# Patient Record
Sex: Male | Born: 1945 | Race: White | Hispanic: No | Marital: Married | Smoking: Former smoker
Health system: Southern US, Community
[De-identification: ages and names within clinical notes are randomized; demographics above are authoritative.]

## PROBLEM LIST (undated history)

## (undated) DIAGNOSIS — E119 Type 2 diabetes mellitus without complications: Secondary | ICD-10-CM

## (undated) DIAGNOSIS — Z862 Personal history of diseases of the blood and blood-forming organs and certain disorders involving the immune mechanism: Secondary | ICD-10-CM

## (undated) DIAGNOSIS — Z859 Personal history of malignant neoplasm, unspecified: Secondary | ICD-10-CM

## (undated) DIAGNOSIS — C189 Malignant neoplasm of colon, unspecified: Secondary | ICD-10-CM

## (undated) DIAGNOSIS — Z1509 Genetic susceptibility to other malignant neoplasm: Secondary | ICD-10-CM

## (undated) DIAGNOSIS — Z923 Personal history of irradiation: Secondary | ICD-10-CM

## (undated) DIAGNOSIS — K567 Ileus, unspecified: Secondary | ICD-10-CM

## (undated) DIAGNOSIS — I10 Essential (primary) hypertension: Secondary | ICD-10-CM

## (undated) DIAGNOSIS — D649 Anemia, unspecified: Secondary | ICD-10-CM

## (undated) DIAGNOSIS — Z85828 Personal history of other malignant neoplasm of skin: Secondary | ICD-10-CM

## (undated) DIAGNOSIS — Z85038 Personal history of other malignant neoplasm of large intestine: Secondary | ICD-10-CM

## (undated) DIAGNOSIS — C499 Malignant neoplasm of connective and soft tissue, unspecified: Secondary | ICD-10-CM

## (undated) DIAGNOSIS — L309 Dermatitis, unspecified: Secondary | ICD-10-CM

## (undated) DIAGNOSIS — M1711 Unilateral primary osteoarthritis, right knee: Secondary | ICD-10-CM

## (undated) DIAGNOSIS — C61 Malignant neoplasm of prostate: Secondary | ICD-10-CM

## (undated) DIAGNOSIS — Z9889 Other specified postprocedural states: Secondary | ICD-10-CM

## (undated) DIAGNOSIS — Z973 Presence of spectacles and contact lenses: Secondary | ICD-10-CM

## (undated) DIAGNOSIS — M1990 Unspecified osteoarthritis, unspecified site: Secondary | ICD-10-CM

## (undated) DIAGNOSIS — Z9289 Personal history of other medical treatment: Secondary | ICD-10-CM

## (undated) DIAGNOSIS — E039 Hypothyroidism, unspecified: Secondary | ICD-10-CM

## (undated) DIAGNOSIS — N529 Male erectile dysfunction, unspecified: Secondary | ICD-10-CM

## (undated) HISTORY — DX: Genetic susceptibility to other malignant neoplasm: Z15.09

## (undated) HISTORY — PX: TONSILLECTOMY: SUR1361

## (undated) HISTORY — PX: APPENDECTOMY: SHX54

## (undated) HISTORY — PX: PROSTATE SURGERY: SHX751

## (undated) HISTORY — PX: KNEE ARTHROSCOPY: SUR90

## (undated) HISTORY — DX: Malignant neoplasm of colon, unspecified: C18.9

---

## 1995-09-07 HISTORY — PX: RIGHT COLECTOMY: SHX853

## 1995-11-13 HISTORY — PX: SIGMOIDECTOMY: SHX176

## 1997-12-13 ENCOUNTER — Other Ambulatory Visit: Admission: RE | Admit: 1997-12-13 | Discharge: 1997-12-13 | Payer: Self-pay | Admitting: Gastroenterology

## 1997-12-22 ENCOUNTER — Other Ambulatory Visit: Admission: RE | Admit: 1997-12-22 | Discharge: 1997-12-22 | Payer: Self-pay | Admitting: General Surgery

## 1997-12-28 ENCOUNTER — Ambulatory Visit (HOSPITAL_COMMUNITY): Admission: RE | Admit: 1997-12-28 | Discharge: 1997-12-28 | Payer: Self-pay | Admitting: Gastroenterology

## 1997-12-29 HISTORY — PX: TOTAL COLECTOMY: SHX852

## 1998-01-11 ENCOUNTER — Ambulatory Visit (HOSPITAL_COMMUNITY): Admission: RE | Admit: 1998-01-11 | Discharge: 1998-01-11 | Payer: Self-pay | Admitting: General Surgery

## 1998-03-06 ENCOUNTER — Ambulatory Visit (HOSPITAL_BASED_OUTPATIENT_CLINIC_OR_DEPARTMENT_OTHER): Admission: RE | Admit: 1998-03-06 | Discharge: 1998-03-06 | Payer: Self-pay | Admitting: Plastic Surgery

## 1998-03-08 ENCOUNTER — Ambulatory Visit (HOSPITAL_BASED_OUTPATIENT_CLINIC_OR_DEPARTMENT_OTHER): Admission: RE | Admit: 1998-03-08 | Discharge: 1998-03-08 | Payer: Self-pay | Admitting: Plastic Surgery

## 1998-03-22 ENCOUNTER — Ambulatory Visit (HOSPITAL_BASED_OUTPATIENT_CLINIC_OR_DEPARTMENT_OTHER): Admission: RE | Admit: 1998-03-22 | Discharge: 1998-03-22 | Payer: Self-pay | Admitting: Plastic Surgery

## 1999-12-24 ENCOUNTER — Ambulatory Visit (HOSPITAL_BASED_OUTPATIENT_CLINIC_OR_DEPARTMENT_OTHER): Admission: RE | Admit: 1999-12-24 | Discharge: 1999-12-24 | Payer: Self-pay | Admitting: Orthopedic Surgery

## 2000-12-22 ENCOUNTER — Other Ambulatory Visit: Admission: RE | Admit: 2000-12-22 | Discharge: 2000-12-22 | Payer: Self-pay | Admitting: Gastroenterology

## 2006-05-06 ENCOUNTER — Ambulatory Visit: Admission: RE | Admit: 2006-05-06 | Discharge: 2006-10-23 | Payer: Self-pay | Admitting: Radiation Oncology

## 2006-05-26 HISTORY — PX: OTHER SURGICAL HISTORY: SHX169

## 2006-06-20 ENCOUNTER — Encounter: Admission: RE | Admit: 2006-06-20 | Discharge: 2006-06-20 | Payer: Self-pay | Admitting: Urology

## 2006-06-23 ENCOUNTER — Ambulatory Visit (HOSPITAL_BASED_OUTPATIENT_CLINIC_OR_DEPARTMENT_OTHER): Admission: RE | Admit: 2006-06-23 | Discharge: 2006-06-23 | Payer: Self-pay | Admitting: Urology

## 2013-07-05 ENCOUNTER — Encounter (HOSPITAL_COMMUNITY)
Admission: RE | Disposition: A | Discharge status: Home / Self Care | Payer: Self-pay | Source: Ambulatory Visit | Attending: Gastroenterology

## 2013-07-05 ENCOUNTER — Ambulatory Visit (HOSPITAL_COMMUNITY)
Admission: RE | Admit: 2013-07-05 | Discharge: 2013-07-05 | Disposition: A | Discharge status: Home / Self Care | Payer: Medicare Other | Source: Ambulatory Visit | Attending: Gastroenterology | Admitting: Gastroenterology

## 2013-07-05 DIAGNOSIS — Z9049 Acquired absence of other specified parts of digestive tract: Secondary | ICD-10-CM | POA: Insufficient documentation

## 2013-07-05 DIAGNOSIS — D509 Iron deficiency anemia, unspecified: Secondary | ICD-10-CM | POA: Insufficient documentation

## 2013-07-05 DIAGNOSIS — Z85038 Personal history of other malignant neoplasm of large intestine: Secondary | ICD-10-CM | POA: Insufficient documentation

## 2013-07-05 HISTORY — PX: GIVENS CAPSULE STUDY: SHX5432

## 2013-07-05 SURGERY — IMAGING PROCEDURE, GI TRACT, INTRALUMINAL, VIA CAPSULE
Anesthesia: LOCAL

## 2013-07-05 SURGICAL SUPPLY — 1 items: TOWEL COTTON PACK 4EA (MISCELLANEOUS) ×4 IMPLANT

## 2013-07-06 ENCOUNTER — Encounter (HOSPITAL_COMMUNITY): Payer: Self-pay | Admitting: Gastroenterology

## 2013-07-12 NOTE — H&P (Signed)
  Problem: Unexplained iron deficiency anemia.  History: The patient is a 67 year old male born [birth year] with unexplained iron deficiency anemia. He has undergone a subtotal colectomy for colon cancer. Surveillance flexible proctosigmoidoscopy, esophagogastroduodenoscopy, and small bowel biopsies were normal.  The patient is scheduled to undergo a diagnostic capsule endoscopy.  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 Type 2 diabetes mellitus. Primary hypothyroidism. Hypertension. Vertigo. Osteoarthritis. Eczema. Hypercholesterolemia. Benign prostatic hypertrophy. Prostate cancer. Carpal tunnel syndrome. Obstructive sleep apnea syndrome. Squamous cell skin cancer. Tonsillectomy. Appendectomy. Subtotal colectomy. Arthroscopic knee surgery.  Medication allergies: Lipitor and Zetia  Plan: Proceed with diagnostic capsule endoscopy to evaluate unexplained iron deficiency anemia.

## 2013-07-30 ENCOUNTER — Other Ambulatory Visit: Payer: Self-pay | Admitting: Urology

## 2013-08-03 ENCOUNTER — Other Ambulatory Visit: Payer: Self-pay | Admitting: Gastroenterology

## 2013-08-03 ENCOUNTER — Ambulatory Visit
Admission: RE | Admit: 2013-08-03 | Discharge: 2013-08-03 | Disposition: A | Discharge status: Home / Self Care | Payer: Medicare Other | Source: Ambulatory Visit | Attending: Gastroenterology | Admitting: Gastroenterology

## 2013-08-03 DIAGNOSIS — D509 Iron deficiency anemia, unspecified: Secondary | ICD-10-CM

## 2013-08-23 ENCOUNTER — Inpatient Hospital Stay (HOSPITAL_COMMUNITY): Admission: RE | Admit: 2013-08-23 | Payer: Medicare Other | Source: Ambulatory Visit

## 2013-09-23 ENCOUNTER — Encounter (HOSPITAL_COMMUNITY): Payer: Self-pay | Admitting: *Deleted

## 2013-09-24 ENCOUNTER — Encounter (HOSPITAL_BASED_OUTPATIENT_CLINIC_OR_DEPARTMENT_OTHER): Payer: Self-pay | Admitting: *Deleted

## 2013-09-24 NOTE — Progress Notes (Signed)
NPO AFTER MN. ARRIVE AT 0715. NEEDS ISTAT AND EKG. WILL TAKE LOTREL AND SYNTHROID AM DOS W/ SIPS OF WATER.

## 2013-09-26 DIAGNOSIS — Z9289 Personal history of other medical treatment: Secondary | ICD-10-CM

## 2013-09-26 HISTORY — DX: Personal history of other medical treatment: Z92.89

## 2013-10-01 ENCOUNTER — Encounter (HOSPITAL_BASED_OUTPATIENT_CLINIC_OR_DEPARTMENT_OTHER): Payer: Medicare Other | Admitting: Anesthesiology

## 2013-10-01 ENCOUNTER — Encounter (HOSPITAL_BASED_OUTPATIENT_CLINIC_OR_DEPARTMENT_OTHER): Payer: Self-pay | Admitting: *Deleted

## 2013-10-01 ENCOUNTER — Encounter (HOSPITAL_BASED_OUTPATIENT_CLINIC_OR_DEPARTMENT_OTHER)
Admission: RE | Disposition: A | Discharge status: Home / Self Care | Payer: Self-pay | Source: Ambulatory Visit | Attending: Urology

## 2013-10-01 ENCOUNTER — Ambulatory Visit (HOSPITAL_BASED_OUTPATIENT_CLINIC_OR_DEPARTMENT_OTHER)
Admission: RE | Admit: 2013-10-01 | Discharge: 2013-10-01 | Disposition: A | Discharge status: Home / Self Care | Payer: Medicare Other | Source: Ambulatory Visit | Attending: Urology | Admitting: Urology

## 2013-10-01 ENCOUNTER — Ambulatory Visit (HOSPITAL_BASED_OUTPATIENT_CLINIC_OR_DEPARTMENT_OTHER): Payer: Medicare Other | Admitting: Anesthesiology

## 2013-10-01 ENCOUNTER — Other Ambulatory Visit: Payer: Self-pay

## 2013-10-01 DIAGNOSIS — Z87891 Personal history of nicotine dependence: Secondary | ICD-10-CM | POA: Insufficient documentation

## 2013-10-01 DIAGNOSIS — E119 Type 2 diabetes mellitus without complications: Secondary | ICD-10-CM | POA: Insufficient documentation

## 2013-10-01 DIAGNOSIS — Z79899 Other long term (current) drug therapy: Secondary | ICD-10-CM | POA: Insufficient documentation

## 2013-10-01 DIAGNOSIS — I10 Essential (primary) hypertension: Secondary | ICD-10-CM | POA: Insufficient documentation

## 2013-10-01 DIAGNOSIS — N529 Male erectile dysfunction, unspecified: Secondary | ICD-10-CM | POA: Insufficient documentation

## 2013-10-01 DIAGNOSIS — C61 Malignant neoplasm of prostate: Secondary | ICD-10-CM | POA: Insufficient documentation

## 2013-10-01 DIAGNOSIS — E7800 Pure hypercholesterolemia, unspecified: Secondary | ICD-10-CM | POA: Insufficient documentation

## 2013-10-01 DIAGNOSIS — Z85038 Personal history of other malignant neoplasm of large intestine: Secondary | ICD-10-CM | POA: Insufficient documentation

## 2013-10-01 DIAGNOSIS — N361 Urethral diverticulum: Secondary | ICD-10-CM | POA: Insufficient documentation

## 2013-10-01 DIAGNOSIS — Z7982 Long term (current) use of aspirin: Secondary | ICD-10-CM | POA: Insufficient documentation

## 2013-10-01 HISTORY — DX: Other specified postprocedural states: Z98.890

## 2013-10-01 HISTORY — DX: Presence of spectacles and contact lenses: Z97.3

## 2013-10-01 HISTORY — DX: Personal history of malignant neoplasm, unspecified: Z85.9

## 2013-10-01 HISTORY — DX: Unspecified osteoarthritis, unspecified site: M19.90

## 2013-10-01 HISTORY — PX: CRYOABLATION: SHX1415

## 2013-10-01 HISTORY — DX: Personal history of diseases of the blood and blood-forming organs and certain disorders involving the immune mechanism: Z86.2

## 2013-10-01 LAB — GLUCOSE, CAPILLARY: Glucose-Capillary: 160 mg/dL — ABNORMAL HIGH (ref 70–99)

## 2013-10-01 SURGERY — CRYOABLATION, PROSTATE
Anesthesia: General | Site: Prostate

## 2013-10-01 MED ORDER — LIDOCAINE HCL (CARDIAC) 20 MG/ML IV SOLN
INTRAVENOUS | Status: DC | PRN
  Administered 2013-10-01: 80 mg via INTRAVENOUS

## 2013-10-01 MED ORDER — LACTATED RINGERS IV SOLN
INTRAVENOUS | Status: DC
  Administered 2013-10-01 (×2): via INTRAVENOUS
  Filled 2013-10-01: qty 1000

## 2013-10-01 MED ORDER — STERILE WATER FOR IRRIGATION IR SOLN
Status: DC | PRN
  Administered 2013-10-01: 3000 mL

## 2013-10-01 MED ORDER — BELLADONNA ALKALOIDS-OPIUM 16.2-60 MG RE SUPP
RECTAL | Status: DC | PRN
  Administered 2013-10-01: 1 via RECTAL

## 2013-10-01 MED ORDER — MIDAZOLAM HCL 2 MG/2ML IJ SOLN
INTRAMUSCULAR | Status: AC
  Filled 2013-10-01: qty 2

## 2013-10-01 MED ORDER — FENTANYL CITRATE 0.05 MG/ML IJ SOLN
INTRAMUSCULAR | Status: DC | PRN
  Administered 2013-10-01: 25 ug via INTRAVENOUS
  Administered 2013-10-01 (×3): 50 ug via INTRAVENOUS
  Administered 2013-10-01: 25 ug via INTRAVENOUS
  Administered 2013-10-01: 50 ug via INTRAVENOUS
  Administered 2013-10-01 (×2): 25 ug via INTRAVENOUS

## 2013-10-01 MED ORDER — KETOROLAC TROMETHAMINE 30 MG/ML IJ SOLN
INTRAMUSCULAR | Status: DC | PRN
  Administered 2013-10-01: 30 mg via INTRAVENOUS

## 2013-10-01 MED ORDER — BELLADONNA ALKALOIDS-OPIUM 16.2-60 MG RE SUPP
RECTAL | Status: AC
  Filled 2013-10-01: qty 1

## 2013-10-01 MED ORDER — ONDANSETRON HCL 4 MG/2ML IJ SOLN
INTRAMUSCULAR | Status: DC | PRN
  Administered 2013-10-01: 4 mg via INTRAVENOUS

## 2013-10-01 MED ORDER — EPHEDRINE SULFATE 50 MG/ML IJ SOLN
INTRAMUSCULAR | Status: DC | PRN
  Administered 2013-10-01 (×2): 10 mg via INTRAVENOUS

## 2013-10-01 MED ORDER — DEXAMETHASONE SODIUM PHOSPHATE 4 MG/ML IJ SOLN
INTRAMUSCULAR | Status: DC | PRN
  Administered 2013-10-01: 10 mg via INTRAVENOUS

## 2013-10-01 MED ORDER — TAMSULOSIN HCL 0.4 MG PO CAPS: 0.4000 mg | ORAL_CAPSULE | Freq: Every day | ORAL | Status: DC

## 2013-10-01 MED ORDER — FENTANYL CITRATE 0.05 MG/ML IJ SOLN
INTRAMUSCULAR | Status: AC
  Filled 2013-10-01: qty 6

## 2013-10-01 MED ORDER — PROMETHAZINE HCL 25 MG/ML IJ SOLN
6.2500 mg | INTRAMUSCULAR | Status: DC | PRN
  Filled 2013-10-01: qty 1

## 2013-10-01 MED ORDER — PROPOFOL 10 MG/ML IV BOLUS
INTRAVENOUS | Status: DC | PRN
  Administered 2013-10-01: 200 mg via INTRAVENOUS

## 2013-10-01 MED ORDER — CEFAZOLIN SODIUM-DEXTROSE 2-3 GM-% IV SOLR
2.0000 g | INTRAVENOUS | Status: AC
  Administered 2013-10-01: 2 g via INTRAVENOUS
  Filled 2013-10-01: qty 50

## 2013-10-01 MED ORDER — ACETAMINOPHEN 10 MG/ML IV SOLN
INTRAVENOUS | Status: DC | PRN
  Administered 2013-10-01: 1000 mg via INTRAVENOUS

## 2013-10-01 MED ORDER — HYDROMORPHONE HCL PF 1 MG/ML IJ SOLN
0.2500 mg | INTRAMUSCULAR | Status: DC | PRN
  Filled 2013-10-01: qty 1

## 2013-10-01 MED ORDER — OXYCODONE-ACETAMINOPHEN 5-325 MG PO TABS: 1.0000 | ORAL_TABLET | ORAL | Status: DC | PRN

## 2013-10-01 MED ORDER — KETOROLAC TROMETHAMINE 30 MG/ML IJ SOLN
15.0000 mg | Freq: Once | INTRAMUSCULAR | Status: DC | PRN
  Filled 2013-10-01: qty 1

## 2013-10-01 MED ORDER — OXYBUTYNIN CHLORIDE 5 MG PO TABS: ORAL_TABLET | ORAL | Status: DC

## 2013-10-01 MED ORDER — MIDAZOLAM HCL 5 MG/5ML IJ SOLN
INTRAMUSCULAR | Status: DC | PRN
  Administered 2013-10-01: 2 mg via INTRAVENOUS

## 2013-10-01 MED ORDER — TRIMETHOPRIM 100 MG PO TABS: 100.0000 mg | ORAL_TABLET | ORAL | Status: DC

## 2013-10-01 SURGICAL SUPPLY — 44 items
BAG URINE DRAINAGE (UROLOGICAL SUPPLIES) IMPLANT
BAG URINE LEG 19OZ MD ST LTX (BAG) IMPLANT
BLADE SURG ROTATE 9660 (MISCELLANEOUS) ×3 IMPLANT
BNDG CONFORM 3 STRL LF (GAUZE/BANDAGES/DRESSINGS) IMPLANT
BOOTIES KNEE HIGH SLOAN (MISCELLANEOUS) ×3 IMPLANT
CANISTER SUCTION 2500CC (MISCELLANEOUS) ×3 IMPLANT
CATH FOLEY 2WAY SLVR  5CC 18FR (CATHETERS) ×2
CATH FOLEY 2WAY SLVR 5CC 18FR (CATHETERS) ×1 IMPLANT
CHARGE TECH PROCEDURE ONCURA (LABOR (TRAVEL & OVERTIME)) ×3 IMPLANT
CLOTH BEACON ORANGE TIMEOUT ST (SAFETY) ×3 IMPLANT
COVER MAYO STAND STRL (DRAPES) ×3 IMPLANT
COVER TABLE BACK 60X90 (DRAPES) ×3 IMPLANT
DRAPE CAMERA CLOSED 9X96 (DRAPES) ×3 IMPLANT
DRAPE INCISE IOBAN 66X45 STRL (DRAPES) ×3 IMPLANT
DRAPE UNDERBUTTOCKS STRL (DRAPE) ×3 IMPLANT
DRSG TEGADERM 4X4.75 (GAUZE/BANDAGES/DRESSINGS) ×3 IMPLANT
DRSG TEGADERM 8X12 (GAUZE/BANDAGES/DRESSINGS) ×3 IMPLANT
GAS ARGON HIGH PRESSURE (MEDICAL GASES) ×3 IMPLANT
GAS HELIUM HIGH PRESSURE (MEDICAL GASES) ×3 IMPLANT
GLOVE BIO SURGEON STRL SZ7.5 (GLOVE) ×6 IMPLANT
GLOVE BIOGEL M 6.5 STRL (GLOVE) ×3 IMPLANT
GLOVE BIOGEL PI IND STRL 6.5 (GLOVE) ×1 IMPLANT
GLOVE BIOGEL PI IND STRL 7.5 (GLOVE) ×1 IMPLANT
GLOVE BIOGEL PI INDICATOR 6.5 (GLOVE) ×2
GLOVE BIOGEL PI INDICATOR 7.5 (GLOVE) ×2
GOWN STRL REUS W/ TWL LRG LVL3 (GOWN DISPOSABLE) ×1 IMPLANT
GOWN STRL REUS W/TWL LRG LVL3 (GOWN DISPOSABLE) ×2
GOWN STRL REUS W/TWL XL LVL3 (GOWN DISPOSABLE) ×3 IMPLANT
GUIDEWIRE SUPER STIFF (WIRE) ×3 IMPLANT
HOLDER FOLEY CATH W/STRAP (MISCELLANEOUS) ×3 IMPLANT
IV NS IRRIG 3000ML ARTHROMATIC (IV SOLUTION) ×3 IMPLANT
KIT PROSTATE PRESICE I (KITS) ×3 IMPLANT
NEEDLE SPNL 18GX3.5 QUINCKE PK (NEEDLE) IMPLANT
PACK BASIN DAY SURGERY FS (CUSTOM PROCEDURE TRAY) ×3 IMPLANT
PLUG CATH AND CAP STER (CATHETERS) ×3 IMPLANT
SET IRRIG Y TYPE TUR BLADDER L (SET/KITS/TRAYS/PACK) ×3 IMPLANT
SHEET LAVH (DRAPES) ×3 IMPLANT
SPONGE GAUZE 4X4 12PLY (GAUZE/BANDAGES/DRESSINGS) IMPLANT
SYRINGE 10CC LL (SYRINGE) ×3 IMPLANT
SYRINGE IRR TOOMEY STRL 70CC (SYRINGE) IMPLANT
TRAY DSU PREP LF (CUSTOM PROCEDURE TRAY) ×3 IMPLANT
UNDERPAD 30X30 INCONTINENT (UNDERPADS AND DIAPERS) ×3 IMPLANT
WATER STERILE IRR 3000ML UROMA (IV SOLUTION) ×3 IMPLANT
WATER STERILE IRR 500ML POUR (IV SOLUTION) ×3 IMPLANT

## 2013-10-01 NOTE — H&P (Signed)
Dean Owens is a 68 yo male patient of Dr. Cy Blamer, referred to discuss cryotherapy for hx of prostate cancer. He was originally diagnosed with an intermediate risk prostate cancer in the summer of 2007. The patient wound up undergoing seed implantation in October 2007. His Dosimetry looked extremely encouraging. Clinically he did well with some voiding symptoms that subsequently resolved. The patient's PSA nadired at a very acceptable 0.22 in November of 2011.     Since that time there has been a slow but steady increase in his PSA. Approximately a year ago, his PSA had crept up to 0.4. We reassessed things 6 months later and his PSA was up to 0.76. PSA checked in October 2014 was up to 1.26. Digital rectal exam continues to show a small prostate without worrisome induration or nodularity.    The patient's recent ultrasound confirmed a small prostate of 15 grams. Seeds were noted but there were no other obvious worrisome features. Seminal vesicles appeared fairly symmetric and there was no gross evidence of extension outside the prostate. The patient had biopsies of his right and left lobe of his prostate as well as the base of the seminal vesicles. All biopsies were negative except for 1 core on the left side, which revealed a Gleason 4+4=8 cancer.      He is currently is without any significant voiding complaints. The patient did have erectile dysfunction prior to seed implantation, which subsequently progressed. He has used some Cialis in the past with some improvement.   Past Medical History Problems  1. History of Colon Cancer (V10.05) 2. History of diabetes mellitus (V12.29) 3. History of hypercholesterolemia (V12.29) 4. History of hypertension (V12.59)  Surgical History Problems  1. History of Colon Surgery  Current Meds 1. Amlodipine Besy-Benazepril HCl - 10-20 MG Oral Capsule;  Therapy: 22Oct2012 to Recorded 2. Aspirin 81 MG Oral Tablet;  Therapy: (Recorded:08Jul2014) to  Recorded 3. Cialis 20 MG Oral Tablet; TAKE 1 TABLET As Directed;  Therapy: [id number] to (Last Rx:09Nov2010) Ordered 4. GlipiZIDE XL 5 MG Oral Tablet Extended Release 24 Hour;  Therapy: 22Nov2010 to Recorded 5. Levothyroxine Sodium 100 MCG Oral Tablet;  Therapy: [id number] to Recorded 6. Lomotil TABS;  Therapy: (Recorded:09Nov2010) to Recorded  Allergies Medication  1. No Known Drug Allergies  Family History Problems  1. Family history of Colon Cancer (V16.0) : Brother 2. Family history of Family Health Status Number Of Children : Brother  Social History Problems    Family history of Death In The Family Father   Family history of Death In The Family Mother   Former smoker (V15.82)   Marital History - Currently Married   Occupation:   Tobacco Use (V15.82)  Review of Systems Genitourinary, constitutional, skin, eye, otolaryngeal, hematologic/lymphatic, cardiovascular, pulmonary, endocrine, musculoskeletal, gastrointestinal, neurological and psychiatric system(s) were reviewed and pertinent findings if present are noted.  Genitourinary: erectile dysfunction, but no dysuria.  Musculoskeletal: joint pain and joint swelling.    Vitals Vital Signs [Data Includes: Last 1 Day]  Recorded: 02Dec2014 04:34PM  Blood Pressure: 125 / 72 Temperature: 98.8 F Heart Rate: 97  Physical Exam Constitutional: Well nourished and well developed . No acute distress.  ENT:. The ears and nose are normal in appearance.  Neck: The appearance of the neck is normal and no neck mass is present.  Pulmonary: No respiratory distress.  Cardiovascular:. No peripheral edema.  Abdomen: The abdomen is soft and nontender. No masses are palpated. No CVA tenderness. No hernias are palpable. No hepatosplenomegaly noted.  Rectal: Rectal exam demonstrates normal sphincter tone and no residual hemorrhoidal skin tags seen. Estimated prostate size is 1+. Normal rectal tone, no rectal masses, prostate is smooth,  symmetric and non-tender. The left seminal vesicle is nonpalpable. The right seminal vesicle is nonpalpable. The perineum is normal on inspection.  Genitourinary: Examination of the penis demonstrates no discharge, no masses, no lesions and a normal meatus. The penis is circumcised. The scrotum is without lesions. The right epididymis is palpably normal and non-tender. The left epididymis is palpably normal and non-tender. The right testis is non-tender and without masses. The left testis is non-tender and without masses.  Lymphatics: The femoral and inguinal nodes are not enlarged or tender.  Skin: Normal skin turgor, no visible rash and no visible skin lesions.  Neuro/Psych:. Mood and affect are appropriate.    Results/Data Selected Results  PSA 07Oct2014 02:12PM Rana Snare  SPECIMEN TYPE: BLOOD   Test Name Result Flag Reference  PSA 1.26 ng/mL  <=4.00  TEST METHODOLOGY: ECLIA PSA (ELECTROCHEMILUMINESCENCE IMMUNOASSAY)   PSA [id number] 11:46AM Rana Snare  SPECIMEN TYPE: BLOOD   Test Name Result Flag Reference  PSA 0.76 ng/mL  <=4.00  TEST METHODOLOGY: ECLIA PSA (ELECTROCHEMILUMINESCENCE IMMUNOASSAY)   PSA 20Dec2013 10:10AM Rana Snare  SPECIMEN TYPE: BLOOD   Test Name Result Flag Reference  PSA 0.45 ng/mL  <=4.00  Test Methodology: ECLIA PSA (Electrochemiluminescence Immunoassay)   Assessment 68 yo male with psa recurrent caP. 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 WE have reviewed his ultrasound together, and the physiology of the operation. In addition, we have discussed complications of ED, and incontinence, frequency and urgency. It will take 6-12 week for him to recover from his cryotherapy, because he will have sealed lymphatics, and must develop new ones to drain his prostate.    Current IPSS=8. We have discussed recto-urethral fistula, and complication of urinary retention. He will have catheter in place for approximately 2 days. he will need pain med, and possibly antispasmodic  medicine.   He will have knee surgery in January, and we will proceed with cryotherapy after he has recovered from that.   Plan Schedule cryotherapy for Febuary.   Discussion/Summary cc: Dr. Rana Snare   Signatures Electronically signed by : Carolan Clines, M.D.; Jul 27 2013  4:57PM EST

## 2013-10-01 NOTE — Interval H&P Note (Signed)
History and Physical Interval Note:  10/01/2013 8:25 AM  Dean Owens  has presented today for surgery, with the diagnosis of RECURRENT PROSTATE CA  The various methods of treatment have been discussed with the patient and family. After consideration of risks, benefits and other options for treatment, the patient has consented to  Procedure(s): CRYO ABLATION PROSTATE (N/A) as a surgical intervention .  The patient's history has been reviewed, patient examined, no change in status, stable for surgery.  I have reviewed the patient's chart and labs.  Questions were answered to the patient's satisfaction.     Carolan Clines I

## 2013-10-01 NOTE — Op Note (Signed)
Pre-operative diagnosis :   Recurrent adenocarcinoma prostate, Gleason 4+4, post iodine seed implantation 2007.  Postoperative diagnosis:  Same  Operation:  Cryotherapy prostate  Surgeon:  S. Gaynelle Arabian, MD  First assistant:  None  Anesthesia:  General LMA  Preparation:  After appropriate preanesthesia, the patient was brought to the operative room, placed on the operating table in the dorsal supine position where general LMA anesthesia was introduced. Dean Owens was then replaced in the dorsal lithotomy position with pubis was prepped with Betadine solution and draped in usual fashion. Armband was double checked. History was double checked.  Review history:  Dean Owens is a 68 yo diabetic male with a hx of colon cancer, and recurrent Ca prostate, presenting  to discuss cryotherapy for G 4+4 recurrent prostate cancer.  Dean Owens was originally diagnosed with an intermediate risk prostate cancer in the summer of 2007. The patient wound up undergoing seed implantation in October 2007. His Dosimetry looked extremely encouraging. Clinically Dean Owens did well with some voiding symptoms that subsequently resolved. The patient's PSA nadired at a very acceptable 0.22 in November of 2011.  Since that time there has been a slow but steady increase in his PSA. Approximately a year ago, his PSA had crept up to 0.4. We reassessed things 6 months later and his PSA was up to 0.76. PSA checked in October 2014 was up to 1.26. Digital rectal exam continues to show a small prostate without worrisome induration or nodularity.  The patient's recent ultrasound confirmed a small prostate of 15 grams. Seeds were noted but there were no other obvious worrisome features. Seminal vesicles appeared fairly symmetric and there was no gross evidence of extension outside the prostate. The patient had biopsies of his right and left lobe of his prostate as well as the base of the seminal vesicles. All biopsies were negative except for 1 core on the  left side, which revealed a Gleason 4+4=8 cancer.  Dean Owens is currently is without any significant voiding complaints. The patient did have erectile dysfunction prior to seed implantation, which subsequently progressed. Dean Owens has used some Cialis in the past with some improvement.      Statement of  Likelihood of Success: Excellent. TIME-OUT observed.:  Procedure:  The patient underwent prostate ultrasound for prostate size, and was eventually 20.9 cc gland. His prostate was halted the short, but wide, and was a regional case 4 ice rods placement. Foley catheter was placed without difficulty, and 10 cc of water were placed in the balloon. Under ultrasound control, the patient underwent placement of ice rods, and 5 separate rows. Separate temperature devices were placed in the fascia around the rectum, and the prostate. The Foley catheter was then removed, and flexible cystoscopy was accomplished, showing no evidence of rods in the urethra. There was no evidence of any blood or rods within the bladder neck or bladder. There was no evidence of any bladder stone tumor or bladder diverticulum. Within the urethra, at the level of the Veru, on the right side, there was a 3 mm blind-ending urethral diverticulum noted. This was not probed. There was no bleeding. This appeared to be an insignificant variation of the patient's urethral mucosa.   With the flexible cystoscope within the bladder, a floppy-tipped wire was placed through the scope, and coiled in the bladder. The scope was removed, and the urethral warming device was placed, and secured in place with a towel and clamp. The warming device was left in place and activated, until 20 minutes after the  procedure was finished.   The patient then underwent 2 cycles of freezing and thawing, to negative 40C, with active thaw process during the first cycle using helium, and both active and passive thaw process during the second cycle.  After the rods were thawed, they  were removed. Sterile dressing was applied. After 20 minutes, the warming device was removed, and Foley catheter was placed to straight drainage.  The patient received IV antibiotic, IV Toradol, IV Tylenol. Dean Owens was awakened, and taken to recovery room in good condition.  The patient then underwent 2 separate freeze- thaw cycles of the prostate. The first freeze-thaw cycle was accomplished using argon, and helium. Freezing was accomplished 2-40, with care taken to avoid injury to the rectum or the urethra.

## 2013-10-01 NOTE — Anesthesia Procedure Notes (Signed)
Procedure Name: LMA Insertion Date/Time: 10/01/2013 8:48 AM Performed by: Denna Haggard D Pre-anesthesia Checklist: Patient identified, Emergency Drugs available, Suction available and Patient being monitored Patient Re-evaluated:Patient Re-evaluated prior to inductionOxygen Delivery Method: Circle System Utilized Preoxygenation: Pre-oxygenation with 100% oxygen Intubation Type: IV induction Ventilation: Mask ventilation without difficulty LMA: LMA inserted LMA Size: 4.0 Number of attempts: 1 Airway Equipment and Method: bite block Placement Confirmation: positive ETCO2 Tube secured with: Tape Dental Injury: Teeth and Oropharynx as per pre-operative assessment

## 2013-10-01 NOTE — Discharge Instructions (Addendum)
INSTRUCTIONS AFTER CRYOABLATION OF THE PROSTATE  Normal Findings After Cryoablation:   You may experience a number of symptoms after the cryoablation which occur in  some patients.  There is no cause for alarm should this happen.  These    symptoms include:  -Blood in the urine.  When you are discharged after your surgery, your urine will have some blood in it and may appear red.  This occurs to some degree in all  patients after cryoablation and is not cause for alarm.  Your urine should clear approximately 24 hours after the procedure, but may persist for quite a while longer.  -Scrotal and penile swelling and bruising.  This occurs about 2-3 days after the cryoablation and is caused by tissue swelling which temporarily blocks the drainage of lymph.  This is painless and resolves in less than one week.  Ice packs and lying down for short periods of time during the day will improve the swelling.  -Small amounts of bloody discharge from the end of your penis.  This can   occur for up to 6 weeks after the procedure and is not cause for alarm.  It is due   to some discharge from the urethra in the area of the prostate.  -Some numbness in the head of the penis.  Occasionally, when a large   amount of freezing has been performed during the procedure, the nerve which   supplies sensation to one or both sides of the head of the penis may be affected.   The sensation returns after a number of months.  Call your doctor at [phone] if any of the following occur:   -If you have any pain, fever, or chills.  -If your foley catheter or suprapubic tube is not draining urine.  -If there is decreasing urinary stream.  This may indicate sloughing of some   dead tissue in the area of the prostate near the opening to the bladder.  It may   clear on its own but,  if the problem becomes severe, it may require removal of   the dead tissue through a cystoscope.  -If you have diarrhea after urination or foul-smelling  urine.  These    symptoms may indicate an urethrorectal fistula, which is a hole between the   bladder channel and the rectum.  This should be investigated by your doctor.  -If you have any questions or problems.   Diet:  Resume your normal diet.   If you become constipated, you may try    over-the-counter remedies such as Milk of Magnesia.  If you are nauseated,   vomiting or feel bloated, notify your doctor's office.  DO NOT GIVE YOURSELF  ANY ENEMAS OR RECTAL MEDICATIONS.  THE RECTAL WALL IS THIN  AFTER CRYOABLATION.  Activity:   -You may have swelling and bruising of the penis and scrotum.  Apply ice packs   to the area behind the scrotum intermittently for 24-48 hours after your surgery to   keep the swelling down.  Wearing an athletic supporter (jock strap) might also   help.  -Lying flat on your back will also help decrease the swelling.  You may find   when you are sitting up or walking around that the swelling increases.   -You will have  puncture wounds behind your scrotum making it painful to sit   down.  Use a hemorrhoid donut to sit on if needed.  -You may shower on the second day after surgery.  -  There are no lifting or driving restrictions.  Use caution while the suprapubic tube   is in place.   Medications:  -You may resume your preoperative medications, except aspirin and other blood   thinning agents.  Unless otherwise instructed, resume your aspirin after your   suprapubic tube is removed.  If you are taking Coumadin or other blood thinners,   please discuss when to restart these medications with your surgeon.  -Unless otherwise ordered, you will be given prescriptions for the following   medications which you will need to take after your procedure:   *Antibiotics -  to prevent infection while your suprapubic tube is in place.   *Anti-inflammatory - to prevent pain and to decrease the inflammation in    the area of the prostate.  You may take ibuprofen (Motrin, Advil),          acetaminophen (Tylenol) or naproxen (Aleve) as needed for discomfort.   Call your doctor's office at [phone] for an appointment  For foley catheter removal. .  Patient Signature:  ________________________________________________________  Nurse's Signature:  ________________________________________________________    Post Anesthesia Home Care Instructions  Activity: Get plenty of rest for the remainder of the day. A responsible adult should stay with you for 24 hours following the procedure.  For the next 24 hours, DO NOT: -Drive a car -Paediatric nurse -Drink alcoholic beverages -Take any medication unless instructed by your physician -Make any legal decisions or sign important papers.  Meals: Start with liquid foods such as gelatin or soup. Progress to regular foods as tolerated. Avoid greasy, spicy, heavy foods. If nausea and/or vomiting occur, drink only clear liquids until the nausea and/or vomiting subsides. Call your physician if vomiting continues.  Special Instructions/Symptoms: Your throat may feel dry or sore from the anesthesia or the breathing tube placed in your throat during surgery. If this causes discomfort, gargle with warm salt water. The discomfort should disappear within 24 hours.

## 2013-10-01 NOTE — Anesthesia Preprocedure Evaluation (Addendum)
Anesthesia Evaluation  Patient identified by MRN, date of birth, ID band Patient awake    Reviewed: Allergy & Precautions, H&P , NPO status , Patient's Chart, lab work & pertinent test results  Airway Mallampati: II TM Distance: >3 FB Neck ROM: Full    Dental no notable dental hx.    Pulmonary former smoker,  breath sounds clear to auscultation  Pulmonary exam normal       Cardiovascular hypertension, Pt. on medications Rhythm:Regular Rate:Normal     Neuro/Psych negative neurological ROS  negative psych ROS   GI/Hepatic negative GI ROS, Neg liver ROS,   Endo/Other  diabetes, Oral Hypoglycemic AgentsHypothyroidism   Renal/GU negative Renal ROS  negative genitourinary   Musculoskeletal negative musculoskeletal ROS (+)   Abdominal   Peds negative pediatric ROS (+)  Hematology negative hematology ROS (+)   Anesthesia Other Findings   Reproductive/Obstetrics negative OB ROS                         Anesthesia Physical Anesthesia Plan  ASA: II  Anesthesia Plan: General   Post-op Pain Management:    Induction: Intravenous  Airway Management Planned: LMA  Additional Equipment:   Intra-op Plan:   Post-operative Plan:   Informed Consent: I have reviewed the patients History and Physical, chart, labs and discussed the procedure including the risks, benefits and alternatives for the proposed anesthesia with the patient or authorized representative who has indicated his/her understanding and acceptance.   Dental advisory given  Plan Discussed with: CRNA and Surgeon  Anesthesia Plan Comments:         Anesthesia Quick Evaluation

## 2013-10-01 NOTE — Anesthesia Postprocedure Evaluation (Signed)
  Anesthesia Post-op Note  Patient: Dean Owens  Procedure(s) Performed: Procedure(s) (LRB): CRYO ABLATION PROSTATE (N/A)  Patient Location: PACU  Anesthesia Type: General  Level of Consciousness: awake and alert   Airway and Oxygen Therapy: Patient Spontanous Breathing  Post-op Pain: mild  Post-op Assessment: Post-op Vital signs reviewed, Patient's Cardiovascular Status Stable, Respiratory Function Stable, Patent Airway and No signs of Nausea or vomiting  Last Vitals:  Filed Vitals:   10/01/13 1013  BP: 126/62  Pulse: 111  Temp: 36.3 C  Resp: 26    Post-op Vital Signs: stable   Complications: No apparent anesthesia complications

## 2013-10-01 NOTE — Transfer of Care (Signed)
Immediate Anesthesia Transfer of Care Note  Patient: Dean Owens  Procedure(s) Performed: Procedure(s) (LRB): CRYO ABLATION PROSTATE (N/A)  Patient Location: PACU  Anesthesia Type: General  Level of Consciousness: awake, oriented, sedated and patient cooperative  Airway & Oxygen Therapy: Patient Spontanous Breathing and Patient connected to face mask oxygen  Post-op Assessment: Report given to PACU RN and Post -op Vital signs reviewed and stable  Post vital signs: Reviewed and stable  Complications: No apparent anesthesia complications

## 2013-10-04 ENCOUNTER — Encounter (HOSPITAL_BASED_OUTPATIENT_CLINIC_OR_DEPARTMENT_OTHER): Payer: Self-pay | Admitting: Urology

## 2013-10-04 LAB — POCT I-STAT 4, (NA,K, GLUC, HGB,HCT)
GLUCOSE: 134 mg/dL — AB (ref 70–99)
HCT: 30 % — ABNORMAL LOW (ref 39.0–52.0)
HEMOGLOBIN: 10.2 g/dL — AB (ref 13.0–17.0)
Potassium: 4.1 mEq/L (ref 3.7–5.3)
Sodium: 134 mEq/L — ABNORMAL LOW (ref 137–147)

## 2013-10-11 ENCOUNTER — Other Ambulatory Visit: Payer: Self-pay | Admitting: Gastroenterology

## 2013-10-11 ENCOUNTER — Ambulatory Visit
Admission: RE | Admit: 2013-10-11 | Discharge: 2013-10-11 | Disposition: A | Discharge status: Home / Self Care | Payer: Medicare Other | Source: Ambulatory Visit | Attending: Gastroenterology | Admitting: Gastroenterology

## 2013-10-11 DIAGNOSIS — D128 Benign neoplasm of rectum: Secondary | ICD-10-CM

## 2013-10-12 ENCOUNTER — Encounter (HOSPITAL_COMMUNITY): Payer: Self-pay | Admitting: Emergency Medicine

## 2013-10-12 ENCOUNTER — Emergency Department (HOSPITAL_COMMUNITY): Payer: Medicare Other

## 2013-10-12 ENCOUNTER — Inpatient Hospital Stay (HOSPITAL_COMMUNITY)
Admission: EM | Admit: 2013-10-12 | Discharge: 2013-10-22 | DRG: 329 | Disposition: A | Discharge status: Home Health | Payer: Medicare Other | Attending: General Surgery | Admitting: General Surgery

## 2013-10-12 DIAGNOSIS — K565 Intestinal adhesions [bands], unspecified as to partial versus complete obstruction: Secondary | ICD-10-CM | POA: Diagnosis present

## 2013-10-12 DIAGNOSIS — Z79899 Other long term (current) drug therapy: Secondary | ICD-10-CM

## 2013-10-12 DIAGNOSIS — Y836 Removal of other organ (partial) (total) as the cause of abnormal reaction of the patient, or of later complication, without mention of misadventure at the time of the procedure: Secondary | ICD-10-CM | POA: Diagnosis not present

## 2013-10-12 DIAGNOSIS — C189 Malignant neoplasm of colon, unspecified: Secondary | ICD-10-CM

## 2013-10-12 DIAGNOSIS — C772 Secondary and unspecified malignant neoplasm of intra-abdominal lymph nodes: Secondary | ICD-10-CM | POA: Diagnosis present

## 2013-10-12 DIAGNOSIS — C61 Malignant neoplasm of prostate: Secondary | ICD-10-CM | POA: Diagnosis present

## 2013-10-12 DIAGNOSIS — K560 Paralytic ileus: Secondary | ICD-10-CM | POA: Diagnosis not present

## 2013-10-12 DIAGNOSIS — C171 Malignant neoplasm of jejunum: Principal | ICD-10-CM | POA: Diagnosis present

## 2013-10-12 DIAGNOSIS — R1900 Intra-abdominal and pelvic swelling, mass and lump, unspecified site: Secondary | ICD-10-CM

## 2013-10-12 DIAGNOSIS — E860 Dehydration: Secondary | ICD-10-CM | POA: Diagnosis present

## 2013-10-12 DIAGNOSIS — E43 Unspecified severe protein-calorie malnutrition: Secondary | ICD-10-CM | POA: Diagnosis present

## 2013-10-12 DIAGNOSIS — D500 Iron deficiency anemia secondary to blood loss (chronic): Secondary | ICD-10-CM | POA: Diagnosis present

## 2013-10-12 DIAGNOSIS — K9189 Other postprocedural complications and disorders of digestive system: Secondary | ICD-10-CM

## 2013-10-12 DIAGNOSIS — M1710 Unilateral primary osteoarthritis, unspecified knee: Secondary | ICD-10-CM | POA: Diagnosis present

## 2013-10-12 DIAGNOSIS — K56609 Unspecified intestinal obstruction, unspecified as to partial versus complete obstruction: Secondary | ICD-10-CM

## 2013-10-12 DIAGNOSIS — Z9221 Personal history of antineoplastic chemotherapy: Secondary | ICD-10-CM

## 2013-10-12 DIAGNOSIS — D490 Neoplasm of unspecified behavior of digestive system: Secondary | ICD-10-CM | POA: Diagnosis present

## 2013-10-12 DIAGNOSIS — Z800 Family history of malignant neoplasm of digestive organs: Secondary | ICD-10-CM

## 2013-10-12 DIAGNOSIS — Z87891 Personal history of nicotine dependence: Secondary | ICD-10-CM

## 2013-10-12 DIAGNOSIS — Z1889 Other specified retained foreign body fragments: Secondary | ICD-10-CM

## 2013-10-12 DIAGNOSIS — R1909 Other intra-abdominal and pelvic swelling, mass and lump: Secondary | ICD-10-CM | POA: Diagnosis present

## 2013-10-12 DIAGNOSIS — Z801 Family history of malignant neoplasm of trachea, bronchus and lung: Secondary | ICD-10-CM

## 2013-10-12 DIAGNOSIS — Z9089 Acquired absence of other organs: Secondary | ICD-10-CM

## 2013-10-12 DIAGNOSIS — Y9210 Unspecified residential institution as the place of occurrence of the external cause: Secondary | ICD-10-CM | POA: Diagnosis not present

## 2013-10-12 DIAGNOSIS — K929 Disease of digestive system, unspecified: Secondary | ICD-10-CM | POA: Diagnosis not present

## 2013-10-12 DIAGNOSIS — I10 Essential (primary) hypertension: Secondary | ICD-10-CM | POA: Diagnosis present

## 2013-10-12 DIAGNOSIS — E785 Hyperlipidemia, unspecified: Secondary | ICD-10-CM | POA: Diagnosis present

## 2013-10-12 DIAGNOSIS — N179 Acute kidney failure, unspecified: Secondary | ICD-10-CM | POA: Diagnosis not present

## 2013-10-12 DIAGNOSIS — Z9049 Acquired absence of other specified parts of digestive tract: Secondary | ICD-10-CM

## 2013-10-12 DIAGNOSIS — D62 Acute posthemorrhagic anemia: Secondary | ICD-10-CM | POA: Diagnosis present

## 2013-10-12 DIAGNOSIS — E119 Type 2 diabetes mellitus without complications: Secondary | ICD-10-CM | POA: Diagnosis present

## 2013-10-12 DIAGNOSIS — N289 Disorder of kidney and ureter, unspecified: Secondary | ICD-10-CM | POA: Diagnosis present

## 2013-10-12 DIAGNOSIS — L259 Unspecified contact dermatitis, unspecified cause: Secondary | ICD-10-CM | POA: Diagnosis present

## 2013-10-12 DIAGNOSIS — E039 Hypothyroidism, unspecified: Secondary | ICD-10-CM | POA: Diagnosis present

## 2013-10-12 DIAGNOSIS — K567 Ileus, unspecified: Secondary | ICD-10-CM | POA: Diagnosis not present

## 2013-10-12 DIAGNOSIS — Z85038 Personal history of other malignant neoplasm of large intestine: Secondary | ICD-10-CM

## 2013-10-12 DIAGNOSIS — N529 Male erectile dysfunction, unspecified: Secondary | ICD-10-CM | POA: Diagnosis present

## 2013-10-12 DIAGNOSIS — R112 Nausea with vomiting, unspecified: Secondary | ICD-10-CM

## 2013-10-12 HISTORY — DX: Type 2 diabetes mellitus without complications: E11.9

## 2013-10-12 HISTORY — DX: Hypothyroidism, unspecified: E03.9

## 2013-10-12 HISTORY — DX: Essential (primary) hypertension: I10

## 2013-10-12 LAB — COMPREHENSIVE METABOLIC PANEL
ALBUMIN: 3.5 g/dL (ref 3.5–5.2)
ALT: 18 U/L (ref 0–53)
AST: 28 U/L (ref 0–37)
Alkaline Phosphatase: 128 U/L — ABNORMAL HIGH (ref 39–117)
BUN: 35 mg/dL — AB (ref 6–23)
CO2: 26 mEq/L (ref 19–32)
CREATININE: 2.8 mg/dL — AB (ref 0.50–1.35)
Calcium: 9.8 mg/dL (ref 8.4–10.5)
Chloride: 88 mEq/L — ABNORMAL LOW (ref 96–112)
GFR calc Af Amer: 25 mL/min — ABNORMAL LOW (ref >90)
GFR calc non Af Amer: 22 mL/min — ABNORMAL LOW (ref >90)
Glucose, Bld: 198 mg/dL — ABNORMAL HIGH (ref 70–99)
Potassium: 4.7 mEq/L (ref 3.7–5.3)
Sodium: 133 mEq/L — ABNORMAL LOW (ref 137–147)
Total Bilirubin: 0.3 mg/dL (ref 0.3–1.2)
Total Protein: 7.6 g/dL (ref 6.0–8.3)

## 2013-10-12 LAB — URINE MICROSCOPIC-ADD ON

## 2013-10-12 LAB — URINALYSIS, ROUTINE W REFLEX MICROSCOPIC
Bilirubin Urine: NEGATIVE
Glucose, UA: NEGATIVE mg/dL
Hgb urine dipstick: NEGATIVE
Ketones, ur: NEGATIVE mg/dL
LEUKOCYTES UA: NEGATIVE
NITRITE: NEGATIVE
PH: 7 (ref 5.0–8.0)
Protein, ur: 30 mg/dL — AB
SPECIFIC GRAVITY, URINE: 1.018 (ref 1.005–1.030)
Urobilinogen, UA: 0.2 mg/dL (ref 0.0–1.0)

## 2013-10-12 LAB — CBC WITH DIFFERENTIAL/PLATELET
BASOS PCT: 0 % (ref 0–1)
Basophils Absolute: 0 10*3/uL (ref 0.0–0.1)
EOS PCT: 1 % (ref 0–5)
Eosinophils Absolute: 0.2 10*3/uL (ref 0.0–0.7)
HCT: 29.2 % — ABNORMAL LOW (ref 39.0–52.0)
HEMOGLOBIN: 9 g/dL — AB (ref 13.0–17.0)
LYMPHS PCT: 10 % — AB (ref 12–46)
Lymphs Abs: 1.6 10*3/uL (ref 0.7–4.0)
MCH: 21.4 pg — ABNORMAL LOW (ref 26.0–34.0)
MCHC: 30.8 g/dL (ref 30.0–36.0)
MCV: 69.4 fL — ABNORMAL LOW (ref 78.0–100.0)
MONOS PCT: 9 % (ref 3–12)
Monocytes Absolute: 1.5 10*3/uL — ABNORMAL HIGH (ref 0.1–1.0)
Neutro Abs: 13.1 10*3/uL — ABNORMAL HIGH (ref 1.7–7.7)
Neutrophils Relative %: 80 % — ABNORMAL HIGH (ref 43–77)
Platelets: 535 10*3/uL — ABNORMAL HIGH (ref 150–400)
RBC: 4.21 MIL/uL — AB (ref 4.22–5.81)
RDW: 17.9 % — ABNORMAL HIGH (ref 11.5–15.5)
WBC: 16.4 10*3/uL — ABNORMAL HIGH (ref 4.0–10.5)

## 2013-10-12 LAB — PROTIME-INR
INR: 1.14 (ref 0.00–1.49)
PROTHROMBIN TIME: 14.4 s (ref 11.6–15.2)

## 2013-10-12 LAB — LIPASE, BLOOD: LIPASE: 42 U/L (ref 11–59)

## 2013-10-12 LAB — GLUCOSE, CAPILLARY: Glucose-Capillary: 107 mg/dL — ABNORMAL HIGH (ref 70–99)

## 2013-10-12 MED ORDER — INSULIN ASPART 100 UNIT/ML ~~LOC~~ SOLN
0.0000 [IU] | SUBCUTANEOUS | Status: DC
  Administered 2013-10-13: 1 [IU] via SUBCUTANEOUS
  Administered 2013-10-13 (×2): 2 [IU] via SUBCUTANEOUS
  Administered 2013-10-13: 1 [IU] via SUBCUTANEOUS
  Administered 2013-10-13: 2 [IU] via SUBCUTANEOUS
  Administered 2013-10-13 (×2): 1 [IU] via SUBCUTANEOUS
  Administered 2013-10-14: 3 [IU] via SUBCUTANEOUS
  Administered 2013-10-14 (×2): 2 [IU] via SUBCUTANEOUS
  Administered 2013-10-14: 5 [IU] via SUBCUTANEOUS
  Administered 2013-10-15: 2 [IU] via SUBCUTANEOUS
  Administered 2013-10-15: 3 [IU] via SUBCUTANEOUS
  Administered 2013-10-15: 1 [IU] via SUBCUTANEOUS
  Administered 2013-10-15: 2 [IU] via SUBCUTANEOUS
  Administered 2013-10-15: 3 [IU] via SUBCUTANEOUS
  Administered 2013-10-15: 2 [IU] via SUBCUTANEOUS
  Administered 2013-10-16 (×3): 3 [IU] via SUBCUTANEOUS
  Administered 2013-10-16 (×2): 2 [IU] via SUBCUTANEOUS
  Administered 2013-10-16: 3 [IU] via SUBCUTANEOUS
  Administered 2013-10-17 (×6): 2 [IU] via SUBCUTANEOUS
  Administered 2013-10-18: 3 [IU] via SUBCUTANEOUS
  Administered 2013-10-18 (×2): 2 [IU] via SUBCUTANEOUS

## 2013-10-12 MED ORDER — ONDANSETRON HCL 4 MG/2ML IJ SOLN: 4.0000 mg | Freq: Four times a day (QID) | INTRAMUSCULAR | Status: DC | PRN

## 2013-10-12 MED ORDER — FENTANYL CITRATE 0.05 MG/ML IJ SOLN
100.0000 ug | Freq: Once | INTRAMUSCULAR | Status: AC
  Administered 2013-10-12: 100 ug via INTRAVENOUS
  Filled 2013-10-12: qty 2

## 2013-10-12 MED ORDER — PANTOPRAZOLE SODIUM 40 MG IV SOLR
40.0000 mg | Freq: Every day | INTRAVENOUS | Status: DC
  Administered 2013-10-12 – 2013-10-19 (×8): 40 mg via INTRAVENOUS
  Filled 2013-10-12 (×10): qty 40

## 2013-10-12 MED ORDER — CHLORHEXIDINE GLUCONATE 0.12 % MT SOLN
15.0000 mL | Freq: Two times a day (BID) | OROMUCOSAL | Status: DC
  Administered 2013-10-13 – 2013-10-21 (×15): 15 mL via OROMUCOSAL
  Filled 2013-10-12 (×13): qty 15

## 2013-10-12 MED ORDER — BIOTENE DRY MOUTH MT LIQD
15.0000 mL | Freq: Two times a day (BID) | OROMUCOSAL | Status: DC
  Administered 2013-10-13 – 2013-10-21 (×12): 15 mL via OROMUCOSAL

## 2013-10-12 MED ORDER — ONDANSETRON HCL 4 MG/2ML IJ SOLN
4.0000 mg | Freq: Once | INTRAMUSCULAR | Status: AC
  Administered 2013-10-12: 4 mg via INTRAVENOUS
  Filled 2013-10-12 (×2): qty 2

## 2013-10-12 MED ORDER — DEXTROSE-NACL 5-0.45 % IV SOLN
INTRAVENOUS | Status: AC
  Administered 2013-10-12 – 2013-10-14 (×4): via INTRAVENOUS

## 2013-10-12 MED ORDER — IOHEXOL 300 MG/ML  SOLN
25.0000 mL | INTRAMUSCULAR | Status: AC
  Administered 2013-10-12 (×2): 25 mL via ORAL

## 2013-10-12 MED ORDER — LEVOTHYROXINE SODIUM 100 MCG IV SOLR
25.0000 ug | Freq: Every day | INTRAVENOUS | Status: DC
  Administered 2013-10-13 – 2013-10-19 (×7): 25 ug via INTRAVENOUS
  Filled 2013-10-12 (×8): qty 5

## 2013-10-12 MED ORDER — MORPHINE SULFATE 2 MG/ML IJ SOLN
2.0000 mg | INTRAMUSCULAR | Status: DC | PRN
  Administered 2013-10-13: 2 mg via INTRAVENOUS
  Administered 2013-10-13 (×2): 4 mg via INTRAVENOUS
  Administered 2013-10-14: 2 mg via INTRAVENOUS
  Administered 2013-10-14: 4 mg via INTRAVENOUS
  Administered 2013-10-19: 2 mg via INTRAVENOUS
  Filled 2013-10-12: qty 2
  Filled 2013-10-12 (×2): qty 1
  Filled 2013-10-12 (×2): qty 2
  Filled 2013-10-12: qty 1

## 2013-10-12 MED ORDER — SODIUM CHLORIDE 0.9 % IV BOLUS (SEPSIS)
500.0000 mL | Freq: Once | INTRAVENOUS | Status: AC
  Administered 2013-10-12: 500 mL via INTRAVENOUS

## 2013-10-12 NOTE — ED Provider Notes (Signed)
CSN: [id number]     Arrival date & time 10/12/13  1348 History   First MD Initiated Contact with Patient 10/12/13 1357     Chief Complaint  Patient presents with  . Abdominal Pain     (Consider location/radiation/quality/duration/timing/severity/associated sxs/prior Treatment) Patient is a 68 y.o. male presenting with abdominal pain. The history is provided by the patient.  Abdominal Pain Associated symptoms: diarrhea, nausea and vomiting   Associated symptoms: no chest pain and no shortness of breath    patient has had nausea and vomiting with some diarrhea for the last few days. Also is abdominal pain with some swelling. States pain is worse on his left side. He's had 3 previous colectomies for carcinoma. Also had recent treatment for recurrent prostate cancer. Also had endoscopic pill that has not passed it. X-ray done yesterday showed a left lower quadrant. No fevers. He states he feels dehydrated. He has had some vomiting also.  Past Medical History  Diagnosis Date  . Hypertension   . Hyperlipidemia   . Type 2 diabetes mellitus   . Hypothyroidism   . History of colon cancer NO RECURRENCE    S/P COLECTOMY X3  LAST ONE 1998--  [place]  . Recurrent prostate carcinoma FOLLOWED BY DR Gaynelle Arabian    DX 2007  S/P RADIOACTIVE SEED IMPLANTS  . ED (erectile dysfunction) of organic origin   . Right knee DJD   . Eczema   . OA (osteoarthritis)     LEFT KNEE  . Wears glasses   . History of iron deficiency anemia   . History of squamous cell carcinoma excision    Past Surgical History  Procedure Laterality Date  . Givens capsule study N/A 07/05/2013    Procedure: GIVENS CAPSULE STUDY;  Surgeon: [name], MD;  Location: [place];  Service: Endoscopy;  Laterality: N/A;  . Radioactive prostate seed implants  OCT 2007  . Knee arthroscopy Bilateral 2006 &  2007  . Tonsillectomy  AS CHILD  . Appendectomy  AGE 25  . Colon surgery  X3   LAST ONE 1998 TOTAL COLECTOMY  .  Cryoablation N/A 10/01/2013    Procedure: CRYO ABLATION PROSTATE;  Surgeon: [name], MD;  Location: [facility];  Service: Urology;  Laterality: N/A;   History reviewed. No pertinent family history. History  Substance Use Topics  . Smoking status: Former Smoker -- 0.50 packs/day for 15 years    Types: Cigarettes    Quit date: 09/24/1998  . Smokeless tobacco: Never Used  . Alcohol Use: Yes     Comment: OCCASIONAL    Review of Systems  Constitutional: Negative for activity change and appetite change.  Eyes: Negative for pain.  Respiratory: Negative for chest tightness and shortness of breath.   Cardiovascular: Negative for chest pain and leg swelling.  Gastrointestinal: Positive for nausea, vomiting, abdominal pain and diarrhea.  Genitourinary: Negative for flank pain.  Musculoskeletal: Negative for back pain and neck stiffness.  Skin: Negative for rash.  Neurological: Negative for weakness, numbness and headaches.  Psychiatric/Behavioral: Negative for behavioral problems.      Allergies  Review of patient's allergies indicates no known allergies.  Home Medications   Current Outpatient Rx  Name  Route  Sig  Dispense  Refill  . amLODipine-benazepril (LOTREL) 5-10 MG per capsule   Oral   Take 1 capsule by mouth every morning.         [name] glipiZIDE (GLUCOTROL) 5 MG tablet   Oral  Take 5 mg by mouth daily before breakfast.         . levothyroxine (SYNTHROID, LEVOTHROID) 50 MCG tablet   Oral   Take 50 mcg by mouth daily before breakfast.         . trimethoprim (TRIMPEX) 100 MG tablet   Oral   Take 1 tablet (100 mg total) by mouth 1 day or 1 dose.   30 tablet   1    BP 143/63  Pulse 75  Temp(Src) 98 F (36.7 C)  Resp 20  Wt 210 lb (95.255 kg)  SpO2 100% Physical Exam  Nursing note and vitals reviewed. Constitutional: He is oriented to person, place, and time. He appears well-developed and well-nourished.  HENT:  Head:  Normocephalic and atraumatic.  Eyes: EOM are normal. Pupils are equal, round, and reactive to light.  Neck: Normal range of motion. Neck supple.  Cardiovascular: Normal rate, regular rhythm and normal heart sounds.   No murmur heard. Pulmonary/Chest: Effort normal and breath sounds normal.  Abdominal: Soft. Bowel sounds are normal. He exhibits no distension and no mass. There is tenderness. There is no rebound and no guarding.  Mild diffuse tenderness, however worse on left abdomen. Mild distention. No hernias palpated.  Musculoskeletal: Normal range of motion. He exhibits no edema.  Neurological: He is alert and oriented to person, place, and time. No cranial nerve deficit.  Skin: Skin is warm and dry.  Psychiatric: He has a normal mood and affect.    ED Course  Procedures (including critical care time) Labs Review Labs Reviewed  COMPREHENSIVE METABOLIC PANEL - Abnormal; Notable for the following:    Sodium 133 (*)    Chloride 88 (*)    Glucose, Bld 198 (*)    BUN 35 (*)    Creatinine, Ser 2.80 (*)    Alkaline Phosphatase 128 (*)    GFR calc non Af Amer 22 (*)    GFR calc Af Amer 25 (*)    All other components within normal limits  CBC WITH DIFFERENTIAL - Abnormal; Notable for the following:    WBC 16.4 (*)    RBC 4.21 (*)    Hemoglobin 9.0 (*)    HCT 29.2 (*)    MCV 69.4 (*)    MCH 21.4 (*)    RDW 17.9 (*)    Platelets 535 (*)    All other components within normal limits  LIPASE, BLOOD  CBC WITH DIFFERENTIAL  URINALYSIS, ROUTINE W REFLEX MICROSCOPIC   Imaging Review Dg Abd 2 Views  10/11/2013   CLINICAL DATA:  Capsule endoscopy  EXAM: ABDOMEN - 2 VIEW  COMPARISON:  08/03/2013  FINDINGS: Upright film shows no evidence for intraperitoneal free air.  Supine film shows mild gaseous distention of small bowel without evidence for overt obstruction. Suture material is seen in the abdomen bilaterally.  Capsule with camera is identified in the left abdomen, in a similar location  to the previous study.  IMPRESSION: Persistent retention of the capsule after capsule endoscopy. No overt bowel obstruction at this time and the capsule is in a similar location to the previous study, likely in the distal descending to proximal sigmoid colon.   Electronically Signed   By: Misty Stanley M.D.   On: 10/11/2013 10:50    EKG Interpretation   None       MDM   Final diagnoses:  None    Patient nausea vomiting diarrhea. Had had ulcers in the small bowel from NSAIDs. Had previous colectomy.  Patient with new renal failure. May be due to dehydration. CT scan done without contrast. Will likely require admission for dehydration and renal insufficiency, and depending on what CT shows.   Jasper Riling. Alvino Chapel, MD 10/12/13 [phone]

## 2013-10-12 NOTE — ED Notes (Addendum)
Pt in c/o abd pain over the last few days, states pain got worse last night, states he thinks he has an intestinal blockage, c/o vomiting that started last night, last BM this am that was mostly liquid, pt states pain is intermittent, denies pain at this time.

## 2013-10-12 NOTE — ED Notes (Signed)
Called to give report; nurse tied up

## 2013-10-12 NOTE — ED Provider Notes (Signed)
Signed out with plan to follow up CT and dispo. CT reviewed, multiple concerning findings for malignancy. Spoke with radiology and updated patient/ family.  Spoke with general surgery for admission. NG tube ordered.   Ct Abdomen Pelvis Wo Contrast  10/12/2013   CLINICAL DATA:  Abdominal pain. Patient has not passed camera capsule. [name]  EXAM: CT ABDOMEN AND PELVIS WITHOUT CONTRAST  TECHNIQUE: Multidetector CT imaging of the abdomen and pelvis was performed following the standard protocol without intravenous contrast.  COMPARISON:  DG ABD 2 VIEWS dated 10/11/2013  FINDINGS: Liver normal. Spleen normal. No focal pancreatic abnormality. Punctate calcifications no the pancreas no evidence of pancreatitis. No biliary distention. Gallbladder is nondistended. No pericholecystic fluid collections.  Adrenals normal. No focal renal lesions identified. No hydronephrosis. No obstructing ureteral stone. The bladder is nondistended. Prostate seeds are noted.  Aortic atherosclerotic vascular disease present.  No aneurysm.  Surgical clips are noted throughout the abdomen and pelvis. Appendix not visualized. Surgical sutures are in the rectosigmoid. Patient has had a prior colectomy. There is a 7.2 x 5.9 x 7.5 cm large soft tissue mass involving a small bowel loop in the lower left abdomen with resulting small bowel obstruction, best seen on image number 61/series 2. Differential diagnosis would include primary and metastatic small bowel neoplasm . Endoscopic camera is noted just above the a small bowel obstruction. Proximal small bowel obstruction is again present. The small bowel is distended up to 4.4 cm in diameter. No free air is identified.  Present in the right lower quadrant is a 10.3 x 7.5 x 15.1 cm mixed density mass with involvement of the right ileo psoas muscle. Adjacent fat planes stranding along the undersurface of the third portion of the duodenum is present. Differential diagnosis includes infection, old  hemorrhage, and tumor. Primary and metastatic malignancy could present in this fashion. Retroperitoneal sarcoma and lymphoma could present in this fashion. PET-CT can be obtained for further evaluation.  Heart size is normal. Coronary artery disease. Passive atelectasis lung bases. Degenerative changes lumbar spine. Tiny umbilical hernia and left inguinal hernia with herniation of fat only.  IMPRESSION: 1. 7.2 x 5.9 x 7.5 cm large soft tissue mass involving a small bowel loop in the left abdomen resulting in an apple core type obstruction of of the small bowel. Differential diagnosis would include primary and metastatic static small bowel malignancy. Endoscopic camera is noted just above the small bowel obstruction. Patient has had a prior colectomy.  2. 10.3 x 7.5 x 15.1 cm mixed density mass involving the right ileo psoas muscle. Differential diagnosis includes infection, old hemorrhage, and tumor. Primary and metastatic malignancy could present in this fashion. This would include retroperitoneal sarcoma and lymphoma. PET-CT would be useful for further evaluation of this patient.  3. Prostate seeds are noted. This is consistent with the patient's known history prostate cancer.  These results were called by telephone at the time of interpretation on 10/12/2013 at 5:18 PM to Dr. Reather Converse , who verbally acknowledged these results.   Electronically Signed   By: Marcello Moores  Register   On: 10/12/2013 17:17   Dg Abd 2 Views  10/11/2013   CLINICAL DATA:  Capsule endoscopy  EXAM: ABDOMEN - 2 VIEW  COMPARISON:  08/03/2013  FINDINGS: Upright film shows no evidence for intraperitoneal free air.  Supine film shows mild gaseous distention of small bowel without evidence for overt obstruction. Suture material is seen in the abdomen bilaterally.  Capsule with camera is identified in the left abdomen,  in a similar location to the previous study.  IMPRESSION: Persistent retention of the capsule after capsule endoscopy. No overt bowel  obstruction at this time and the capsule is in a similar location to the previous study, likely in the distal descending to proximal sigmoid colon.   Electronically Signed   By: Misty Stanley M.D.   On: 10/11/2013 10:50   Abdominal mass, Vomiting, SBO, Hx of colonscopy  Mariea Clonts, MD 10/12/13 [phone]

## 2013-10-12 NOTE — ED Notes (Addendum)
First dose of ondansetron was infiltrated in 1st IV line; wasted in computer and gave 2nd ondansetron at 1512; Lab called approximately 1450 [address]) advised that labs were clotted and needed to be redrawn; labs were redrawn when 2nd IV was placed and walked to lab attendant

## 2013-10-12 NOTE — H&P (Addendum)
Dean Owens is an 68 y.o. male.   Chief Complaint: Nausea and vomiting HPI: Patient is well known to our practice with a previous history of segmental colectomy followed later by a completion colectomy for separate colon cancers. Surgeries were done by Dr. Rise Patience.He underwent adjuvant chemotherapy. Additionally, he is undergoing treatment for prostate cancer. He underwent a procedure on February 6 with Dr. Gaynelle Arabian. He presented to the emergency department with three-day history of nausea and vomiting. Workup included CT scan demonstrating 2 separate intra-abdominal masses consistent with tumor. One involves the small bowel causing a small bowel obstruction. Of note he has retained capsule endoscopy which he swallowed last November during work-up for anemia by Dr. Howell Rucks. He is passing some gas and currently feels like he needs to have a bowel movement.  Past Medical History  Diagnosis Date  . Hypertension   . Hyperlipidemia   . Type 2 diabetes mellitus   . Hypothyroidism   . History of colon cancer NO RECURRENCE    S/P COLECTOMY X3  LAST ONE 1998--  [place]  . Recurrent prostate carcinoma FOLLOWED BY DR Gaynelle Arabian    DX 2007  S/P RADIOACTIVE SEED IMPLANTS  . ED (erectile dysfunction) of organic origin   . Right knee DJD   . Eczema   . OA (osteoarthritis)     LEFT KNEE  . Wears glasses   . History of iron deficiency anemia   . History of squamous cell carcinoma excision     Past Surgical History  Procedure Laterality Date  . Givens capsule study N/A 07/05/2013    Procedure: GIVENS CAPSULE STUDY;  Surgeon: [name], MD;  Location: [place];  Service: Endoscopy;  Laterality: N/A;  . Radioactive prostate seed implants  OCT 2007  . Knee arthroscopy Bilateral 2006 &  2007  . Tonsillectomy  AS CHILD  . Appendectomy  AGE 60  . Colon surgery  X3   LAST ONE 1998 TOTAL COLECTOMY  . Cryoablation N/A 10/01/2013    Procedure: CRYO ABLATION PROSTATE;   Surgeon: [name], MD;  Location: [facility];  Service: Urology;  Laterality: N/A;  . Prostate surgery      History reviewed. No pertinent family history. Social History:  reports that he quit smoking about 15 years ago. His smoking use included Cigarettes. He has a 7.5 pack-year smoking history. He has never used smokeless tobacco. He reports that he drinks alcohol. He reports that he does not use illicit drugs.  Allergies: No Known Allergies   (Not in a hospital admission)  Results for orders placed during the hospital encounter of 10/12/13 (from the past 48 hour(s))  COMPREHENSIVE METABOLIC PANEL     Status: Abnormal   Collection Time    10/12/13  2:25 PM      Result Value Ref Range   Sodium 133 (*) 137 - 147 mEq/L   Potassium 4.7  3.7 - 5.3 mEq/L   Chloride 88 (*) 96 - 112 mEq/L   CO2 26  19 - 32 mEq/L   Glucose, Bld 198 (*) 70 - 99 mg/dL   BUN 35 (*) 6 - 23 mg/dL   Creatinine, Ser 2.80 (*) 0.50 - 1.35 mg/dL   Calcium 9.8  8.4 - 10.5 mg/dL   Total Protein 7.6  6.0 - 8.3 g/dL   Albumin 3.5  3.5 - 5.2 g/dL   AST 28  0 - 37 U/L   ALT 18  0 - 53 U/L  Alkaline Phosphatase 128 (*) 39 - 117 U/L   Total Bilirubin 0.3  0.3 - 1.2 mg/dL   GFR calc non Af Amer 22 (*) >90 mL/min   GFR calc Af Amer 25 (*) >90 mL/min   Comment: (NOTE)     The eGFR has been calculated using the CKD EPI equation.     This calculation has not been validated in all clinical situations.     eGFR's persistently <90 mL/min signify possible Chronic Kidney     Disease.  LIPASE, BLOOD     Status: None   Collection Time    10/12/13  2:25 PM      Result Value Ref Range   Lipase 42  11 - 59 U/L  CBC WITH DIFFERENTIAL     Status: Abnormal   Collection Time    10/12/13  2:57 PM      Result Value Ref Range   WBC 16.4 (*) 4.0 - 10.5 K/uL   RBC 4.21 (*) 4.22 - 5.81 MIL/uL   Hemoglobin 9.0 (*) 13.0 - 17.0 g/dL   HCT 29.2 (*) 39.0 - 52.0 %   MCV 69.4 (*) 78.0 - 100.0 fL   MCH 21.4  (*) 26.0 - 34.0 pg   MCHC 30.8  30.0 - 36.0 g/dL   RDW 17.9 (*) 11.5 - 15.5 %   Platelets 535 (*) 150 - 400 K/uL   Neutrophils Relative % 80 (*) 43 - 77 %   Lymphocytes Relative 10 (*) 12 - 46 %   Monocytes Relative 9  3 - 12 %   Eosinophils Relative 1  0 - 5 %   Basophils Relative 0  0 - 1 %   Neutro Abs 13.1 (*) 1.7 - 7.7 K/uL   Lymphs Abs 1.6  0.7 - 4.0 K/uL   Monocytes Absolute 1.5 (*) 0.1 - 1.0 K/uL   Eosinophils Absolute 0.2  0.0 - 0.7 K/uL   Basophils Absolute 0.0  0.0 - 0.1 K/uL   RBC Morphology POLYCHROMASIA PRESENT     Comment: TARGET CELLS     SPHEROCYTES  PROTIME-INR     Status: None   Collection Time    10/12/13  2:57 PM      Result Value Ref Range   Prothrombin Time 14.4  11.6 - 15.2 seconds   INR 1.14  0.00 - 1.49  URINALYSIS, ROUTINE W REFLEX MICROSCOPIC     Status: Abnormal   Collection Time    10/12/13  5:29 PM      Result Value Ref Range   Color, Urine YELLOW  YELLOW   APPearance CLEAR  CLEAR   Specific Gravity, Urine 1.018  1.005 - 1.030   pH 7.0  5.0 - 8.0   Glucose, UA NEGATIVE  NEGATIVE mg/dL   Hgb urine dipstick NEGATIVE  NEGATIVE   Bilirubin Urine NEGATIVE  NEGATIVE   Ketones, ur NEGATIVE  NEGATIVE mg/dL   Protein, ur 30 (*) NEGATIVE mg/dL   Urobilinogen, UA 0.2  0.0 - 1.0 mg/dL   Nitrite NEGATIVE  NEGATIVE   Leukocytes, UA NEGATIVE  NEGATIVE  URINE MICROSCOPIC-ADD ON     Status: Abnormal   Collection Time    10/12/13  5:29 PM      Result Value Ref Range   Squamous Epithelial / LPF FEW (*) RARE   WBC, UA 0-2  <3 WBC/hpf   RBC / HPF 0-2  <3 RBC/hpf   Bacteria, UA RARE  RARE   Casts HYALINE CASTS (*) NEGATIVE  Ct Abdomen Pelvis Wo Contrast  10/12/2013   CLINICAL DATA:  Abdominal pain. Patient has not passed camera capsule. [name]  EXAM: CT ABDOMEN AND PELVIS WITHOUT CONTRAST  TECHNIQUE: Multidetector CT imaging of the abdomen and pelvis was performed following the standard protocol without intravenous contrast.  COMPARISON:  DG ABD 2 VIEWS dated  10/11/2013  FINDINGS: Liver normal. Spleen normal. No focal pancreatic abnormality. Punctate calcifications no the pancreas no evidence of pancreatitis. No biliary distention. Gallbladder is nondistended. No pericholecystic fluid collections.  Adrenals normal. No focal renal lesions identified. No hydronephrosis. No obstructing ureteral stone. The bladder is nondistended. Prostate seeds are noted.  Aortic atherosclerotic vascular disease present.  No aneurysm.  Surgical clips are noted throughout the abdomen and pelvis. Appendix not visualized. Surgical sutures are in the rectosigmoid. Patient has had a prior colectomy. There is a 7.2 x 5.9 x 7.5 cm large soft tissue mass involving a small bowel loop in the lower left abdomen with resulting small bowel obstruction, best seen on image number 61/series 2. Differential diagnosis would include primary and metastatic small bowel neoplasm . Endoscopic camera is noted just above the a small bowel obstruction. Proximal small bowel obstruction is again present. The small bowel is distended up to 4.4 cm in diameter. No free air is identified.  Present in the right lower quadrant is a 10.3 x 7.5 x 15.1 cm mixed density mass with involvement of the right ileo psoas muscle. Adjacent fat planes stranding along the undersurface of the third portion of the duodenum is present. Differential diagnosis includes infection, old hemorrhage, and tumor. Primary and metastatic malignancy could present in this fashion. Retroperitoneal sarcoma and lymphoma could present in this fashion. PET-CT can be obtained for further evaluation.  Heart size is normal. Coronary artery disease. Passive atelectasis lung bases. Degenerative changes lumbar spine. Tiny umbilical hernia and left inguinal hernia with herniation of fat only.  IMPRESSION: 1. 7.2 x 5.9 x 7.5 cm large soft tissue mass involving a small bowel loop in the left abdomen resulting in an apple core type obstruction of of the small bowel.  Differential diagnosis would include primary and metastatic static small bowel malignancy. Endoscopic camera is noted just above the small bowel obstruction. Patient has had a prior colectomy.  2. 10.3 x 7.5 x 15.1 cm mixed density mass involving the right ileo psoas muscle. Differential diagnosis includes infection, old hemorrhage, and tumor. Primary and metastatic malignancy could present in this fashion. This would include retroperitoneal sarcoma and lymphoma. PET-CT would be useful for further evaluation of this patient.  3. Prostate seeds are noted. This is consistent with the patient's known history prostate cancer.  These results were called by telephone at the time of interpretation on 10/12/2013 at 5:18 PM to Dr. Reather Converse , who verbally acknowledged these results.   Electronically Signed   By: Marcello Moores  Register   On: 10/12/2013 17:17   Dg Abd 2 Views  10/11/2013   CLINICAL DATA:  Capsule endoscopy  EXAM: ABDOMEN - 2 VIEW  COMPARISON:  08/03/2013  FINDINGS: Upright film shows no evidence for intraperitoneal free air.  Supine film shows mild gaseous distention of small bowel without evidence for overt obstruction. Suture material is seen in the abdomen bilaterally.  Capsule with camera is identified in the left abdomen, in a similar location to the previous study.  IMPRESSION: Persistent retention of the capsule after capsule endoscopy. No overt bowel obstruction at this time and the capsule is in a similar location to the  previous study, likely in the distal descending to proximal sigmoid colon.   Electronically Signed   By: Misty Stanley M.D.   On: 10/11/2013 10:50    Review of Systems  Constitutional: Negative for fever and chills.  HENT: Negative.   Eyes: Negative.   Respiratory: Negative.   Cardiovascular: Negative.   Gastrointestinal: Positive for nausea and vomiting. Negative for abdominal pain.       See history of present illness  Genitourinary:       See history of present illness   Musculoskeletal:       Scheduled for knee replacement surgery next month  Skin: Negative for rash.  Neurological: Negative.   Endo/Heme/Allergies: Negative.     Blood pressure 109/56, pulse 86, temperature 98 F (36.7 C), resp. rate 18, weight 210 lb (95.255 kg), SpO2 99.00%. Physical Exam  Constitutional: He is oriented to person, place, and time. He appears well-developed and well-nourished. No distress.  HENT:  Head: Normocephalic and atraumatic.  Mouth/Throat: Oropharynx is clear and moist. No oropharyngeal exudate.  Eyes: EOM are normal. Pupils are equal, round, and reactive to light. Right eye exhibits no discharge. Left eye exhibits no discharge. No scleral icterus.  Neck: Normal range of motion. Neck supple. No tracheal deviation present.  Cardiovascular: Normal rate, regular rhythm, normal heart sounds and intact distal pulses.   No murmur heard. Respiratory: Effort normal and breath sounds normal. No stridor. No respiratory distress. He has no wheezes. He has no rales.  No palpable cervical, supraclavicular, or bilateral axillary lymphadenopathy  GI: Soft. Bowel sounds are normal. He exhibits distension and mass. There is no tenderness. There is no rebound and no guarding.  vague palpable right lower quadrant and left lower quadrant abdominal mass, no significant tenderness, bowel sounds are present   Musculoskeletal: Normal range of motion. He exhibits no edema.  Lymphadenopathy:    He has no cervical adenopathy.  Neurological: He is alert and oriented to person, place, and time. He exhibits normal muscle tone.  Skin: Skin is warm and dry.  Psychiatric: He has a normal mood and affect.     Assessment/Plan 2 large intra-abdominal tumors with history of colon cancer prostate cancer. One of these tumors is causing small bowel obstruction. Will place nasogastric tube and admit to the surgery service. We will contact Dr. Wynetta Emery and let them know regarding the retained capsule  endoscopy. Plan will be to consider attempted resection of the small bowel mass with hopes to restore bowel continuity. I had a long talk with him and his wife. I discussed that this is likely stage IV cancer and we will do what we can to help him. I answered their questions.Elevated creatinine appears chronic. We will hydrate.   Sydnei Ohaver E 10/12/2013, 6:26 PM

## 2013-10-13 LAB — GLUCOSE, CAPILLARY
GLUCOSE-CAPILLARY: 145 mg/dL — AB (ref 70–99)
Glucose-Capillary: 137 mg/dL — ABNORMAL HIGH (ref 70–99)
Glucose-Capillary: 144 mg/dL — ABNORMAL HIGH (ref 70–99)
Glucose-Capillary: 145 mg/dL — ABNORMAL HIGH (ref 70–99)
Glucose-Capillary: 153 mg/dL — ABNORMAL HIGH (ref 70–99)
Glucose-Capillary: 177 mg/dL — ABNORMAL HIGH (ref 70–99)
Glucose-Capillary: 185 mg/dL — ABNORMAL HIGH (ref 70–99)

## 2013-10-13 LAB — CREATININE, SERUM
CREATININE: 2.14 mg/dL — AB (ref 0.50–1.35)
GFR calc Af Amer: 35 mL/min — ABNORMAL LOW (ref >90)
GFR, EST NON AFRICAN AMERICAN: 30 mL/min — AB (ref >90)

## 2013-10-13 LAB — BASIC METABOLIC PANEL
BUN: 31 mg/dL — AB (ref 6–23)
CALCIUM: 9.1 mg/dL (ref 8.4–10.5)
CO2: 29 meq/L (ref 19–32)
CREATININE: 2.49 mg/dL — AB (ref 0.50–1.35)
Chloride: 92 mEq/L — ABNORMAL LOW (ref 96–112)
GFR calc Af Amer: 29 mL/min — ABNORMAL LOW (ref >90)
GFR calc non Af Amer: 25 mL/min — ABNORMAL LOW (ref >90)
Glucose, Bld: 135 mg/dL — ABNORMAL HIGH (ref 70–99)
Potassium: 3.5 mEq/L — ABNORMAL LOW (ref 3.7–5.3)
Sodium: 137 mEq/L (ref 137–147)

## 2013-10-13 LAB — CBC
HCT: 27.7 % — ABNORMAL LOW (ref 39.0–52.0)
Hemoglobin: 8.6 g/dL — ABNORMAL LOW (ref 13.0–17.0)
MCH: 21.7 pg — AB (ref 26.0–34.0)
MCHC: 31 g/dL (ref 30.0–36.0)
MCV: 69.8 fL — AB (ref 78.0–100.0)
PLATELETS: 464 10*3/uL — AB (ref 150–400)
RBC: 3.97 MIL/uL — ABNORMAL LOW (ref 4.22–5.81)
RDW: 17.9 % — AB (ref 11.5–15.5)
WBC: 12.7 10*3/uL — AB (ref 4.0–10.5)

## 2013-10-13 LAB — ABO/RH: ABO/RH(D): O POS

## 2013-10-13 LAB — CEA: CEA: 5.8 ng/mL — ABNORMAL HIGH (ref 0.0–5.0)

## 2013-10-13 MED ORDER — SODIUM CHLORIDE 0.9 % IJ SOLN
10.0000 mL | INTRAMUSCULAR | Status: DC | PRN
  Administered 2013-10-16 – 2013-10-21 (×5): 10 mL

## 2013-10-13 MED ORDER — DEXTROSE 5 % IV SOLN
2.0000 g | INTRAVENOUS | Status: AC
  Administered 2013-10-14: 2 g via INTRAVENOUS
  Filled 2013-10-13 (×2): qty 2

## 2013-10-13 MED ORDER — FAT EMULSION 20 % IV EMUL
250.0000 mL | INTRAVENOUS | Status: AC
  Administered 2013-10-13: 250 mL via INTRAVENOUS
  Filled 2013-10-13: qty 250

## 2013-10-13 MED ORDER — TRACE MINERALS CR-CU-F-FE-I-MN-MO-SE-ZN IV SOLN
INTRAVENOUS | Status: AC
  Administered 2013-10-13: 18:00:00 via INTRAVENOUS
  Filled 2013-10-13: qty 1000

## 2013-10-13 NOTE — Progress Notes (Signed)
INITIAL NUTRITION ASSESSMENT  DOCUMENTATION CODES Per approved criteria  -Severe malnutrition in the context of chronic illness   INTERVENTION: TPN per Pharmacy RD to continue to monitor nutrition care plan Monitor magnesium, potassium, and phosphorus daily for at least 3 days, MD to replete as needed, as pt is at risk for refeeding syndrome given minimal to no PO intake for past 7 days.   NUTRITION DIAGNOSIS: Inadequate oral intake related to SBO as evidenced by 9% weight loss in one month per pt report.   Goal: Pt to meet >/= 90% of their estimated nutrition needs   Monitor:  TPN initiation, goals of care, weight trends, labs  Reason for Assessment: Consult for new TPN/TNA  68 y.o. male  Admitting Dx: <principal problem not specified>  ASSESSMENT: 69 year old male with previous history of segmental colectomy followed later by a completion colectomy for separate colon cancers. He underwent adjuvant chemotherapy. Additionally, he is undergoing treatment for prostate cancer. He underwent a procedure on February 6 with Dr. Gaynelle Arabian. He presented to the emergency department with three-day history of nausea and vomiting. Workup included CT scan demonstrating 2 separate intra-abdominal masses consistent with tumor. One involves the small bowel causing a small bowel obstruction.  He is passing some gas and currently feels like he needs to have a bowel movement.  Pt resting at time of visit; answered questions with brief responses and kept eyes closed during visit. Pt reports that he was at his usual body weight of 230 lbs about 4 weeks ago. He reports 20 lbs weight loss in the past 3-4 weeks due to SBO and inability to tolerate food. Per MD note, pt has had poor intake over the last several weeks and expected NPO status for at least a week. Will attempt nutrition-focused physical exam at later date.  Per MD note, continue NGT and NPO status. Will need operation this admission for  palliative cause to relieve obstruction. If these are metastatic lesions, our surgery is not curable.   Labs: low potassium, low GFR, elevated BUN and creatinine, low hemoglobin  Pt meets criteria for SEVERE MALNUTRITION in the context of Chronic Illness as evidenced by 9% weight loss in less than one month and estimated energy intake <75% of estimated energy needs for >/= one month.   Height: Ht Readings from Last 1 Encounters:  10/12/13 6' (1.829 m)    Weight: Wt Readings from Last 1 Encounters:  10/12/13 210 lb (95.255 kg)    Ideal Body Weight: 178 lbs  % Ideal Body Weight: 118%  Wt Readings from Last 10 Encounters:  10/12/13 210 lb (95.255 kg)  10/01/13 215 lb (97.523 kg)  10/01/13 215 lb (97.523 kg)  07/05/13 220 lb (99.791 kg)  07/05/13 220 lb (99.791 kg)    Usual Body Weight: 230 lbs  % Usual Body Weight: 91%  BMI:  Body mass index is 28.47 kg/(m^2).  Estimated Nutritional Needs: Kcal: 2380-2560 Protein: 125-140 grams Fluid: 2.4-2.6 L/day  Skin: perineum incision  Diet Order: NPO  EDUCATION NEEDS: -No education needs identified at this time   Intake/Output Summary (Last 24 hours) at 10/13/13 1520 Last data filed at 10/13/13 0738  Gross per 24 hour  Intake 1064.58 ml  Output   1310 ml  Net -245.42 ml    Last BM: 2/17  Labs:   Recent Labs Lab 10/12/13 1425 10/13/13 0450 10/13/13 1400  NA 133* 137  --   K 4.7 3.5*  --   CL 88* 92*  --  CO2 26 29  --   BUN 35* 31*  --   CREATININE 2.80* 2.49* 2.14*  CALCIUM 9.8 9.1  --   GLUCOSE 198* 135*  --     CBG (last 3)   Recent Labs  10/13/13 0431 10/13/13 0810 10/13/13 1204  GLUCAP 137* 153* 145*    Scheduled Meds: . antiseptic oral rinse  15 mL Mouth Rinse q12n4p  . [START ON 10/14/2013] cefOXitin  2 g Intravenous On Call to OR  . chlorhexidine  15 mL Mouth Rinse BID  . insulin aspart  0-9 Units Subcutaneous 6 times per day  . levothyroxine  25 mcg Intravenous Daily  .  pantoprazole (PROTONIX) IV  40 mg Intravenous QHS    Continuous Infusions: . dextrose 5 % and 0.45% NaCl 125 mL/hr at 10/13/13 0448  . Marland KitchenTPN (CLINIMIX-E) Adult     And  . fat emulsion      Past Medical History  Diagnosis Date  . Hypertension   . Hyperlipidemia   . Type 2 diabetes mellitus   . Hypothyroidism   . History of colon cancer NO RECURRENCE    S/P COLECTOMY X3  LAST ONE 1998--  [place]  . Recurrent prostate carcinoma FOLLOWED BY DR Gaynelle Arabian    DX 2007  S/P RADIOACTIVE SEED IMPLANTS  . ED (erectile dysfunction) of organic origin   . Right knee DJD   . Eczema   . OA (osteoarthritis)     LEFT KNEE  . Wears glasses   . History of iron deficiency anemia   . History of squamous cell carcinoma excision     Past Surgical History  Procedure Laterality Date  . Givens capsule study N/A 07/05/2013    Procedure: GIVENS CAPSULE STUDY;  Surgeon: [name], MD;  Location: [facility];  Service: Endoscopy;  Laterality: N/A;  . Radioactive prostate seed implants  OCT 2007  . Knee arthroscopy Bilateral 2006 &  2007  . Tonsillectomy  AS CHILD  . Appendectomy  AGE 36  . Colon surgery  X3   LAST ONE 1998 TOTAL COLECTOMY  . Cryoablation N/A 10/01/2013    Procedure: CRYO ABLATION PROSTATE;  Surgeon: [name], MD;  Location: [facility];  Service: Urology;  Laterality: N/A;  . Prostate surgery      Pryor Ochoa RD, LDN Inpatient Clinical Dietitian Pager: [phone] After Hours Pager: [phone]

## 2013-10-13 NOTE — Progress Notes (Signed)
Pt seen and examined.   Alert, nad cta b/l  Reg Soft, nt, nd. Well healed midline incsion no hernia No edema  Films reviewed.   Primary problem right now his large intra-abdominal mass causing a partial small bowel obstruction in left lower quadrant as well as retained capsule endoscopy in this vicinity.  Limited options to deal with this.  Had a 30 minute conversation with the patient and his wife discussing surgical options. I do not believe neoadjuvant therapy would be of any benefit. Therefore recommending exploratory laparotomy with possible en bloc resection of small bowel tumor versus small bowel bypass plus or minus ostomy and feeding tube. Explained that a lot of what we do will depend on intraoperative findings. Explained that I see no role for addressing the right retroperitoneal mass. We may consider biopsy of the mass. An extensive discussion regarding risks and benefits of surgery was performed. We discussed bleeding, infection, injury to surrounding structures, anastomotic leak, anastomotic stricture, fistula formation, incisional hernia, blood clot formation, perioperative cardiac or pulmonary complications, prolonged ileus, possible feeding tube complications. We discussed that the procedure would not be for cure but for palliation. He also discussed that he could have progressive disease despite surgical intervention. All of their questions were asked and answered. We'll plan for exploratory surgery in the morning assuming his creatinine continues to normalize.  Leighton Ruff. Redmond Pulling, MD, FACS General, Bariatric, & Minimally Invasive Surgery [facility], [place]

## 2013-10-13 NOTE — Progress Notes (Addendum)
PARENTERAL NUTRITION CONSULT NOTE - INITIAL  Pharmacy Consult for TPN  Indication: bowel obstruction  No Known Allergies  Patient Measurements: Height: 6' (182.9 cm) Weight: 210 lb (95.255 kg) IBW/kg (Calculated) : 77.6   Vital Signs: Temp: 98 F (36.7 C) (02/18 0432) Temp src: Oral (02/18 0432) BP: 126/44 mmHg (02/18 0432) Pulse Rate: 72 (02/18 0432) Intake/Output from previous day: 02/17 0701 - 02/18 0700 In: 1064.6 [I.V.:1064.6] Out: 1110 [Urine:275; Emesis/NG output:835] Intake/Output from this shift: Total I/O In: -  Out: 200 [Emesis/NG output:200]  Labs:  Recent Labs  10/12/13 1457 10/13/13 0450  WBC 16.4* 12.7*  HGB 9.0* 8.6*  HCT 29.2* 27.7*  PLT 535* 464*  INR 1.14  --      Recent Labs  10/12/13 1425 10/13/13 0450  NA 133* 137  K 4.7 3.5*  CL 88* 92*  CO2 26 29  GLUCOSE 198* 135*  BUN 35* 31*  CREATININE 2.80* 2.49*  CALCIUM 9.8 9.1  PROT 7.6  --   ALBUMIN 3.5  --   AST 28  --   ALT 18  --   ALKPHOS 128*  --   BILITOT 0.3  --    Estimated Creatinine Clearance: 34.5 ml/min (by C-G formula based on Cr of 2.49).    Recent Labs  10/13/13 10/13/13 0431 10/13/13 0810  GLUCAP 145* 137* 153*    Medical History: Past Medical History  Diagnosis Date  . Hypertension   . Hyperlipidemia   . Type 2 diabetes mellitus   . Hypothyroidism   . History of colon cancer NO RECURRENCE    S/P COLECTOMY X3  LAST ONE 1998--  [place]  . Recurrent prostate carcinoma FOLLOWED BY DR Gaynelle Arabian    DX 2007  S/P RADIOACTIVE SEED IMPLANTS  . ED (erectile dysfunction) of organic origin   . Right knee DJD   . Eczema   . OA (osteoarthritis)     LEFT KNEE  . Wears glasses   . History of iron deficiency anemia   . History of squamous cell carcinoma excision     GI: Admitted with 3 day h/o N&V.  He had recent procedure 2/6 for prostate cancer.  He also has a h/o colon cancer.  CT reveals 2 intra-adbominal masses likely tumor one of which is  causing bowel obstruction. Also has retained endoscopic capsule.  NT tube in place 200 ml output today Endo: h/o DM  cbgs 107-153   4 units insulin in last 24 hours Lytes: Na 137, K 3.5 Renal: SrCr 2.49  CrCl ~35 ml/min Pulm: RA Cards: BP soft Hepatobil: alk phos 128. Alb 3.5  Other labs wnl ID:   Afeb, WBC 12.7  No Abx.   Best Practices: IV ppi, scds TPN Access: plan PICC line placement 2/18 TPN day#:0  Current Nutrition:  NPO except ice chips  Nutritional Goals:  Will f/u RD recommendations  Plan:  Start Clinimix E 5/15 at 40 ml/hr and lipids 20% at 5 ml/hr F/u TPN labs in am F/u RD rec F/u insulin requirements  Thanks for allowing pharmacy to be a part of this patient's care.  Excell Seltzer, PharmD Clinical Pharmacist, [phone] 10/13/2013,9:38 AM

## 2013-10-13 NOTE — Progress Notes (Signed)
Patient ID: Dean Owens, male   DOB: 04/25/1946, 68 y.o.   MRN: [id number]    Subjective: Patient states he feels better today, but c/o pain in sinus cavity due to NGT.  Patient states he has had 3 colon cancers and only has a rectum left.  He has an ileorectal anastomosis.  He was just diagnosed with recurrent prostate cancer and underwent cryoablation by Dr. Gaynelle Arabian 2 Fridays ago.  He does not have an oncologist as he has not needed one for a while and the previous one he had, retired.  His PCP is Dr. Nehemiah Settle.  I will send him a message today to let him know what's going on.  Objective: Vital signs in last 24 hours: Temp:  [97.8 F (36.6 C)-98 F (36.7 C)] 98 F (36.7 C) (02/18 0432) Pulse Rate:  [70-89] 72 (02/18 0432) Resp:  [18-20] 18 (02/17 1643) BP: (91-151)/(44-73) 126/44 mmHg (02/18 0432) SpO2:  [98 %-100 %] 98 % (02/18 0432) Weight:  [210 lb (95.255 kg)] 210 lb (95.255 kg) (02/17 2100) Last BM Date: 10/12/13  Intake/Output from previous day: 02/17 0701 - 02/18 0700 In: 1064.6 [I.V.:1064.6] Out: 1110 [Urine:275; Emesis/NG output:835] Intake/Output this shift: Total I/O In: -  Out: 200 [Emesis/NG output:200]  PE: Abd: soft, NT, ND, +BS, NGT with initially 900cc of output Heart: regular Lungs: CTAB  Lab Results:   Recent Labs  10/12/13 1457 10/13/13 0450  WBC 16.4* 12.7*  HGB 9.0* 8.6*  HCT 29.2* 27.7*  PLT 535* 464*   BMET  Recent Labs  10/12/13 1425 10/13/13 0450  NA 133* 137  K 4.7 3.5*  CL 88* 92*  CO2 26 29  GLUCOSE 198* 135*  BUN 35* 31*  CREATININE 2.80* 2.49*  CALCIUM 9.8 9.1   PT/INR  Recent Labs  10/12/13 1457  LABPROT 14.4  INR 1.14   CMP     Component Value Date/Time   NA 137 10/13/2013 0450   K 3.5* 10/13/2013 0450   CL 92* 10/13/2013 0450   CO2 29 10/13/2013 0450   GLUCOSE 135* 10/13/2013 0450   BUN 31* 10/13/2013 0450   CREATININE 2.49* 10/13/2013 0450   CALCIUM 9.1 10/13/2013 0450   PROT 7.6 10/12/2013 1425    ALBUMIN 3.5 10/12/2013 1425   AST 28 10/12/2013 1425   ALT 18 10/12/2013 1425   ALKPHOS 128* 10/12/2013 1425   BILITOT 0.3 10/12/2013 1425   GFRNONAA 25* 10/13/2013 0450   GFRAA 29* 10/13/2013 0450   Lipase     Component Value Date/Time   LIPASE 42 10/12/2013 1425       Studies/Results: Ct Abdomen Pelvis Wo Contrast  10/12/2013   CLINICAL DATA:  Abdominal pain. Patient has not passed camera capsule. [name]  EXAM: CT ABDOMEN AND PELVIS WITHOUT CONTRAST  TECHNIQUE: Multidetector CT imaging of the abdomen and pelvis was performed following the standard protocol without intravenous contrast.  COMPARISON:  DG ABD 2 VIEWS dated 10/11/2013  FINDINGS: Liver normal. Spleen normal. No focal pancreatic abnormality. Punctate calcifications no the pancreas no evidence of pancreatitis. No biliary distention. Gallbladder is nondistended. No pericholecystic fluid collections.  Adrenals normal. No focal renal lesions identified. No hydronephrosis. No obstructing ureteral stone. The bladder is nondistended. Prostate seeds are noted.  Aortic atherosclerotic vascular disease present.  No aneurysm.  Surgical clips are noted throughout the abdomen and pelvis. Appendix not visualized. Surgical sutures are in the rectosigmoid. Patient has had a prior colectomy. There is a 7.2 x 5.9 x 7.5 cm  large soft tissue mass involving a small bowel loop in the lower left abdomen with resulting small bowel obstruction, best seen on image number 61/series 2. Differential diagnosis would include primary and metastatic small bowel neoplasm . Endoscopic camera is noted just above the a small bowel obstruction. Proximal small bowel obstruction is again present. The small bowel is distended up to 4.4 cm in diameter. No free air is identified.  Present in the right lower quadrant is a 10.3 x 7.5 x 15.1 cm mixed density mass with involvement of the right ileo psoas muscle. Adjacent fat planes stranding along the undersurface of the third portion of the  duodenum is present. Differential diagnosis includes infection, old hemorrhage, and tumor. Primary and metastatic malignancy could present in this fashion. Retroperitoneal sarcoma and lymphoma could present in this fashion. PET-CT can be obtained for further evaluation.  Heart size is normal. Coronary artery disease. Passive atelectasis lung bases. Degenerative changes lumbar spine. Tiny umbilical hernia and left inguinal hernia with herniation of fat only.  IMPRESSION: 1. 7.2 x 5.9 x 7.5 cm large soft tissue mass involving a small bowel loop in the left abdomen resulting in an apple core type obstruction of of the small bowel. Differential diagnosis would include primary and metastatic static small bowel malignancy. Endoscopic camera is noted just above the small bowel obstruction. Patient has had a prior colectomy.  2. 10.3 x 7.5 x 15.1 cm mixed density mass involving the right ileo psoas muscle. Differential diagnosis includes infection, old hemorrhage, and tumor. Primary and metastatic malignancy could present in this fashion. This would include retroperitoneal sarcoma and lymphoma. PET-CT would be useful for further evaluation of this patient.  3. Prostate seeds are noted. This is consistent with the patient's known history prostate cancer.  These results were called by telephone at the time of interpretation on 10/12/2013 at 5:18 PM to Dr. Reather Converse , who verbally acknowledged these results.   Electronically Signed   By: Marcello Moores  Register   On: 10/12/2013 17:17   Dg Abd 2 Views  10/11/2013   CLINICAL DATA:  Capsule endoscopy  EXAM: ABDOMEN - 2 VIEW  COMPARISON:  08/03/2013  FINDINGS: Upright film shows no evidence for intraperitoneal free air.  Supine film shows mild gaseous distention of small bowel without evidence for overt obstruction. Suture material is seen in the abdomen bilaterally.  Capsule with camera is identified in the left abdomen, in a similar location to the previous study.  IMPRESSION:  Persistent retention of the capsule after capsule endoscopy. No overt bowel obstruction at this time and the capsule is in a similar location to the previous study, likely in the distal descending to proximal sigmoid colon.   Electronically Signed   By: Misty Stanley M.D.   On: 10/11/2013 10:50    Anti-infectives: Anti-infectives   None       Assessment/Plan  1. Intra-abdominal masses, likely metastatic 2. SBO, likely malignant secondary to #1 3. ARI, likely due to dehydration 4. H/o colon cancer x3 5. Recurrent prostate cancer 6. HTN 7. DM  Plan: 1. Will go ahead and start PICC/TNA due to decrease in appetite and poor intake over last several weeks and expected NPO status for at least a week. 2. Cont NGT and NPO status 3. CEA pending 4. Will need operation this admission for palliative cause to relieve obstruction.  If these are metastatic lesions, our surgery is not curable.  I have thoroughly discussed this with the patient and his wife.  They both  seem to understand.  I have also mentioned the possibility of a G-tube, but told them that would be up to the MD, possibly at the time of surgery. 5. I will contact Dr. Maxwell Caul to let him know of the patient's admission. 6. Continue IVFs for elevated creatinine 7. Patient will ultimately need a new oncologist if path shows malignancy   LOS: 1 day    Jaleia Hanke E 10/13/2013, 8:08 AM Pager: [phone]

## 2013-10-13 NOTE — Progress Notes (Signed)
Ok to proceed with PICC line with current renal function per Dr. Redmond Pulling.

## 2013-10-13 NOTE — Progress Notes (Signed)
Peripherally Inserted Central Catheter/Midline Placement  The IV Nurse has discussed with the patient and/or persons authorized to consent for the patient, the purpose of this procedure and the potential benefits and risks involved with this procedure.  The benefits include less needle sticks, lab draws from the catheter and patient may be discharged home with the catheter.  Risks include, but not limited to, infection, bleeding, blood clot (thrombus formation), and puncture of an artery; nerve damage and irregular heat beat.  Alternatives to this procedure were also discussed.  PICC/Midline Placement Documentation        Darlyn Read 10/13/2013, 10:43 AM

## 2013-10-13 NOTE — Progress Notes (Signed)
UR completed.  Dustyn Dansereau, RN BSN M[facility] CCM Trauma/Neuro ICU Case Manager [phone]  

## 2013-10-14 ENCOUNTER — Inpatient Hospital Stay (HOSPITAL_COMMUNITY): Payer: Medicare Other | Admitting: Anesthesiology

## 2013-10-14 ENCOUNTER — Encounter (HOSPITAL_COMMUNITY): Admission: EM | Disposition: A | Discharge status: Home Health | Payer: Self-pay | Source: Home / Self Care

## 2013-10-14 ENCOUNTER — Encounter (HOSPITAL_COMMUNITY): Payer: Self-pay | Admitting: General Surgery

## 2013-10-14 ENCOUNTER — Encounter (HOSPITAL_COMMUNITY): Payer: Medicare Other | Admitting: Anesthesiology

## 2013-10-14 DIAGNOSIS — E43 Unspecified severe protein-calorie malnutrition: Secondary | ICD-10-CM | POA: Insufficient documentation

## 2013-10-14 DIAGNOSIS — D490 Neoplasm of unspecified behavior of digestive system: Secondary | ICD-10-CM | POA: Diagnosis present

## 2013-10-14 DIAGNOSIS — K660 Peritoneal adhesions (postprocedural) (postinfection): Secondary | ICD-10-CM

## 2013-10-14 DIAGNOSIS — C189 Malignant neoplasm of colon, unspecified: Secondary | ICD-10-CM

## 2013-10-14 HISTORY — PX: LAPAROTOMY: SHX154

## 2013-10-14 HISTORY — PX: BOWEL RESECTION: SHX1257

## 2013-10-14 HISTORY — PX: LYSIS OF ADHESION: SHX5961

## 2013-10-14 LAB — DIFFERENTIAL
BASOS ABS: 0 10*3/uL (ref 0.0–0.1)
Basophils Relative: 0 % (ref 0–1)
Eosinophils Absolute: 0.2 10*3/uL (ref 0.0–0.7)
Eosinophils Relative: 2 % (ref 0–5)
LYMPHS PCT: 10 % — AB (ref 12–46)
Lymphs Abs: 1.2 10*3/uL (ref 0.7–4.0)
MONOS PCT: 11 % (ref 3–12)
Monocytes Absolute: 1.3 10*3/uL — ABNORMAL HIGH (ref 0.1–1.0)
NEUTROS ABS: 9.4 10*3/uL — AB (ref 1.7–7.7)
Neutrophils Relative %: 77 % (ref 43–77)

## 2013-10-14 LAB — GLUCOSE, CAPILLARY
GLUCOSE-CAPILLARY: 188 mg/dL — AB (ref 70–99)
GLUCOSE-CAPILLARY: 161 mg/dL — AB (ref 70–99)
Glucose-Capillary: 209 mg/dL — ABNORMAL HIGH (ref 70–99)
Glucose-Capillary: 271 mg/dL — ABNORMAL HIGH (ref 70–99)
Glucose-Capillary: 235 mg/dL — ABNORMAL HIGH (ref 70–99)

## 2013-10-14 LAB — COMPREHENSIVE METABOLIC PANEL
ALK PHOS: 88 U/L (ref 39–117)
ALT: 12 U/L (ref 0–53)
AST: 16 U/L (ref 0–37)
Albumin: 2.6 g/dL — ABNORMAL LOW (ref 3.5–5.2)
BUN: 20 mg/dL (ref 6–23)
CO2: 27 meq/L (ref 19–32)
CREATININE: 1.64 mg/dL — AB (ref 0.50–1.35)
Calcium: 8.9 mg/dL (ref 8.4–10.5)
Chloride: 95 mEq/L — ABNORMAL LOW (ref 96–112)
GFR calc Af Amer: 48 mL/min — ABNORMAL LOW (ref >90)
GFR, EST NON AFRICAN AMERICAN: 42 mL/min — AB (ref >90)
Glucose, Bld: 187 mg/dL — ABNORMAL HIGH (ref 70–99)
POTASSIUM: 3.6 meq/L — AB (ref 3.7–5.3)
Sodium: 135 mEq/L — ABNORMAL LOW (ref 137–147)
Total Protein: 5.9 g/dL — ABNORMAL LOW (ref 6.0–8.3)

## 2013-10-14 LAB — CBC
HCT: 24.9 % — ABNORMAL LOW (ref 39.0–52.0)
Hemoglobin: 7.5 g/dL — ABNORMAL LOW (ref 13.0–17.0)
MCH: 21.2 pg — ABNORMAL LOW (ref 26.0–34.0)
MCHC: 30.1 g/dL (ref 30.0–36.0)
MCV: 70.5 fL — ABNORMAL LOW (ref 78.0–100.0)
Platelets: 421 10*3/uL — ABNORMAL HIGH (ref 150–400)
RBC: 3.53 MIL/uL — ABNORMAL LOW (ref 4.22–5.81)
RDW: 17.7 % — AB (ref 11.5–15.5)
WBC: 12.1 10*3/uL — AB (ref 4.0–10.5)

## 2013-10-14 LAB — PREALBUMIN: Prealbumin: 12.4 mg/dL — ABNORMAL LOW (ref 17.0–34.0)

## 2013-10-14 LAB — MAGNESIUM: Magnesium: 2 mg/dL (ref 1.5–2.5)

## 2013-10-14 LAB — SURGICAL PCR SCREEN
MRSA, PCR: NEGATIVE
Staphylococcus aureus: NEGATIVE

## 2013-10-14 LAB — PREPARE RBC (CROSSMATCH)

## 2013-10-14 LAB — PHOSPHORUS: Phosphorus: 3.6 mg/dL (ref 2.3–4.6)

## 2013-10-14 LAB — TRIGLYCERIDES: TRIGLYCERIDES: 173 mg/dL — AB (ref <150)

## 2013-10-14 SURGERY — LAPAROTOMY, EXPLORATORY
Anesthesia: General | Site: Abdomen

## 2013-10-14 MED ORDER — ROCURONIUM BROMIDE 100 MG/10ML IV SOLN
INTRAVENOUS | Status: DC | PRN
  Administered 2013-10-14: 50 mg via INTRAVENOUS
  Administered 2013-10-14: 10 mg via INTRAVENOUS

## 2013-10-14 MED ORDER — HYDROMORPHONE HCL PF 1 MG/ML IJ SOLN: 0.2500 mg | INTRAMUSCULAR | Status: DC | PRN

## 2013-10-14 MED ORDER — LACTATED RINGERS IV SOLN
INTRAVENOUS | Status: DC | PRN
  Administered 2013-10-14 (×2): via INTRAVENOUS

## 2013-10-14 MED ORDER — CLINIMIX E/DEXTROSE (5/15) 5 % IV SOLN
INTRAVENOUS | Status: DC
  Filled 2013-10-14: qty 2000

## 2013-10-14 MED ORDER — PHENYLEPHRINE 40 MCG/ML (10ML) SYRINGE FOR IV PUSH (FOR BLOOD PRESSURE SUPPORT)
PREFILLED_SYRINGE | INTRAVENOUS | Status: AC
  Filled 2013-10-14: qty 10

## 2013-10-14 MED ORDER — GLYCOPYRROLATE 0.2 MG/ML IJ SOLN
INTRAMUSCULAR | Status: DC | PRN
  Administered 2013-10-14: .6 mg via INTRAVENOUS

## 2013-10-14 MED ORDER — SODIUM CHLORIDE 0.9 % IJ SOLN: 9.0000 mL | INTRAMUSCULAR | Status: DC | PRN

## 2013-10-14 MED ORDER — FENTANYL CITRATE 0.05 MG/ML IJ SOLN
25.0000 ug | INTRAMUSCULAR | Status: DC | PRN
Start: 2013-10-14 — End: 2013-10-14
  Administered 2013-10-14 (×3): 25 ug via INTRAVENOUS

## 2013-10-14 MED ORDER — ROCURONIUM BROMIDE 50 MG/5ML IV SOLN
INTRAVENOUS | Status: AC
  Filled 2013-10-14: qty 1

## 2013-10-14 MED ORDER — HYDROMORPHONE 0.3 MG/ML IV SOLN
INTRAVENOUS | Status: AC
  Administered 2013-10-14: 21:00:00
  Filled 2013-10-14: qty 25

## 2013-10-14 MED ORDER — FENTANYL CITRATE 0.05 MG/ML IJ SOLN
INTRAMUSCULAR | Status: DC | PRN
  Administered 2013-10-14 (×2): 100 ug via INTRAVENOUS
  Administered 2013-10-14 (×3): 50 ug via INTRAVENOUS

## 2013-10-14 MED ORDER — ONDANSETRON HCL 4 MG/2ML IJ SOLN: 4.0000 mg | Freq: Four times a day (QID) | INTRAMUSCULAR | Status: DC | PRN

## 2013-10-14 MED ORDER — DIPHENHYDRAMINE HCL 12.5 MG/5ML PO ELIX: 12.5000 mg | ORAL_SOLUTION | Freq: Four times a day (QID) | ORAL | Status: DC | PRN

## 2013-10-14 MED ORDER — NEOSTIGMINE METHYLSULFATE 1 MG/ML IJ SOLN
INTRAMUSCULAR | Status: DC | PRN
  Administered 2013-10-14: 5 mg via INTRAVENOUS

## 2013-10-14 MED ORDER — PHENYLEPHRINE HCL 10 MG/ML IJ SOLN
INTRAMUSCULAR | Status: DC | PRN
  Administered 2013-10-14 (×3): 80 ug via INTRAVENOUS

## 2013-10-14 MED ORDER — DEXTROSE-NACL 5-0.45 % IV SOLN
INTRAVENOUS | Status: DC
  Administered 2013-10-15: 20 mL/h via INTRAVENOUS
  Administered 2013-10-15 – 2013-10-17 (×3): via INTRAVENOUS

## 2013-10-14 MED ORDER — FENTANYL CITRATE 0.05 MG/ML IJ SOLN
INTRAMUSCULAR | Status: AC
  Filled 2013-10-14: qty 2

## 2013-10-14 MED ORDER — HYDROMORPHONE 0.3 MG/ML IV SOLN
INTRAVENOUS | Status: DC
  Administered 2013-10-14: 3.3 mg via INTRAVENOUS
  Administered 2013-10-14: 14:00:00 via INTRAVENOUS
  Administered 2013-10-15: 2.1 mg via INTRAVENOUS
  Administered 2013-10-15: 18:00:00 via INTRAVENOUS
  Administered 2013-10-15: 4.5 mg via INTRAVENOUS
  Administered 2013-10-15: 2.9 mg via INTRAVENOUS
  Administered 2013-10-15: 09:00:00 via INTRAVENOUS
  Administered 2013-10-15: 3.9 mg via INTRAVENOUS
  Administered 2013-10-15: 1.9 mg via INTRAVENOUS
  Administered 2013-10-16: 1.2 mg via INTRAVENOUS
  Administered 2013-10-16 (×2): 1.8 mg via INTRAVENOUS
  Administered 2013-10-16: 3 mg via INTRAVENOUS
  Administered 2013-10-16: 0.9 mg via INTRAVENOUS
  Administered 2013-10-16: 2.4 mg via INTRAVENOUS
  Administered 2013-10-16: 08:00:00 via INTRAVENOUS
  Administered 2013-10-17 (×2): 1.8 mg via INTRAVENOUS
  Administered 2013-10-17: 3 mg via INTRAVENOUS
  Administered 2013-10-17: 12:00:00 via INTRAVENOUS
  Administered 2013-10-17: 3 mg via INTRAVENOUS
  Administered 2013-10-17: 2.1 mg via INTRAVENOUS
  Administered 2013-10-18 (×2): 1.5 mg via INTRAVENOUS
  Administered 2013-10-18: 5.1 mg via INTRAVENOUS
  Administered 2013-10-18: 2.1 mg via INTRAVENOUS
  Filled 2013-10-14 (×7): qty 25

## 2013-10-14 MED ORDER — FAT EMULSION 20 % IV EMUL
240.0000 mL | INTRAVENOUS | Status: AC
  Administered 2013-10-14: 240 mL via INTRAVENOUS
  Filled 2013-10-14: qty 250

## 2013-10-14 MED ORDER — PROPOFOL 10 MG/ML IV BOLUS
INTRAVENOUS | Status: DC | PRN
  Administered 2013-10-14: 180 mg via INTRAVENOUS

## 2013-10-14 MED ORDER — M.V.I. ADULT IV INJ
INTRAVENOUS | Status: AC
  Administered 2013-10-14: 18:00:00 via INTRAVENOUS
  Filled 2013-10-14: qty 1000

## 2013-10-14 MED ORDER — FENTANYL CITRATE 0.05 MG/ML IJ SOLN
INTRAMUSCULAR | Status: AC
  Filled 2013-10-14: qty 5

## 2013-10-14 MED ORDER — FAT EMULSION 20 % IV EMUL
240.0000 mL | INTRAVENOUS | Status: DC
Start: 2013-10-14 — End: 2013-10-14
  Filled 2013-10-14: qty 250

## 2013-10-14 MED ORDER — ONDANSETRON HCL 4 MG/2ML IJ SOLN
INTRAMUSCULAR | Status: AC
  Filled 2013-10-14: qty 2

## 2013-10-14 MED ORDER — LIDOCAINE HCL (CARDIAC) 20 MG/ML IV SOLN
INTRAVENOUS | Status: DC | PRN
  Administered 2013-10-14: 40 mg via INTRAVENOUS

## 2013-10-14 MED ORDER — ENOXAPARIN SODIUM 40 MG/0.4ML ~~LOC~~ SOLN
40.0000 mg | SUBCUTANEOUS | Status: DC
  Administered 2013-10-15 – 2013-10-18 (×4): 40 mg via SUBCUTANEOUS
  Filled 2013-10-14 (×6): qty 0.4

## 2013-10-14 MED ORDER — SUCCINYLCHOLINE CHLORIDE 20 MG/ML IJ SOLN
INTRAMUSCULAR | Status: DC | PRN
  Administered 2013-10-14: 100 mg via INTRAVENOUS

## 2013-10-14 MED ORDER — POTASSIUM CHLORIDE 10 MEQ/50ML IV SOLN
10.0000 meq | INTRAVENOUS | Status: AC
  Filled 2013-10-14 (×2): qty 50

## 2013-10-14 MED ORDER — LIDOCAINE HCL (CARDIAC) 20 MG/ML IV SOLN
INTRAVENOUS | Status: AC
  Filled 2013-10-14: qty 5

## 2013-10-14 MED ORDER — 0.9 % SODIUM CHLORIDE (POUR BTL) OPTIME
TOPICAL | Status: DC | PRN
  Administered 2013-10-14 (×4): 1000 mL

## 2013-10-14 MED ORDER — GLYCOPYRROLATE 0.2 MG/ML IJ SOLN
INTRAMUSCULAR | Status: AC
  Filled 2013-10-14: qty 3

## 2013-10-14 MED ORDER — MIDAZOLAM HCL 2 MG/2ML IJ SOLN
INTRAMUSCULAR | Status: AC
  Filled 2013-10-14: qty 2

## 2013-10-14 MED ORDER — NALOXONE HCL 0.4 MG/ML IJ SOLN: 0.4000 mg | INTRAMUSCULAR | Status: DC | PRN

## 2013-10-14 MED ORDER — DIPHENHYDRAMINE HCL 50 MG/ML IJ SOLN
12.5000 mg | Freq: Four times a day (QID) | INTRAMUSCULAR | Status: DC | PRN
  Administered 2013-10-17: 12.5 mg via INTRAVENOUS
  Filled 2013-10-14: qty 1

## 2013-10-14 MED ORDER — MIDAZOLAM HCL 5 MG/5ML IJ SOLN
INTRAMUSCULAR | Status: DC | PRN
  Administered 2013-10-14 (×2): 1 mg via INTRAVENOUS

## 2013-10-14 MED ORDER — ALBUMIN HUMAN 5 % IV SOLN
INTRAVENOUS | Status: DC | PRN
  Administered 2013-10-14: 11:00:00 via INTRAVENOUS

## 2013-10-14 MED ORDER — ONDANSETRON HCL 4 MG/2ML IJ SOLN
INTRAMUSCULAR | Status: DC | PRN
  Administered 2013-10-14: 4 mg via INTRAVENOUS

## 2013-10-14 MED ORDER — PROPOFOL 10 MG/ML IV BOLUS
INTRAVENOUS | Status: AC
  Filled 2013-10-14: qty 20

## 2013-10-14 MED ORDER — DEXTROSE 5 % IV SOLN
2.0000 g | Freq: Two times a day (BID) | INTRAVENOUS | Status: AC
  Administered 2013-10-14: 2 g via INTRAVENOUS
  Filled 2013-10-14: qty 2

## 2013-10-14 MED ORDER — POTASSIUM CHLORIDE 10 MEQ/50ML IV SOLN
10.0000 meq | INTRAVENOUS | Status: AC
  Administered 2013-10-14 (×2): 10 meq via INTRAVENOUS
  Filled 2013-10-14 (×2): qty 50

## 2013-10-14 MED ORDER — BACITRACIN ZINC 500 UNIT/GM EX OINT
TOPICAL_OINTMENT | CUTANEOUS | Status: AC
  Filled 2013-10-14: qty 15

## 2013-10-14 SURGICAL SUPPLY — 60 items
BLADE SURG ROTATE 9660 (MISCELLANEOUS) ×3 IMPLANT
CANISTER SUCTION 2500CC (MISCELLANEOUS) ×3 IMPLANT
CHLORAPREP W/TINT 26ML (MISCELLANEOUS) ×3 IMPLANT
COVER MAYO STAND STRL (DRAPES) ×3 IMPLANT
COVER SURGICAL LIGHT HANDLE (MISCELLANEOUS) ×6 IMPLANT
DRAPE LAPAROSCOPIC ABDOMINAL (DRAPES) ×3 IMPLANT
DRAPE PROXIMA HALF (DRAPES) ×6 IMPLANT
DRAPE UTILITY 15X26 W/TAPE STR (DRAPE) ×15 IMPLANT
DRAPE WARM FLUID 44X44 (DRAPE) ×3 IMPLANT
DRESSING TELFA 8X3 (GAUZE/BANDAGES/DRESSINGS) ×3 IMPLANT
DRSG OPSITE POSTOP 4X10 (GAUZE/BANDAGES/DRESSINGS) IMPLANT
DRSG OPSITE POSTOP 4X8 (GAUZE/BANDAGES/DRESSINGS) IMPLANT
ELECT BLADE 6.5 EXT (BLADE) ×3 IMPLANT
ELECT CAUTERY BLADE 6.4 (BLADE) ×6 IMPLANT
ELECT REM PT RETURN 9FT ADLT (ELECTROSURGICAL) ×3
ELECTRODE REM PT RTRN 9FT ADLT (ELECTROSURGICAL) ×1 IMPLANT
GLOVE BIO SURGEON STRL SZ7 (GLOVE) ×6 IMPLANT
GLOVE BIO SURGEON STRL SZ7.5 (GLOVE) ×9 IMPLANT
GLOVE BIOGEL M STRL SZ7.5 (GLOVE) ×6 IMPLANT
GLOVE BIOGEL PI IND STRL 6.5 (GLOVE) ×1 IMPLANT
GLOVE BIOGEL PI IND STRL 7.0 (GLOVE) ×1 IMPLANT
GLOVE BIOGEL PI IND STRL 7.5 (GLOVE) ×4 IMPLANT
GLOVE BIOGEL PI IND STRL 8 (GLOVE) ×2 IMPLANT
GLOVE BIOGEL PI INDICATOR 6.5 (GLOVE) ×2
GLOVE BIOGEL PI INDICATOR 7.0 (GLOVE) ×2
GLOVE BIOGEL PI INDICATOR 7.5 (GLOVE) ×8
GLOVE BIOGEL PI INDICATOR 8 (GLOVE) ×4
GLOVE SURG SS PI 6.0 STRL IVOR (GLOVE) ×3 IMPLANT
GOWN L4 XXLG W/PAP TWL (GOWN DISPOSABLE) ×6 IMPLANT
GOWN STRL REUS W/ TWL LRG LVL3 (GOWN DISPOSABLE) ×5 IMPLANT
GOWN STRL REUS W/TWL LRG LVL3 (GOWN DISPOSABLE) ×10
KIT BASIN OR (CUSTOM PROCEDURE TRAY) ×3 IMPLANT
KIT ROOM TURNOVER OR (KITS) ×3 IMPLANT
LIGASURE IMPACT 36 18CM CVD LR (INSTRUMENTS) ×3 IMPLANT
NS IRRIG 1000ML POUR BTL (IV SOLUTION) ×6 IMPLANT
PACK GENERAL/GYN (CUSTOM PROCEDURE TRAY) ×3 IMPLANT
PAD ABD 8X10 STRL (GAUZE/BANDAGES/DRESSINGS) ×6 IMPLANT
PAD ARMBOARD 7.5X6 YLW CONV (MISCELLANEOUS) ×3 IMPLANT
PENCIL BUTTON HOLSTER BLD 10FT (ELECTRODE) ×3 IMPLANT
RELOAD PROXIMATE 75MM BLUE (ENDOMECHANICALS) ×6 IMPLANT
SEPRAFILM PROCEDURAL PACK 3X5 (MISCELLANEOUS) ×3 IMPLANT
SPECIMEN JAR LARGE (MISCELLANEOUS) IMPLANT
SPONGE GAUZE 4X4 12PLY (GAUZE/BANDAGES/DRESSINGS) ×3 IMPLANT
SPONGE LAP 18X18 X RAY DECT (DISPOSABLE) ×12 IMPLANT
STAPLER GUN LINEAR PROX 60 (STAPLE) ×3 IMPLANT
STAPLER PROXIMATE 75MM BLUE (STAPLE) ×3 IMPLANT
STAPLER VISISTAT 35W (STAPLE) ×3 IMPLANT
SUCTION POOLE TIP (SUCTIONS) ×3 IMPLANT
SUT PDS AB 1 TP1 96 (SUTURE) ×6 IMPLANT
SUT SILK 2 0 SH CR/8 (SUTURE) ×6 IMPLANT
SUT SILK 2 0 TIES 10X30 (SUTURE) ×3 IMPLANT
SUT SILK 3 0 SH CR/8 (SUTURE) ×3 IMPLANT
SUT SILK 3 0 TIES 10X30 (SUTURE) ×3 IMPLANT
SUT VIC AB 3-0 SH 18 (SUTURE) IMPLANT
TAPE CLOTH SURG 6X10 WHT LF (GAUZE/BANDAGES/DRESSINGS) ×3 IMPLANT
TOWEL OR 17X26 10 PK STRL BLUE (TOWEL DISPOSABLE) ×3 IMPLANT
TRAY FOLEY CATH 16FRSI W/METER (SET/KITS/TRAYS/PACK) ×3 IMPLANT
TUBE CONNECTING 12'X1/4 (SUCTIONS) ×1
TUBE CONNECTING 12X1/4 (SUCTIONS) ×2 IMPLANT
YANKAUER SUCT BULB TIP NO VENT (SUCTIONS) ×6 IMPLANT

## 2013-10-14 NOTE — Brief Op Note (Addendum)
10/12/2013 - 10/14/2013  1:03 PM  PATIENT:  Dean Owens  68 y.o. male  PRE-OPERATIVE DIAGNOSIS:  BOWEL OBSTRUCTION  POST-OPERATIVE DIAGNOSIS:  PARTIAL SMALL BOWEL OBSTRUCTION DUE TO MALIGNANT SMALL BOWEL MASS/TUMOR  PROCEDURE:  Procedure(s) with comments: EXPLORATORY LAPAROTOMY (N/A) LYSIS OF ADHESION (N/A) - 1 HOUR SMALL BOWEL RESECTION (N/A)  SURGEON:  Surgeon(s) and Role:    * Rolm Bookbinder, MD - Assisting    * [name], MD - Primary  PHYSICIAN ASSISTANT: none  ASSISTANTS: see above   ANESTHESIA:   general  EBL:  Total I/O In: 2500 [I.V.:2000; IV Piggyback:500] Out: 450 [Urine:300; Blood:150]  BLOOD ADMINISTERED:none  DRAINS: Nasogastric Tube and Urinary Catheter (Foley)   LOCAL MEDICATIONS USED:  NONE  SPECIMEN:  Source of Specimen:  mid small bowel - stitch proximal margin  DISPOSITION OF SPECIMEN:  PATHOLOGY  COUNTS:  YES  TOURNIQUET:  * No tourniquets in log *  DICTATION: .Other Dictation: Dictation Number [id number]  PLAN OF CARE: pacu then back to floor  PATIENT DISPOSITION:  PACU - hemodynamically stable.   Delay start of Pharmacological VTE agent (>24hrs) due to surgical blood loss or risk of bleeding: no  Leighton Ruff. Redmond Pulling, MD, FACS General, Bariatric, & Minimally Invasive Surgery [facility], [place]

## 2013-10-14 NOTE — Anesthesia Preprocedure Evaluation (Signed)
Anesthesia Evaluation  Patient identified by MRN, date of birth, ID band Patient awake    Reviewed: Allergy & Precautions, H&P , NPO status , Patient's Chart, lab work & pertinent test results  Airway Mallampati: II      Dental   Pulmonary former smoker,  breath sounds clear to auscultation        Cardiovascular hypertension, Rhythm:Regular Rate:Normal     Neuro/Psych    GI/Hepatic   Endo/Other  diabetesHypothyroidism   Renal/GU      Musculoskeletal   Abdominal   Peds  Hematology   Anesthesia Other Findings   Reproductive/Obstetrics                           Anesthesia Physical Anesthesia Plan  ASA: III  Anesthesia Plan: General   Post-op Pain Management:    Induction: Intravenous  Airway Management Planned: Oral ETT  Additional Equipment:   Intra-op Plan:   Post-operative Plan: Possible Post-op intubation/ventilation  Informed Consent: I have reviewed the patients History and Physical, chart, labs and discussed the procedure including the risks, benefits and alternatives for the proposed anesthesia with the patient or authorized representative who has indicated his/her understanding and acceptance.   Dental advisory given  Plan Discussed with: CRNA, Anesthesiologist and Surgeon  Anesthesia Plan Comments:         Anesthesia Quick Evaluation

## 2013-10-14 NOTE — Anesthesia Postprocedure Evaluation (Signed)
  Anesthesia Post-op Note  Patient: Dean Owens  Procedure(s) Performed: Procedure(s): EXPLORATORY LAPAROTOMY (N/A) SMALL BOWEL RESECTION (N/A) LYSIS OF ADHESION (N/A)  Patient Location: PACU  Anesthesia Type:General  Level of Consciousness: awake  Airway and Oxygen Therapy: Patient Spontanous Breathing  Post-op Pain: mild  Post-op Assessment: Post-op Vital signs reviewed  Post-op Vital Signs: Reviewed  Complications: No apparent anesthesia complications

## 2013-10-14 NOTE — Anesthesia Procedure Notes (Signed)
Procedure Name: Intubation Date/Time: 10/14/2013 10:20 AM Performed by: Izora Gala Pre-anesthesia Checklist: Patient identified, Emergency Drugs available, Suction available, Patient being monitored and Timeout performed Patient Re-evaluated:Patient Re-evaluated prior to inductionOxygen Delivery Method: Circle system utilized Preoxygenation: Pre-oxygenation with 100% oxygen Intubation Type: IV induction Ventilation: Mask ventilation without difficulty Laryngoscope Size: Miller and 3 Tube type: Oral Tube size: 8.0 mm Number of attempts: 1 Airway Equipment and Method: Stylet Placement Confirmation: ETT inserted through vocal cords under direct vision and positive ETCO2 Secured at: 23 cm Tube secured with: Tape Dental Injury: Teeth and Oropharynx as per pre-operative assessment

## 2013-10-14 NOTE — Progress Notes (Signed)
Patient ID: Dean Owens, male   DOB: November 01, 1945, 68 y.o.   MRN: [id number] Patient has severe protein calorie malnutrition due to chronic illness. Georganna Skeans, MD, MPH, FACS Pager: [phone]

## 2013-10-14 NOTE — Progress Notes (Signed)
Subjective: Pt doing well feels pretty good.  No N/V.  Ambulating well, using IS.  NPO on TPN.  Ready for surgery and denies any questions/concerns.    Objective: Vital signs in last 24 hours: Temp:  [97.8 F (36.6 C)-98.2 F (36.8 C)] 98.2 F (36.8 C) (02/19 0507) Pulse Rate:  [68-79] 68 (02/19 0507) Resp:  [18] 18 (02/19 0507) BP: (128-142)/(58-72) 132/63 mmHg (02/19 0507) SpO2:  [99 %-100 %] 99 % (02/19 0507) Last BM Date: 10/13/13  Intake/Output from previous day: 02/18 0701 - 02/19 0700 In: 3679 [P.O.:240; I.V.:2921; TPN:518] Out: 1275 [Urine:300; Emesis/NG output:975] Intake/Output this shift:    PE: Gen:  Alert, NAD, pleasant Abd: Soft, mild tenderness in the left side of abdomen, mild distension, few BS, no HSM, multiple abdominal scars noted   Lab Results:   Recent Labs  10/13/13 0450 10/14/13 0546  WBC 12.7* 12.1*  HGB 8.6* 7.5*  HCT 27.7* 24.9*  PLT 464* 421*   BMET  Recent Labs  10/13/13 0450 10/13/13 1400 10/14/13 0546  NA 137  --  135*  K 3.5*  --  3.6*  CL 92*  --  95*  CO2 29  --  27  GLUCOSE 135*  --  187*  BUN 31*  --  20  CREATININE 2.49* 2.14* 1.64*  CALCIUM 9.1  --  8.9   PT/INR  Recent Labs  10/12/13 1457  LABPROT 14.4  INR 1.14   CMP     Component Value Date/Time   NA 135* 10/14/2013 0546   K 3.6* 10/14/2013 0546   CL 95* 10/14/2013 0546   CO2 27 10/14/2013 0546   GLUCOSE 187* 10/14/2013 0546   BUN 20 10/14/2013 0546   CREATININE 1.64* 10/14/2013 0546   CALCIUM 8.9 10/14/2013 0546   PROT 5.9* 10/14/2013 0546   ALBUMIN 2.6* 10/14/2013 0546   AST 16 10/14/2013 0546   ALT 12 10/14/2013 0546   ALKPHOS 88 10/14/2013 0546   BILITOT <0.2* 10/14/2013 0546   GFRNONAA 42* 10/14/2013 0546   GFRAA 48* 10/14/2013 0546   Lipase     Component Value Date/Time   LIPASE 42 10/12/2013 1425       Studies/Results: Ct Abdomen Pelvis Wo Contrast  10/12/2013   CLINICAL DATA:  Abdominal pain. Patient has not passed camera capsule. [name]  EXAM:  CT ABDOMEN AND PELVIS WITHOUT CONTRAST  TECHNIQUE: Multidetector CT imaging of the abdomen and pelvis was performed following the standard protocol without intravenous contrast.  COMPARISON:  DG ABD 2 VIEWS dated 10/11/2013  FINDINGS: Liver normal. Spleen normal. No focal pancreatic abnormality. Punctate calcifications no the pancreas no evidence of pancreatitis. No biliary distention. Gallbladder is nondistended. No pericholecystic fluid collections.  Adrenals normal. No focal renal lesions identified. No hydronephrosis. No obstructing ureteral stone. The bladder is nondistended. Prostate seeds are noted.  Aortic atherosclerotic vascular disease present.  No aneurysm.  Surgical clips are noted throughout the abdomen and pelvis. Appendix not visualized. Surgical sutures are in the rectosigmoid. Patient has had a prior colectomy. There is a 7.2 x 5.9 x 7.5 cm large soft tissue mass involving a small bowel loop in the lower left abdomen with resulting small bowel obstruction, best seen on image number 61/series 2. Differential diagnosis would include primary and metastatic small bowel neoplasm . Endoscopic camera is noted just above the a small bowel obstruction. Proximal small bowel obstruction is again present. The small bowel is distended up to 4.4 cm in diameter. No free air is identified.  Present in the right lower quadrant is a 10.3 x 7.5 x 15.1 cm mixed density mass with involvement of the right ileo psoas muscle. Adjacent fat planes stranding along the undersurface of the third portion of the duodenum is present. Differential diagnosis includes infection, old hemorrhage, and tumor. Primary and metastatic malignancy could present in this fashion. Retroperitoneal sarcoma and lymphoma could present in this fashion. PET-CT can be obtained for further evaluation.  Heart size is normal. Coronary artery disease. Passive atelectasis lung bases. Degenerative changes lumbar spine. Tiny umbilical hernia and left inguinal  hernia with herniation of fat only.  IMPRESSION: 1. 7.2 x 5.9 x 7.5 cm large soft tissue mass involving a small bowel loop in the left abdomen resulting in an apple core type obstruction of of the small bowel. Differential diagnosis would include primary and metastatic static small bowel malignancy. Endoscopic camera is noted just above the small bowel obstruction. Patient has had a prior colectomy.  2. 10.3 x 7.5 x 15.1 cm mixed density mass involving the right ileo psoas muscle. Differential diagnosis includes infection, old hemorrhage, and tumor. Primary and metastatic malignancy could present in this fashion. This would include retroperitoneal sarcoma and lymphoma. PET-CT would be useful for further evaluation of this patient.  3. Prostate seeds are noted. This is consistent with the patient's known history prostate cancer.  These results were called by telephone at the time of interpretation on 10/12/2013 at 5:18 PM to Dr. Reather Converse , who verbally acknowledged these results.   Electronically Signed   By: Marcello Moores  Register   On: 10/12/2013 17:17    Anti-infectives: Anti-infectives   Start     Dose/Rate Route Frequency Ordered Stop   10/14/13 0600  cefOXitin (MEFOXIN) 2 g in dextrose 5 % 50 mL IVPB     2 g 100 mL/hr over 30 Minutes Intravenous On call to O.R. 10/13/13 1219 10/15/13 0559       Assessment/Plan 1. Intra-abdominal masses, likely metastatic  2. SBO, likely malignant secondary to #1  3. ARI, likely due to dehydration - Cr. 1.64 4. H/o colon cancer x3  5. Recurrent prostate cancer  6. HTN  7. DM  8. Acute on chronic anemia likely due to blood loss/iron deficiency  Plan:  1. PICC/TNA 2. Cont NGT and NPO status  3. CEA mildly elevated at 5.8 4. Will need operation this admission for palliative cause to relieve obstruction. If these are metastatic lesions, our surgery is not curable. I have thoroughly discussed this with the patient and his wife. They both seem to understand. I have  also mentioned the possibility of a G-tube, but told them that would be up to the MD, possibly at the time of surgery.  5.Continue IVFs for elevated creatinine  6. Patient will ultimately need a new oncologist if path shows malignancy  7. Proceed with ex lap today, possible small or large bowel resection, bypass, ostomy, g-tube 8. Talked with a coworker of Dr. Earle Gell who said they could coordinate getting the capsule endoscopy's and sigmoidoscopy/biopsy reports so that Dr. Redmond Pulling can see them before OR today     LOS: 2 days    D[place], [facility] 10/14/2013, 7:47 AM Pager: [phone]

## 2013-10-14 NOTE — Clinical Documentation Improvement (Signed)
THIS DOCUMENT IS NOT A PERMANENT PART OF THE MEDICAL RECORD  Please update your documentation with the medical record to reflect your response to this query. If you need help knowing how to do this please call [phone].  10/14/13   Dear Dr. Georganna Skeans / Associates,  In a better effort to capture your patient's severity of illness, reflect appropriate length of stay and utilization of resources, a review of the patient medical record has revealed the following indicators.    Based on your clinical judgment, please clarify and document in a progress note and/or discharge summary the clinical condition associated with the following supporting information:  You may use possible, probable, or suspect with inpatient documentation. possible, probable, suspected diagnoses MUST be documented at the time of discharge   In responding to this query please exercise your independent judgment.  The fact that a query is asked, does not imply that any particular answer is desired or expected.  Possible Clinical Conditions?  _______Severe Malnutrition  _______Severe Protein Calorie Malnutrition _______Other Condition________________ _______Cannot clinically determine     Supporting Information: Per 10/13/13 Nutritional Consult, patient meets criteria for Severe malnutrition in the context of chronic illness.   Inadequate oral intake related to SBO as evidenced by 9% weight loss in one month per pt report.   BMI: Body mass index is 28.47 kg/(m^2).  TPN per Pharmacy  RD to continue to monitor nutrition care plan     Reviewed: additional documentation in the medical record  Thank You,  [place] Documentation Specialist: [place]

## 2013-10-14 NOTE — Progress Notes (Signed)
[facility] NOTE  Pharmacy Consult for TPN  Indication: bowel obstruction  No Known Allergies  Patient Measurements: Height: 6' (182.9 cm) Weight: 210 lb (95.255 kg) IBW/kg (Calculated) : 77.6   Vital Signs: Temp: 98.2 F (36.8 C) (02/19 0507) Temp src: Oral (02/19 0507) BP: 132/63 mmHg (02/19 0507) Pulse Rate: 68 (02/19 0507) Intake/Output from previous day: 02/18 0701 - 02/19 0700 In: 3679 [P.O.:240; I.V.:2921; TPN:518] Out: 1275 [Urine:300; Emesis/NG output:975] Intake/Output from this shift: Total I/O In: -  Out: 225 [Urine:225]  Labs:  Recent Labs  10/12/13 1457 10/13/13 0450 10/14/13 0546  WBC 16.4* 12.7* 12.1*  HGB 9.0* 8.6* 7.5*  HCT 29.2* 27.7* 24.9*  PLT 535* 464* 421*  INR 1.14  --   --      Recent Labs  10/12/13 1425 10/13/13 0450 10/13/13 1400 10/14/13 0546  NA 133* 137  --  135*  K 4.7 3.5*  --  3.6*  CL 88* 92*  --  95*  CO2 26 29  --  27  GLUCOSE 198* 135*  --  187*  BUN 35* 31*  --  20  CREATININE 2.80* 2.49* 2.14* 1.64*  CALCIUM 9.8 9.1  --  8.9  MG  --   --   --  2.0  PHOS  --   --   --  3.6  PROT 7.6  --   --  5.9*  ALBUMIN 3.5  --   --  2.6*  AST 28  --   --  16  ALT 18  --   --  12  ALKPHOS 128*  --   --  88  BILITOT 0.3  --   --  <0.2*  TRIG  --   --   --  173*   Estimated Creatinine Clearance: 52.4 ml/min (by C-G formula based on Cr of 1.64).    Recent Labs  10/13/13 2334 10/14/13 0343 10/14/13 0727  GLUCAP 185* 188* 161*    GI: Admitted with 3 day h/o N&V.  He had recent procedure 2/6 for prostate cancer.  He also has a h/o colon cancer.  CT reveals 2 intra-adbominal masses likely tumor one of which is causing bowel obstruction. Also has retained endoscopic capsule.  To have ex lap today, possible small or large bowel resection, bypass, ostomy, g-tube.  Endo: h/o DM  On glipizide pta, cbgs 161 - 188,    9 units insulin in last 24 hours. Will add 20 units of insulin to 2 liter bag for tonight.   Lytes: Na 135, K 3.6, mag 2, phos 3.6 Renal: SrCr 1.64, coming down  CrCl ~52 ml/min Hepatobil: alk phos 88. Alb 2.6     Best Practices: IV ppi, scds TPN Access:  PICC line placed 2/18 TPN day#:2  Current Nutrition:  NPO except ice chips, clinimix E 5/15 at 40 ml/hr plus lipids at 5 ml/hr  Nutritional Goals: per RD 10/13/13 2380 -2560 kcals and 125 - 140 gm protein  Plan:  1. Increase clinimix E 5/15 to 80 ml/hr plus lipids at 10 ml/hr to provide 96 gm protein and 1843 kcals. 2. 2 runs of K now, check bmet, mag and phos in am 3. Goal rate will be 110 ml/hr to provide 132 gm protein and 2354 kcals = 100% support. 4. I have decreased D545 at 125 ml/hr to 50 ml/hr now and will further decrease rate to KVO once new TPN hung tonight at 1800. 5. Add 20 units of insulin to TPN 2  liter bag and  f/u cbgs Eudelia Bunch, Pharm.D. [phone] 10/14/2013 8:49 AM

## 2013-10-14 NOTE — Progress Notes (Signed)
All questions asked and answered Cr normalizing. Good uop.  abx on call Blood available  To OR for exp lap, possible bowel resection, possible bowel bypass, possible ostomy &/or feeding tube  Leighton Ruff. Redmond Pulling, MD, FACS General, Bariatric, & Minimally Invasive Surgery [facility], [place]

## 2013-10-14 NOTE — Transfer of Care (Signed)
Immediate Anesthesia Transfer of Care Note  Patient: Dean Owens  Procedure(s) Performed: Procedure(s) with comments: EXPLORATORY LAPAROTOMY (N/A) LYSIS OF ADHESION (N/A) - 1 HOUR SMALL BOWEL RESECTION (N/A)  Patient Location: PACU  Anesthesia Type:General  Level of Consciousness: awake, alert  and patient cooperative  Airway & Oxygen Therapy: Patient Spontanous Breathing and Patient connected to face mask oxygen  Post-op Assessment: Report given to PACU RN, Post -op Vital signs reviewed and stable and Patient moving all extremities  Post vital signs: Reviewed and stable  Complications: No apparent anesthesia complications

## 2013-10-15 ENCOUNTER — Encounter (HOSPITAL_COMMUNITY): Payer: Self-pay | Admitting: General Surgery

## 2013-10-15 DIAGNOSIS — E46 Unspecified protein-calorie malnutrition: Secondary | ICD-10-CM

## 2013-10-15 LAB — GLUCOSE, CAPILLARY
GLUCOSE-CAPILLARY: 149 mg/dL — AB (ref 70–99)
GLUCOSE-CAPILLARY: 184 mg/dL — AB (ref 70–99)
GLUCOSE-CAPILLARY: 226 mg/dL — AB (ref 70–99)
GLUCOSE-CAPILLARY: 251 mg/dL — AB (ref 70–99)
Glucose-Capillary: 209 mg/dL — ABNORMAL HIGH (ref 70–99)
Glucose-Capillary: 176 mg/dL — ABNORMAL HIGH (ref 70–99)

## 2013-10-15 LAB — BASIC METABOLIC PANEL
BUN: 24 mg/dL — AB (ref 6–23)
CALCIUM: 8.6 mg/dL (ref 8.4–10.5)
CO2: 23 meq/L (ref 19–32)
Chloride: 100 mEq/L (ref 96–112)
Creatinine, Ser: 1.47 mg/dL — ABNORMAL HIGH (ref 0.50–1.35)
GFR calc Af Amer: 55 mL/min — ABNORMAL LOW (ref >90)
GFR, EST NON AFRICAN AMERICAN: 48 mL/min — AB (ref >90)
GLUCOSE: 217 mg/dL — AB (ref 70–99)
Potassium: 4.9 mEq/L (ref 3.7–5.3)
SODIUM: 137 meq/L (ref 137–147)

## 2013-10-15 LAB — CBC
HEMATOCRIT: 27.2 % — AB (ref 39.0–52.0)
HEMOGLOBIN: 8 g/dL — AB (ref 13.0–17.0)
MCH: 20.9 pg — ABNORMAL LOW (ref 26.0–34.0)
MCHC: 29.4 g/dL — ABNORMAL LOW (ref 30.0–36.0)
MCV: 71 fL — AB (ref 78.0–100.0)
Platelets: 445 10*3/uL — ABNORMAL HIGH (ref 150–400)
RBC: 3.83 MIL/uL — ABNORMAL LOW (ref 4.22–5.81)
RDW: 17.7 % — AB (ref 11.5–15.5)
WBC: 19.6 10*3/uL — AB (ref 4.0–10.5)

## 2013-10-15 LAB — PHOSPHORUS: PHOSPHORUS: 3.5 mg/dL (ref 2.3–4.6)

## 2013-10-15 LAB — MAGNESIUM: Magnesium: 1.8 mg/dL (ref 1.5–2.5)

## 2013-10-15 MED ORDER — FAT EMULSION 20 % IV EMUL
240.0000 mL | INTRAVENOUS | Status: AC
Start: 2013-10-15 — End: 2013-10-16
  Administered 2013-10-15: 240 mL via INTRAVENOUS
  Filled 2013-10-15: qty 250

## 2013-10-15 MED ORDER — ACETAMINOPHEN 10 MG/ML IV SOLN
1000.0000 mg | Freq: Four times a day (QID) | INTRAVENOUS | Status: AC
  Administered 2013-10-15 – 2013-10-16 (×4): 1000 mg via INTRAVENOUS
  Filled 2013-10-15 (×4): qty 100

## 2013-10-15 MED ORDER — TRACE MINERALS CR-CU-F-FE-I-MN-MO-SE-ZN IV SOLN
INTRAVENOUS | Status: AC
  Administered 2013-10-15: 17:00:00 via INTRAVENOUS
  Filled 2013-10-15: qty 2000

## 2013-10-15 MED ORDER — SODIUM CHLORIDE 0.9 % IV BOLUS (SEPSIS)
500.0000 mL | Freq: Once | INTRAVENOUS | Status: AC
  Administered 2013-10-15: 500 mL via INTRAVENOUS

## 2013-10-15 MED ORDER — MAGNESIUM SULFATE 40 MG/ML IJ SOLN
2.0000 g | Freq: Once | INTRAMUSCULAR | Status: AC
  Administered 2013-10-15: 2 g via INTRAVENOUS
  Filled 2013-10-15: qty 50

## 2013-10-15 NOTE — Op Note (Signed)
Dean Owens, Dean Owens NO.:  [id number]  MEDICAL RECORD NO.:  [id number]  LOCATION:  6N31C                        FACILITY:  [facility]  PHYSICIAN:  Leighton Ruff. 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  PROCEDURES: 1. Exploratory laparotomy. 2. Lysis of adhesions for 1 hour. 3. Small bowel resection with side-to-side anastomosis.  SURGEON:  Leighton Ruff. Redmond Pulling, MD, FACS  ASSISTANT SURGEON:  Dickey Gave, MD  ANESTHESIA:  General.  ESTIMATED BLOOD LOSS:  150.  SPECIMEN:  Mid small bowel stitch marks proximal margin.  We did send it for gross analysis and there was the capsule endoscopy pill within the specimen.  INDICATIONS FOR PROCEDURE:  The patient is a pleasant 68 year old gentleman who has a remote history of colon cancer dating back to 51. He first underwent a right colectomy with side-to-side ileotransverse colonic anastomosis.  A few months later, he was found to have another colon cancer and underwent sigmoid resection with end-to-end anastomosis in March 1997.  After having 10 months of chemotherapy and a followup colonoscopy, he was found to have additional evidence of colon cancer and underwent a total intraabdominal colectomy with ileorectal anastomosis in May 1999.  He completed adjuvant therapy.  He unfortunately most recently has been diagnosed with recurrent prostate cancer.  Of note, over the past several months, it was noted that he had iron-deficiency anemia and had been managed by his gastroenterologist and a capsule endoscopy was performed.  However, the capsule endoscopy, capsule was never passed, and the patient had been undergoing serial imaging with no progression  of the capsule.  He had undergone a flexible sigmoidoscopy which did not reveal evidence of the capsule in the pelvis.  He just underwent the urology procedure on October 01, 2013, and shortly there after about 3 days ago, he developed nausea, vomiting, and abdominal pain.  He underwent CT scan which demonstrated two abdominal masses.  One was along his right psoas muscle in the retroperitoneum, the other one was intraabdominal incorporating his mid small bowel with capsule endoscopy, just proximal to the level of the obstruction.  He was in acute renal failure.  He was hydrated and his kidney function started to normalize.  Based on the fact that he had a small bowel obstruction probably due to malignant process, I recommended exploratory laparotomy with possible bowel resection versus surgical bypass, possible ostomy, possible feeding tube.  We had a prolonged discussion about the risks and benefits of surgery including, but not limited to, bleeding, infection, injury to surrounding structures, inability to resect the area, requiring bypass, anastomotic leak, anastomotic stricture, fistula formation, incisional hernia, wound complications, prolonged hospitalization, ileus, blood clot formation, anesthesia complications, and death.  We discussed that this procedure would not be for care, but more for palliative care in the sense of relieving his bowel obstruction to restore GI function.  He understood and desired to proceed to surgery.  DESCRIPTION OF PROCEDURE:  After obtaining  informed consent, the patient was taken to the operating room #1 at [facility], placed supine on the operating table.  General endotracheal anesthesia was established.  Sequential compression devices have been placed.  A Foley catheter had been placed.  His abdomen was prepped and draped in usual standard surgical fashion with ChloraPrep.  He received IV antibiotics prior to skin incision.   After a surgical time-out was performed, I incised his old midline incision with a #10 blade and carried it down through the subcutaneous tissue until I encountered the old fascial closure sutures.  These were cut.  I eventually was able to Place kochers on the fascial edge and gain entry to the abdominal cavity.  He had omental adhesions to his anterior abdominal wall.  We were able to fully gain access to the abdomen and then initially placed a Balfour retractor, but subsequently placed a Bookwalter retractor.  He had a dense layer of omentum that was essentially plastered to his entire abdomen which we took down in its entirety.  Some of his peritoneum was separated from his abdominal wall in the lower abdomen.  Then, we essentially just disconnected that, so we could lift the omentum out of the lower abdomen to fully expose the small bowel.  He had a number of intraabdominal interloop adhesions which were lysed with Metzenbaum scissors.  Again, it took over an hour to lyse all of the intraabdominal interloop adhesions that we are finally able to free up the majority of the intraabdominal small bowel, with the exception of the distal small bowel that was downstreamed from the point of obstruction which was mainly on the right side of the abdomen.  He had a large bulky mass in the left abdomen that was incorporating a section of mid small bowel. We identified the ligament of Treitz and ran it starting there and running it distally.  It looked like the small bowel tumor was involving the mid jejunum.  It appeared he had ample distal small bowel, so we therefore felt that we could proceed with resection of the small bowel tumor en bloc and avoid a surgical bypass.  We mobilized the mass laterally from the peritoneal attachments along the white line of Toldt with electrocautery as well as with Metzenbaum.  We then freed it from the peritoneum with a right angle and LigaSure as well as  Bovie electrocautery.  This essentially mobilized.  We made sure that the proximal bowel was free of adhesions and the distal bowel for about 30- 40 cm was free of adhesions.  We then divided the proximal about the 10 cm upstream from the point of obstruction with the GIA-75 stapler blue load.  I then divided about 10 cm distal to the obstruction again in a similar fashion with the GIA 75 stapler with a blue load.  We then came to the mesentery of the LigaSure device.  There was some bleeding from the mesentery which was dealt with LigaSure.  The mass was freed in its entirety.  A 2-0 silk was placed on the proximal end of the specimen. We sent it down for gross exam just to make sure that the capsule endoscopy pill was within the specimen and pathology alerted to [place] that it was.  At this point, we elected to reconnect the bowel.  A side-to- side anastomosis was performed.  Enterotomies were made just in front of the staple line with electrocautery.  One fork of the GIA-75 stapler with a blue  load was placed through each enterotomy.  The stapler was brought together and fired to create a common channel.  There was no bleeding along the anastomosis.  A TA-60 stapler was used to close the common defect.  We then changed gloves and gowns and redraped the patient and got a new Bovie and suction.  We then closed the mesenteric defect with interrupted 2-0 silk sutures.  We had placed two crotch sutures of 3-0 silk suture.  We then re-ran the bowel.  There was no evidence of serosal tears.  The abdomen was copiously irrigated.  I did not see any feeding tube or diverting ostomy.  There was no evidence of downstream obstruction.  The omentum that was coming off the stomach was then placed over the small intestine.  Seprafilm was then placed in the midline.  We then closed the fascia with #1 looped PDS x2. Irrigated the subcutaneous tissue and closed the skin with skin staples. The patient was  extubated and taken to the recovery room in stable condition.  All needle, instrument, and sponge counts were correct x2. There were no immediate complications.  The patient tolerated the procedure well.     Leighton Ruff. Redmond Pulling, MD, FACS     EMW/MEDQ  D:  10/14/2013  T:  10/15/2013  Job:  [id number]  cc:   Nehemiah Settle, M.D. Earle Gell, M.D.

## 2013-10-15 NOTE — Progress Notes (Signed)
Patient received 500 cc NS bolus as ordered, 100 ccs of urine emptied from foley since receiving bolus.  Night nurse made aware of 300cc l urine output during dayshift and will continue to monitor.

## 2013-10-15 NOTE — Progress Notes (Signed)
Notified surgeon that patient had only put out 200 ccs of urine from 6 a.m. To 2 p.m. Order received for 500 cc bolus.  Will continue to monitor.

## 2013-10-15 NOTE — Progress Notes (Signed)
Didn't sleep well No n/v.  Has pain when moves No flatus  Alert, nad cta b/l Reg Distended. Dressing c/d/i Not much in NG  Malignant small bowel tumor causing obstruction s/p exp lap, LOA x 1hr, SB resection ARF  Kidney functioning improving. Will increase mIVF to 50cc/hr with TPN. Keep foley til sat. Should be able to decrease mIVF once Cr normalizes GI - cont ng tube. Can have ice chips. Await return of bowel function OOB, IS Add IV Tylenol  hgb ok VTE prophy - lovenox, scd  Leighton Ruff. Redmond Pulling, MD, FACS General, Bariatric, & Minimally Invasive Surgery [facility], [place]

## 2013-10-15 NOTE — Progress Notes (Signed)
PARENTERAL NUTRITION CONSULT NOTE  Pharmacy Consult for TPN  Indication: bowel obstruction  No Known Allergies  Patient Measurements: Height: 6' (182.9 cm) Weight: 210 lb (95.255 kg) IBW/kg (Calculated) : 77.6   Vital Signs: Temp: 98.1 F (36.7 C) (02/20 0448) Temp src: Oral (02/20 0448) BP: 116/64 mmHg (02/20 0448) Pulse Rate: 105 (02/20 0448) Intake/Output from previous day: 02/19 0701 - 02/20 0700 In: 4291.3 [I.V.:2182; IV Piggyback:500; TPN:1609.3] Out: 1810 [Urine:1360; Emesis/NG output:300; Blood:150] Intake/Output from this shift:    Labs:  Recent Labs  10/12/13 1457 10/13/13 0450 10/14/13 0546 10/15/13 0350  WBC 16.4* 12.7* 12.1* 19.6*  HGB 9.0* 8.6* 7.5* 8.0*  HCT 29.2* 27.7* 24.9* 27.2*  PLT 535* 464* 421* 445*  INR 1.14  --   --   --      Recent Labs  10/12/13 1425 10/13/13 0450 10/13/13 1400 10/14/13 0546 10/15/13 0350  NA 133* 137  --  135* 137  K 4.7 3.5*  --  3.6* 4.9  CL 88* 92*  --  95* 100  CO2 26 29  --  27 23  GLUCOSE 198* 135*  --  187* 217*  BUN 35* 31*  --  20 24*  CREATININE 2.80* 2.49* 2.14* 1.64* 1.47*  CALCIUM 9.8 9.1  --  8.9 8.6  MG  --   --   --  2.0 1.8  PHOS  --   --   --  3.6 3.5  PROT 7.6  --   --  5.9*  --   ALBUMIN 3.5  --   --  2.6*  --   AST 28  --   --  16  --   ALT 18  --   --  12  --   ALKPHOS 128*  --   --  88  --   BILITOT 0.3  --   --  <0.2*  --   PREALBUMIN  --   --   --  12.4*  --   TRIG  --   --   --  173*  --    Estimated Creatinine Clearance: 58.4 ml/min (by C-G formula based on Cr of 1.47).    Recent Labs  10/15/13 0003 10/15/13 0343 10/15/13 0753  GLUCAP 251* 209* 176*    GI: Admitted with 3 day h/o N&V.  He had recent procedure 2/6 for prostate cancer.  He also has a h/o colon cancer.  CT reveals 2 intra-adbominal masses likely tumor one of which is causing bowel obstruction. Also has retained endoscopic capsule.  S/p  small bowel resection 2/19, NPO with NG tube  Endo: h/o DM  On  glipizide pta, cbgs 176-271,    10 units insulin in last 24 hours.  20 units of insulin added to 1 liter bag yesterday, TPN ran out early and RN instructed to hang D10 at same rate Lytes: Na 137 K 4.9, mag 1.8, phos 3.5  Pre alb 12.4 Renal: SrCr 1.47, coming down  CrCl ~58 ml/min Hepatobil: alk phos 88. Alb 2.6  TG 173   Best Practices: IV ppi, scds TPN Access:  PICC line placed 2/18 TPN day#:3  Current Nutrition:  NPO, clinimix E 5/15 at 80 ml/hr plus lipids at 10 ml/hr  Bag ran out early as 1 liter bag dispensed  Nutritional Goals: per RD 10/13/13 2380 -2560 kcals and 125 - 140 gm protein  Plan:  1. Cont clinimix E 5/15 at 80 ml/hr plus lipids at 10 ml/hr to provide 96 gm  protein and 1843 kcals. 2. Goal rate will be 110 ml/hr to provide 132 gm protein and 2354 kcals = 100% support. 3. Trace elements on MWF only due to shortage 4.  30 units of insulin to TPN 2 liter bag and  f/u cbgs  Robin Pafford, Pharm.D. [phone] 10/15/2013 8:09 AM

## 2013-10-15 NOTE — Progress Notes (Signed)
1 Day Post-Op  Subjective: Pt c/o exquisite tenderness.  No N/V.  NPO with NG tube. Wants ice chips.  Using PCA helps to control the pain, but pain mostly in left side of abdomen.  Hasn't ambulated yet, foley in.    Objective: Vital signs in last 24 hours: Temp:  [97.6 F (36.4 C)-99.2 F (37.3 C)] 98.1 F (36.7 C) (02/20 0448) Pulse Rate:  [76-116] 105 (02/20 0448) Resp:  [16-25] 22 (02/20 0448) BP: (116-163)/(57-79) 116/64 mmHg (02/20 0448) SpO2:  [94 %-100 %] 99 % (02/20 0448) Last BM Date: 10/12/13  Intake/Output from previous day: 02/19 0701 - 02/20 0700 In: 4291.3 [I.V.:2182; IV Piggyback:500; TPN:1609.3] Out: 1810 [Urine:1360; Emesis/NG output:300; Blood:150] Intake/Output this shift:    PE: Gen:  Alert, NAD, pleasant Card:  Regular rhythm, tachy, no M/G/R heard Pulm:  CTA, no W/R/R, IS to 1250 Abd: Soft, moderate tenderness throughout, moderate distension, diminished BS, no HSM, abdominal dry dressing in place C/D/I   Lab Results:   Recent Labs  10/14/13 0546 10/15/13 0350  WBC 12.1* 19.6*  HGB 7.5* 8.0*  HCT 24.9* 27.2*  PLT 421* 445*   BMET  Recent Labs  10/14/13 0546 10/15/13 0350  NA 135* 137  K 3.6* 4.9  CL 95* 100  CO2 27 23  GLUCOSE 187* 217*  BUN 20 24*  CREATININE 1.64* 1.47*  CALCIUM 8.9 8.6   PT/INR  Recent Labs  10/12/13 1457  LABPROT 14.4  INR 1.14   CMP     Component Value Date/Time   NA 137 10/15/2013 0350   K 4.9 10/15/2013 0350   CL 100 10/15/2013 0350   CO2 23 10/15/2013 0350   GLUCOSE 217* 10/15/2013 0350   BUN 24* 10/15/2013 0350   CREATININE 1.47* 10/15/2013 0350   CALCIUM 8.6 10/15/2013 0350   PROT 5.9* 10/14/2013 0546   ALBUMIN 2.6* 10/14/2013 0546   AST 16 10/14/2013 0546   ALT 12 10/14/2013 0546   ALKPHOS 88 10/14/2013 0546   BILITOT <0.2* 10/14/2013 0546   GFRNONAA 48* 10/15/2013 0350   GFRAA 55* 10/15/2013 0350   Lipase     Component Value Date/Time   LIPASE 42 10/12/2013 1425       Studies/Results: No  results found.  Anti-infectives: Anti-infectives   Start     Dose/Rate Route Frequency Ordered Stop   10/14/13 1600  cefoTEtan (CEFOTAN) 2 g in dextrose 5 % 50 mL IVPB     2 g 100 mL/hr over 30 Minutes Intravenous Every 12 hours 10/14/13 1402 10/14/13 1800   10/14/13 0600  [MAR Hold]  cefOXitin (MEFOXIN) 2 g in dextrose 5 % 50 mL IVPB     (On MAR Hold since 10/14/13 0947)   2 g 100 mL/hr over 30 Minutes Intravenous On call to O.R. 10/13/13 1219 10/14/13 1006       Assessment/Plan 1. Small bowel tumor, likely metastatic, POD #1 s/p Ex lap, LOA x 1hr, SBR with side to side anastamosis 2. SBO, likely malignant secondary to #1  3. ARI, likely due to dehydration - Cr. 1.47 4. H/o colon cancer x3  5. Recurrent prostate cancer  6. HTN  7. DM  8. Acute on chronic anemia likely due to blood loss/iron deficiency  9. PCM/TPN  Plan:  1. PICC/TNA  2. Cont NGT and NPO, await return of bowel function  3. CEA mildly elevated at 5.8  4. IVF, pain control, antiemetics 5. Patient will ultimately need a new oncologist if path shows malignancy  6. Pending surgical pathology. 7. Continue foley until POD #2 8. Start dressing changes 48-72 hours 9. Added scheduled IV tylenol, supplemented magnesium 2grams   LOS: 3 days    Coralie Keens 10/15/2013, 7:31 AM Pager: [phone]

## 2013-10-16 LAB — GLUCOSE, CAPILLARY
GLUCOSE-CAPILLARY: 204 mg/dL — AB (ref 70–99)
Glucose-Capillary: 197 mg/dL — ABNORMAL HIGH (ref 70–99)
Glucose-Capillary: 200 mg/dL — ABNORMAL HIGH (ref 70–99)
Glucose-Capillary: 205 mg/dL — ABNORMAL HIGH (ref 70–99)
Glucose-Capillary: 217 mg/dL — ABNORMAL HIGH (ref 70–99)
Glucose-Capillary: 241 mg/dL — ABNORMAL HIGH (ref 70–99)

## 2013-10-16 LAB — BASIC METABOLIC PANEL
BUN: 33 mg/dL — ABNORMAL HIGH (ref 6–23)
CALCIUM: 8.7 mg/dL (ref 8.4–10.5)
CO2: 23 mEq/L (ref 19–32)
Chloride: 99 mEq/L (ref 96–112)
Creatinine, Ser: 1.77 mg/dL — ABNORMAL HIGH (ref 0.50–1.35)
GFR calc non Af Amer: 38 mL/min — ABNORMAL LOW (ref >90)
GFR, EST AFRICAN AMERICAN: 44 mL/min — AB (ref >90)
Glucose, Bld: 216 mg/dL — ABNORMAL HIGH (ref 70–99)
Potassium: 4.3 mEq/L (ref 3.7–5.3)
Sodium: 134 mEq/L — ABNORMAL LOW (ref 137–147)

## 2013-10-16 LAB — MAGNESIUM: MAGNESIUM: 2.2 mg/dL (ref 1.5–2.5)

## 2013-10-16 LAB — CBC
HCT: 20.6 % — ABNORMAL LOW (ref 39.0–52.0)
Hemoglobin: 6.2 g/dL — CL (ref 13.0–17.0)
MCH: 21.5 pg — ABNORMAL LOW (ref 26.0–34.0)
MCHC: 30.1 g/dL (ref 30.0–36.0)
MCV: 71.5 fL — ABNORMAL LOW (ref 78.0–100.0)
PLATELETS: 347 10*3/uL (ref 150–400)
RBC: 2.88 MIL/uL — ABNORMAL LOW (ref 4.22–5.81)
RDW: 17.9 % — AB (ref 11.5–15.5)
WBC: 15.7 10*3/uL — ABNORMAL HIGH (ref 4.0–10.5)

## 2013-10-16 MED ORDER — M.V.I. ADULT IV INJ
INTRAVENOUS | Status: AC
  Administered 2013-10-16: 17:00:00 via INTRAVENOUS
  Filled 2013-10-16: qty 2400

## 2013-10-16 MED ORDER — FAT EMULSION 20 % IV EMUL
240.0000 mL | INTRAVENOUS | Status: AC
  Administered 2013-10-16: 240 mL via INTRAVENOUS
  Filled 2013-10-16: qty 250

## 2013-10-16 NOTE — Progress Notes (Addendum)
2 Days Post-Op  Subjective: Alert. cooperative. Positive. Oriented. Says he's doing well. Pain control good. No respiratory problems. No stool or flatus yet.  Afebrile. Vital signs stable.Good urine output. NG drainage 450 cc/24 hr.  Remains on TNA  Hgb 6.2  Objective: Vital signs in last 24 hours: Temp:  [97.3 F (36.3 C)-98.1 F (36.7 C)] 97.9 F (36.6 C) (02/21 0600) Pulse Rate:  [83-98] 83 (02/21 0600) Resp:  [15-18] 18 (02/21 0600) BP: (104-146)/(31-64) 122/51 mmHg (02/21 0600) SpO2:  [97 %-100 %] 100 % (02/21 0600) FiO2 (%):  [40 %] 40 % (02/21 0355) Last BM Date: 10/12/13  Intake/Output from previous day: 02/20 0701 - 02/21 0700 In: 1406.7 [I.V.:1141.7; IV Piggyback:250; TPN:15] Out: 1425 [Urine:975; Emesis/NG output:450] Intake/Output this shift: Total I/O In: 615 [I.V.:600; TPN:15] Out: 925 [Urine:675; Emesis/NG output:250]  General appearance: alert. Cooperative. Mental status normal. No distress. Resp: clear to auscultation bilaterally GI: soft. Silent. Appropriately tender. Midline wound clean. Wicks in place.  Lab Results:  Results for orders placed during the hospital encounter of 10/12/13 (from the past 24 hour(s))  GLUCOSE, CAPILLARY     Status: Abnormal   Collection Time    10/15/13  7:53 AM      Result Value Ref Range   Glucose-Capillary 176 (*) 70 - 99 mg/dL  GLUCOSE, CAPILLARY     Status: Abnormal   Collection Time    10/15/13 12:16 PM      Result Value Ref Range   Glucose-Capillary 184 (*) 70 - 99 mg/dL  GLUCOSE, CAPILLARY     Status: Abnormal   Collection Time    10/15/13  4:24 PM      Result Value Ref Range   Glucose-Capillary 149 (*) 70 - 99 mg/dL  GLUCOSE, CAPILLARY     Status: Abnormal   Collection Time    10/15/13  8:12 PM      Result Value Ref Range   Glucose-Capillary 226 (*) 70 - 99 mg/dL   Comment 1 Notify RN    GLUCOSE, CAPILLARY     Status: Abnormal   Collection Time    10/16/13 12:21 AM      Result Value Ref Range    Glucose-Capillary 200 (*) 70 - 99 mg/dL   Comment 1 Notify RN    GLUCOSE, CAPILLARY     Status: Abnormal   Collection Time    10/16/13  4:36 AM      Result Value Ref Range   Glucose-Capillary 205 (*) 70 - 99 mg/dL   Comment 1 Notify RN       Studies/Results: @RISRSLT24 @  . antiseptic oral rinse  15 mL Mouth Rinse q12n4p  . chlorhexidine  15 mL Mouth Rinse BID  . enoxaparin (LOVENOX) injection  40 mg Subcutaneous Q24H  . HYDROmorphone PCA 0.3 mg/mL   Intravenous 6 times per day  . insulin aspart  0-9 Units Subcutaneous 6 times per day  . levothyroxine  25 mcg Intravenous Daily  . pantoprazole (PROTONIX) IV  40 mg Intravenous QHS     Assessment/Plan: s/p Procedure(s): EXPLORATORY LAPAROTOMY SMALL BOWEL RESECTION LYSIS OF ADHESION  POD #2. Lysis of adhesions and small bowel resection for small bowel mass. Stable. Expected ileus, continue NG tube Discontinue Foley Await pathology Ambulate more Labs tomorrow   DVT prophylaxis. A Lovenox.  History of colon cancer, 3 separate episodes in 3 separate resections, now with ileorectal anastomosis.  Recurrent prostate cancer, status post recent TUR  Hypertension Diabetes Acute on chronic anemia likely due to  blood loss and iron deficiency. We'll transfuse one unit PRBC 4 hemoglobin 6.2 and check CBC tomorrow. I discussed this with the patient and his wife. They are in agreement.  Protein calorie malnutrition - TNA  @PROBHOSP @  LOS: 4 days    Essence Merle M 10/16/2013  . .prob

## 2013-10-16 NOTE — Progress Notes (Signed)
PARENTERAL NUTRITION CONSULT NOTE  Pharmacy Consult for TPN  Indication: bowel obstruction  No Known Allergies  Patient Measurements: Height: 6' (182.9 cm) Weight: 210 lb (95.255 kg) IBW/kg (Calculated) : 77.6   Vital Signs: Temp: 97.9 F (36.6 C) (02/21 0600) Temp src: Oral (02/21 0600) BP: 122/51 mmHg (02/21 0600) Pulse Rate: 83 (02/21 0600) Intake/Output from previous day: 02/20 0701 - 02/21 0700 In: 2694.7 [I.V.:1741.7; IV Piggyback:250; TPN:703] Out: 1625 [Urine:1175; Emesis/NG output:450] Intake/Output from this shift:    Labs:  Recent Labs  10/14/13 0546 10/15/13 0350 10/16/13 0540  WBC 12.1* 19.6* 15.7*  HGB 7.5* 8.0* 6.2*  HCT 24.9* 27.2* 20.6*  PLT 421* 445* 347     Recent Labs  10/14/13 0546 10/15/13 0350 10/16/13 0540  NA 135* 137 134*  K 3.6* 4.9 4.3  CL 95* 100 99  CO2 27 23 23   GLUCOSE 187* 217* 216*  BUN 20 24* 33*  CREATININE 1.64* 1.47* PENDING  CALCIUM 8.9 8.6 8.7  MG 2.0 1.8 2.2  PHOS 3.6 3.5  --   PROT 5.9*  --   --   ALBUMIN 2.6*  --   --   AST 16  --   --   ALT 12  --   --   ALKPHOS 88  --   --   BILITOT <0.2*  --   --   PREALBUMIN 12.4*  --   --   TRIG 173*  --   --    CrCl cannot be calculated (Patient has no sCr result on file.).    Recent Labs  10/15/13 2012 10/16/13 0021 10/16/13 0436  GLUCAP 226* 200* 205*    GI: Admitted with 3 day h/o N&V.  He had recent procedure 2/6 for prostate cancer.  He also has a h/o colon cancer.  CT reveals 2 intra-adbominal masses likely tumor one of which is causing bowel obstruction. Also has retained endoscopic capsule.  S/p  small bowel resection 2/19, NPO with NG tube  450cc drainage/24 hours  Endo: h/o DM  On glipizide pta, cbgs 149-226,    13 units insulin ssi in last 24 hours  30 units of insulin in TNA Lytes: Na 134 K 4.3, mag 2.2  Pre alb 12.4 Renal: SrCr pending  Hepatobil: alk phos 88. Alb 2.6  TG 173   Best Practices: IV ppi, scds TPN Access:  PICC line placed  2/18 TPN day#:4  Current Nutrition:  NPO, clinimix E 5/15 at 80 ml/hr plus lipids at 10 ml/hr   Nutritional Goals: per RD 10/13/13 2380 -2560 kcals and 125 - 140 gm protein  Plan:  1. Increase clinimix E 5/15 to 100 ml/hr plus lipids at 10 ml/hr 2. Goal rate will be 110 ml/hr to provide 132 gm protein and 2354 kcals = 100% support. 3. Trace elements on MWF only due to shortage 4.  Increase insulin to 36 units 2 liter bag and  f/u cbgs  Eulalie Speights, Pharm.D. [phone] 10/16/2013 9:32 AM

## 2013-10-17 DIAGNOSIS — E119 Type 2 diabetes mellitus without complications: Secondary | ICD-10-CM

## 2013-10-17 LAB — GLUCOSE, CAPILLARY
GLUCOSE-CAPILLARY: 182 mg/dL — AB (ref 70–99)
GLUCOSE-CAPILLARY: 200 mg/dL — AB (ref 70–99)
GLUCOSE-CAPILLARY: 188 mg/dL — AB (ref 70–99)
Glucose-Capillary: 176 mg/dL — ABNORMAL HIGH (ref 70–99)
Glucose-Capillary: 189 mg/dL — ABNORMAL HIGH (ref 70–99)
Glucose-Capillary: 193 mg/dL — ABNORMAL HIGH (ref 70–99)

## 2013-10-17 LAB — BASIC METABOLIC PANEL
BUN: 26 mg/dL — ABNORMAL HIGH (ref 6–23)
CHLORIDE: 101 meq/L (ref 96–112)
CO2: 23 meq/L (ref 19–32)
CREATININE: 1.15 mg/dL (ref 0.50–1.35)
Calcium: 8.8 mg/dL (ref 8.4–10.5)
GFR calc Af Amer: 74 mL/min — ABNORMAL LOW (ref >90)
GFR calc non Af Amer: 64 mL/min — ABNORMAL LOW (ref >90)
GLUCOSE: 188 mg/dL — AB (ref 70–99)
Potassium: 3.9 mEq/L (ref 3.7–5.3)
SODIUM: 133 meq/L — AB (ref 137–147)

## 2013-10-17 LAB — TYPE AND SCREEN
ABO/RH(D): O POS
Antibody Screen: NEGATIVE
UNIT DIVISION: 0
Unit division: 0

## 2013-10-17 LAB — CBC
HCT: 22.8 % — ABNORMAL LOW (ref 39.0–52.0)
Hemoglobin: 7.2 g/dL — ABNORMAL LOW (ref 13.0–17.0)
MCH: 22.9 pg — ABNORMAL LOW (ref 26.0–34.0)
MCHC: 31.6 g/dL (ref 30.0–36.0)
MCV: 72.4 fL — AB (ref 78.0–100.0)
PLATELETS: 350 10*3/uL (ref 150–400)
RBC: 3.15 MIL/uL — AB (ref 4.22–5.81)
RDW: 19.5 % — ABNORMAL HIGH (ref 11.5–15.5)
WBC: 12.7 10*3/uL — AB (ref 4.0–10.5)

## 2013-10-17 MED ORDER — M.V.I. ADULT IV INJ
INTRAVENOUS | Status: AC
  Administered 2013-10-17: 17:00:00 via INTRAVENOUS
  Filled 2013-10-17: qty 2640

## 2013-10-17 MED ORDER — BISACODYL 10 MG RE SUPP
10.0000 mg | Freq: Once | RECTAL | Status: AC
  Administered 2013-10-17: 10 mg via RECTAL
  Filled 2013-10-17: qty 1

## 2013-10-17 MED ORDER — FAT EMULSION 20 % IV EMUL
240.0000 mL | INTRAVENOUS | Status: AC
  Administered 2013-10-17: 240 mL via INTRAVENOUS
  Filled 2013-10-17: qty 250

## 2013-10-17 MED ORDER — WHITE PETROLATUM GEL
Status: AC
  Administered 2013-10-17: 13:00:00
  Filled 2013-10-17: qty 5

## 2013-10-17 MED ORDER — INSULIN GLARGINE 100 UNIT/ML ~~LOC~~ SOLN
10.0000 [IU] | Freq: Every day | SUBCUTANEOUS | Status: AC
  Administered 2013-10-17 – 2013-10-21 (×5): 10 [IU] via SUBCUTANEOUS
  Filled 2013-10-17 (×6): qty 0.1

## 2013-10-17 NOTE — Progress Notes (Signed)
3 Days Post-Op  Subjective: Stable and alert. Ambulating in the hall. Pain control good. No respiratory problems. No stool or flatus yet.Foley out and voiding well.  Angie date is down to 350 cc per 24 hours. Remains on TNA.  Transfused one unit PRBC yesterday.Appropriate hemoglobin rise from 6.2-7.2. Potassium 3.9. Creatinine 1.15, better, glucose 188.  Objective: Vital signs in last 24 hours: Temp:  [97.9 F (36.6 C)-99.6 F (37.6 C)] 98.3 F (36.8 C) (02/22 0447) Pulse Rate:  [86-104] 94 (02/22 0447) Resp:  [15-24] 15 (02/22 0452) BP: (125-149)/(51-69) 149/69 mmHg (02/22 0447) SpO2:  [98 %-100 %] 98 % (02/22 0452) Last BM Date: 10/12/13  Intake/Output from previous day: 02/21 0701 - 02/22 0700 In: 2993.5 [I.V.:1415; Blood:12.5; TPN:1566] Out: 1800 [Urine:1450; Emesis/NG output:350] Intake/Output this shift: Total I/O In: -  Out: 850 [Urine:650; Emesis/NG output:200]  General appearance: alert. Very pleasant. No distress. Mental status normal Resp: clear to auscultation bilaterally GI: abdomen is soft. Minimally tender. Essentially silent. Not tympanitic. Midline wound clean. Wicks in place.  Lab Results:  Results for orders placed during the hospital encounter of 10/12/13 (from the past 24 hour(s))  GLUCOSE, CAPILLARY     Status: Abnormal   Collection Time    10/16/13  7:53 AM      Result Value Ref Range   Glucose-Capillary 197 (*) 70 - 99 mg/dL   Comment 1 Notify RN    GLUCOSE, CAPILLARY     Status: Abnormal   Collection Time    10/16/13 12:28 PM      Result Value Ref Range   Glucose-Capillary 241 (*) 70 - 99 mg/dL   Comment 1 Notify RN    GLUCOSE, CAPILLARY     Status: Abnormal   Collection Time    10/16/13  5:34 PM      Result Value Ref Range   Glucose-Capillary 204 (*) 70 - 99 mg/dL   Comment 1 Notify RN    GLUCOSE, CAPILLARY     Status: Abnormal   Collection Time    10/16/13  8:19 PM      Result Value Ref Range   Glucose-Capillary 217 (*) 70 - 99  mg/dL   Comment 1 Notify RN    GLUCOSE, CAPILLARY     Status: Abnormal   Collection Time    10/17/13 12:46 AM      Result Value Ref Range   Glucose-Capillary 189 (*) 70 - 99 mg/dL   Comment 1 Notify RN    GLUCOSE, CAPILLARY     Status: Abnormal   Collection Time    10/17/13  4:45 AM      Result Value Ref Range   Glucose-Capillary 176 (*) 70 - 99 mg/dL   Comment 1 Notify RN    CBC     Status: Abnormal   Collection Time    10/17/13  5:02 AM      Result Value Ref Range   WBC 12.7 (*) 4.0 - 10.5 K/uL   RBC 3.15 (*) 4.22 - 5.81 MIL/uL   Hemoglobin 7.2 (*) 13.0 - 17.0 g/dL   HCT 22.8 (*) 39.0 - 52.0 %   MCV 72.4 (*) 78.0 - 100.0 fL   MCH 22.9 (*) 26.0 - 34.0 pg   MCHC 31.6  30.0 - 36.0 g/dL   RDW 19.5 (*) 11.5 - 15.5 %   Platelets 350  150 - 400 K/uL  BASIC METABOLIC PANEL     Status: Abnormal   Collection Time    10/17/13  5:02 AM      Result Value Ref Range   Sodium 133 (*) 137 - 147 mEq/L   Potassium 3.9  3.7 - 5.3 mEq/L   Chloride 101  96 - 112 mEq/L   CO2 23  19 - 32 mEq/L   Glucose, Bld 188 (*) 70 - 99 mg/dL   BUN 26 (*) 6 - 23 mg/dL   Creatinine, Ser 1.15  0.50 - 1.35 mg/dL   Calcium 8.8  8.4 - 10.5 mg/dL   GFR calc non Af Amer 64 (*) >90 mL/min   GFR calc Af Amer 74 (*) >90 mL/min     Studies/Results: @RISRSLT24 @  . antiseptic oral rinse  15 mL Mouth Rinse q12n4p  . bisacodyl  10 mg Rectal Once  . chlorhexidine  15 mL Mouth Rinse BID  . enoxaparin (LOVENOX) injection  40 mg Subcutaneous Q24H  . HYDROmorphone PCA 0.3 mg/mL   Intravenous 6 times per day  . insulin aspart  0-9 Units Subcutaneous 6 times per day  . levothyroxine  25 mcg Intravenous Daily  . pantoprazole (PROTONIX) IV  40 mg Intravenous QHS     Assessment/Plan: s/p Procedure(s): EXPLORATORY LAPAROTOMY SMALL BOWEL RESECTION LYSIS OF ADHESION  POD #3. Lysis of adhesions and small bowel resection for small bowel mass.  Stable.  Expected ileus, continue NG tube  Try dulcolax supp X 1 Await  pathology  Ambulate more   DVT prophylaxis. On Lovenox.   History of colon cancer, 3 separate episodes in 3 separate resections, now with ileorectal anastomosis.  Recurrent prostate cancer, status post recent TUR  Hypertension   Diabetes - Needs a little bit better control. Will give Lantus 10 units daily  To see if this helps. Continue SSI.  Acute on chronic anemia likely due to blood loss and iron deficiency.Appropriate hemoglobin rise following one unit transfusion. Protein calorie malnutrition - TNA   @PROBHOSP @  LOS: 5 days    Dean Owens M 10/17/2013  . .prob

## 2013-10-17 NOTE — Progress Notes (Signed)
PARENTERAL NUTRITION CONSULT NOTE  Pharmacy Consult for TPN  Indication: bowel obstruction  No Known Allergies  Patient Measurements: Height: 6' (182.9 cm) Weight: 210 lb (95.255 kg) IBW/kg (Calculated) : 77.6   Vital Signs: Temp: 98.3 F (36.8 C) (02/22 0447) Temp src: Oral (02/22 0447) BP: 149/69 mmHg (02/22 0447) Pulse Rate: 94 (02/22 0447) Intake/Output from previous day: 02/21 0701 - 02/22 0700 In: 2993.5 [I.V.:1415; Blood:12.5; TPN:1566] Out: 2500 [Urine:2150; Emesis/NG output:350] Intake/Output from this shift:    Labs:  Recent Labs  10/15/13 0350 10/16/13 0540 10/17/13 0502  WBC 19.6* 15.7* 12.7*  HGB 8.0* 6.2* 7.2*  HCT 27.2* 20.6* 22.8*  PLT 445* 347 350     Recent Labs  10/15/13 0350 10/16/13 0540 10/17/13 0502  NA 137 134* 133*  K 4.9 4.3 3.9  CL 100 99 101  CO2 23 23 23  GLUCOSE 217* 216* 188*  BUN 24* 33* 26*  CREATININE 1.47* 1.77* 1.15  CALCIUM 8.6 8.7 8.8  MG 1.8 2.2  --   PHOS 3.5  --   --    Estimated Creatinine Clearance: 74.7 ml/min (by C-G formula based on Cr of 1.15).    Recent Labs  10/17/13 0046 10/17/13 0445 10/17/13 0759  GLUCAP 189* 176* 182*    GI: Admitted with 3 day h/o N&V.  He had recent procedure 2/6 for prostate cancer.  He also has a h/o colon cancer.  CT reveals 2 intra-adbominal masses likely tumor one of which is causing bowel obstruction. Also has retained endoscopic capsule.  S/p  small bowel resection 2/19, NPO with NG tube  200cc drainage  Endo: h/o DM  On glipizide pta, cbgs 149-226,    13 units insulin ssi in last 24 hours  36 units of insulin in TNA and MD added lantus 10 units daily 2/22 Lytes: Na 133 K 3.9  Pre alb 12.4 Renal: SrCr 1.15 Hepatobil: alk phos 88. Alb 2.6  TG 173   Best Practices: IV ppi, scds TPN Access:  PICC line placed 2/18 TPN day#:5  Current Nutrition:  NPO, clinimix E 5/15 at 100 ml/hr plus lipids at 10 ml/hr   Nutritional Goals: per RD 10/13/13 2380 -2560 kcals and 125  - 140 gm protein  Plan:  1. Increase clinimix E 5/15 to 110 ml/hr plus lipids at 10 ml/hr 2. Goal rate will be 110 ml/hr to provide 132 gm protein and 2354 kcals = 100% support. 3. Trace elements on MWF only due to shortage 4.  F/u am TPN labs   , Pharm.D. [phone] 10/17/2013 8:24 AM  

## 2013-10-18 ENCOUNTER — Other Ambulatory Visit: Payer: Self-pay | Admitting: Oncology

## 2013-10-18 DIAGNOSIS — D649 Anemia, unspecified: Secondary | ICD-10-CM

## 2013-10-18 LAB — CBC
HCT: 21.6 % — ABNORMAL LOW (ref 39.0–52.0)
HCT: 27.3 % — ABNORMAL LOW (ref 39.0–52.0)
Hemoglobin: 6.7 g/dL — CL (ref 13.0–17.0)
Hemoglobin: 8.9 g/dL — ABNORMAL LOW (ref 13.0–17.0)
MCH: 22.3 pg — ABNORMAL LOW (ref 26.0–34.0)
MCH: 24.1 pg — ABNORMAL LOW (ref 26.0–34.0)
MCHC: 31 g/dL (ref 30.0–36.0)
MCHC: 32.6 g/dL (ref 30.0–36.0)
MCV: 72 fL — ABNORMAL LOW (ref 78.0–100.0)
MCV: 74 fL — AB (ref 78.0–100.0)
Platelets: 393 10*3/uL (ref 150–400)
Platelets: 404 10*3/uL — ABNORMAL HIGH (ref 150–400)
RBC: 3 MIL/uL — ABNORMAL LOW (ref 4.22–5.81)
RBC: 3.69 MIL/uL — ABNORMAL LOW (ref 4.22–5.81)
RDW: 19.3 % — ABNORMAL HIGH (ref 11.5–15.5)
RDW: 20 % — AB (ref 11.5–15.5)
WBC: 8.5 10*3/uL (ref 4.0–10.5)
WBC: 8.8 10*3/uL (ref 4.0–10.5)

## 2013-10-18 LAB — GLUCOSE, CAPILLARY
GLUCOSE-CAPILLARY: 166 mg/dL — AB (ref 70–99)
GLUCOSE-CAPILLARY: 191 mg/dL — AB (ref 70–99)
Glucose-Capillary: 190 mg/dL — ABNORMAL HIGH (ref 70–99)
Glucose-Capillary: 213 mg/dL — ABNORMAL HIGH (ref 70–99)
Glucose-Capillary: 186 mg/dL — ABNORMAL HIGH (ref 70–99)
Glucose-Capillary: 216 mg/dL — ABNORMAL HIGH (ref 70–99)

## 2013-10-18 LAB — COMPREHENSIVE METABOLIC PANEL
ALT: 7 U/L (ref 0–53)
AST: 10 U/L (ref 0–37)
Albumin: 1.9 g/dL — ABNORMAL LOW (ref 3.5–5.2)
Alkaline Phosphatase: 76 U/L (ref 39–117)
BUN: 22 mg/dL (ref 6–23)
CO2: 23 mEq/L (ref 19–32)
CREATININE: 0.88 mg/dL (ref 0.50–1.35)
Calcium: 9.1 mg/dL (ref 8.4–10.5)
Chloride: 104 mEq/L (ref 96–112)
GFR calc Af Amer: >90 mL/min (ref >90)
GFR calc non Af Amer: 87 mL/min — ABNORMAL LOW (ref >90)
Glucose, Bld: 203 mg/dL — ABNORMAL HIGH (ref 70–99)
POTASSIUM: 3.9 meq/L (ref 3.7–5.3)
Sodium: 137 mEq/L (ref 137–147)
Total Bilirubin: 0.3 mg/dL (ref 0.3–1.2)
Total Protein: 5.4 g/dL — ABNORMAL LOW (ref 6.0–8.3)

## 2013-10-18 LAB — DIFFERENTIAL
BASOS PCT: 0 % (ref 0–1)
Basophils Absolute: 0 10*3/uL (ref 0.0–0.1)
EOS ABS: 0.9 10*3/uL — AB (ref 0.0–0.7)
Eosinophils Relative: 11 % — ABNORMAL HIGH (ref 0–5)
LYMPHS PCT: 10 % — AB (ref 12–46)
Lymphs Abs: 0.9 10*3/uL (ref 0.7–4.0)
MONOS PCT: 10 % (ref 3–12)
Monocytes Absolute: 0.9 10*3/uL (ref 0.1–1.0)
NEUTROS ABS: 5.8 10*3/uL (ref 1.7–7.7)
Neutrophils Relative %: 69 % (ref 43–77)

## 2013-10-18 LAB — PHOSPHORUS: PHOSPHORUS: 3.7 mg/dL (ref 2.3–4.6)

## 2013-10-18 LAB — PREALBUMIN: Prealbumin: 5.2 mg/dL — ABNORMAL LOW (ref 17.0–34.0)

## 2013-10-18 LAB — MAGNESIUM: Magnesium: 1.6 mg/dL (ref 1.5–2.5)

## 2013-10-18 LAB — TRIGLYCERIDES: Triglycerides: 114 mg/dL (ref <150)

## 2013-10-18 LAB — PREPARE RBC (CROSSMATCH)

## 2013-10-18 MED ORDER — FAT EMULSION 20 % IV EMUL
250.0000 mL | INTRAVENOUS | Status: AC
  Administered 2013-10-18: 250 mL via INTRAVENOUS
  Filled 2013-10-18: qty 250

## 2013-10-18 MED ORDER — HYDROMORPHONE HCL PF 1 MG/ML IJ SOLN
0.5000 mg | INTRAMUSCULAR | Status: DC | PRN
  Administered 2013-10-18 – 2013-10-20 (×9): 1 mg via INTRAVENOUS
  Filled 2013-10-18: qty 1
  Filled 2013-10-18: qty 2
  Filled 2013-10-18 (×6): qty 1

## 2013-10-18 MED ORDER — ACETAMINOPHEN 10 MG/ML IV SOLN
1000.0000 mg | Freq: Four times a day (QID) | INTRAVENOUS | Status: AC
  Administered 2013-10-18 – 2013-10-19 (×2): 1000 mg via INTRAVENOUS
  Filled 2013-10-18 (×2): qty 100

## 2013-10-18 MED ORDER — METHOCARBAMOL 100 MG/ML IJ SOLN
1000.0000 mg | Freq: Three times a day (TID) | INTRAVENOUS | Status: DC | PRN
  Filled 2013-10-18 (×2): qty 10

## 2013-10-18 MED ORDER — MAGNESIUM SULFATE 40 MG/ML IJ SOLN
2.0000 g | Freq: Once | INTRAMUSCULAR | Status: AC
  Administered 2013-10-18: 2 g via INTRAVENOUS
  Filled 2013-10-18: qty 50

## 2013-10-18 MED ORDER — TRACE MINERALS CR-CU-F-FE-I-MN-MO-SE-ZN IV SOLN
INTRAVENOUS | Status: AC
  Administered 2013-10-18: 18:00:00 via INTRAVENOUS
  Filled 2013-10-18: qty 2640

## 2013-10-18 MED ORDER — INSULIN ASPART 100 UNIT/ML ~~LOC~~ SOLN
0.0000 [IU] | SUBCUTANEOUS | Status: DC
  Administered 2013-10-18: 3 [IU] via SUBCUTANEOUS
  Administered 2013-10-18: 5 [IU] via SUBCUTANEOUS
  Administered 2013-10-18: 3 [IU] via SUBCUTANEOUS
  Administered 2013-10-19 (×2): 5 [IU] via SUBCUTANEOUS
  Administered 2013-10-19: 8 [IU] via SUBCUTANEOUS
  Administered 2013-10-19: 5 [IU] via SUBCUTANEOUS
  Administered 2013-10-19: 3 [IU] via SUBCUTANEOUS
  Administered 2013-10-19 – 2013-10-20 (×3): 5 [IU] via SUBCUTANEOUS
  Administered 2013-10-20 (×2): 3 [IU] via SUBCUTANEOUS
  Administered 2013-10-20: 8 [IU] via SUBCUTANEOUS
  Administered 2013-10-21 (×2): 3 [IU] via SUBCUTANEOUS

## 2013-10-18 NOTE — Consult Note (Signed)
[place]  [phone]   HEMATOLOGY ONCOLOGY CONSULTATION   Dean Owens  DOB: 12-14-1945  MR#: [id number]  CSN#: [id number]    Requesting Physician: [facility]  Teaching Service   Dr.   PCP: Horton Finer, MD SU: Greer Pickerel OTHER MD: Carolan Clines, Earle Gell   History of present illness: Colon Cancer    CC:    68 y.o. man with a history of 2 separate colon cancers, status post segmental colectomy followed by later completion colectomy, followed by adjuvant chemotherapy. Old records from [facility] are being requested (they might be on microfilm). We are being consulted to review history and determine further management  HPI:  Per patient report, he was diagnosed with colon cancer around 38 in [place], by colonoscopy. He first underwent a right colectomy with side-to-side ileotransverse colonic anastomosis. A few months later, he was found to have another  colon cancer and underwent sigmoid resection with end-to-end anastomosis (March 1997). After having 10 months of chemotherapy (possibly under Dr Silvestre Moment, a foprmar partner of ours) a followup colonoscopy found to additional evidence of colon cancer and he underwent a total intraabdominal colectomy with ileorectal anastomosis in May 1999.   In addition, he has a history of prostate cancer status post seed implant in 2007,  followed with Dr. Gaynelle Arabian, with latest  procedure being a cryo-ablation prostate on 10/01/2014 due to recurrent adenocarcinoma (Gleason 4+4)  In November 2014 the patient underwent evaluation for iron deficiency anemia with normal EGD with proximal small bowel biopsies and flexible proctosigmoidoscopy under Earle Gell. Capsule endoscopy 07/05/2013 showed "many small bowel lesions characterized by ulcerated-appearing mucosa and prominent appearing July. An area of bleeding was identified with one lesion." Surveillance colonoscopy with intubation of the  ileosigmoid anastomosis to biopsy one of the small bowel lesions was recommended. Note that the capsule never . study.  Currently the patient presented to the emergency department on 10/12/2013 with a 3 day history of nausea and vomiting, which prompted a CT scan of the abdomen and pelvis. This showed a  7.2 x 5.9 x 7.5 cm large soft tissue mass involving a small bowel loop in the left abdomen resulting in an apple core type obstruction of of the small bowel.There was also a 10.3 x 7.5 x 15.1 cm mixed density mass involving the right ileo psoas muscle c/w infection, hemorrhage or tumor. His CEA was mildly elevated at 5.8.  He underwent exploratory laparotomy on 10/14/13. There was a large bulky mass in the left abdomen incorporating some of the small bowel. It involved the mid jejunum. There was ample distal small bowel. Accordingly the tumor was removed en bloc (incidentally it included the endoscopy pill) and the small bowel was reconnected with side-to-side anastomosis.. Path report is pending.    Past medical history:      Past Medical History  Diagnosis Date  . Hypertension   . Hyperlipidemia   . Type 2 diabetes mellitus   . Hypothyroidism   . History of colon cancer NO RECURRENCE    S/P COLECTOMY X3  LAST ONE 1998--  [place]  . Recurrent prostate carcinoma FOLLOWED BY DR Gaynelle Arabian    DX 2007  S/P RADIOACTIVE SEED IMPLANTS  . ED (erectile dysfunction) of organic origin   . Right knee DJD   . Eczema   . OA (osteoarthritis)     LEFT KNEE  . Wears glasses   . History of iron deficiency anemia   . History of squamous  cell carcinoma excision     Past surgical history:      Past Surgical History  Procedure Laterality Date  . Givens capsule study N/A 07/05/2013    Procedure: GIVENS CAPSULE STUDY;  Surgeon: Charolett Bumpers, MD;  Location[facility];  Service: Endoscopy;  Laterality: N/A;  . Radioactive prostate seed implants  OCT 2007  . Knee arthroscopy Bilateral  2006 &  2007  . Tonsillectomy  AS CHILD  . Appendectomy  AGE 72  . Cryoablation N/A 10/01/2013    Procedure: CRYO ABLATION PROSTATE;  Surgeon: Kathi Ludwig, MD;  Location: [facility];  Service: Urology;  Laterality: N/A;  . Prostate surgery    . Right colectomy  09/07/1995  . Sigmoidectomy  11/13/95  . Total colectomy  12/29/1997    ileorectal anastomosis  . Laparotomy N/A 10/14/2013    Procedure: EXPLORATORY LAPAROTOMY;  Surgeon: Atilano Ina, MD;  Location: [facility] OR;  Service: General;  Laterality: N/A;  . Bowel resection N/A 10/14/2013    Procedure: SMALL BOWEL RESECTION;  Surgeon: Atilano Ina, MD;  Location: [facility] OR;  Service: General;  Laterality: N/A;  . Lysis of adhesion N/A 10/14/2013    Procedure: LYSIS OF ADHESION;  Surgeon: Atilano Ina, MD;  Location: [facility] OR;  Service: General;  Laterality: N/A;    Medications:  Prior to Admission:  Prescriptions prior to admission  Medication Sig Dispense Refill  . amLODipine-benazepril (LOTREL) 5-10 MG per capsule Take 1 capsule by mouth every morning.      [name] glipiZIDE (GLUCOTROL) 5 MG tablet Take 5 mg by mouth daily before breakfast.      . levothyroxine (SYNTHROID, LEVOTHROID) 50 MCG tablet Take 50 mcg by mouth daily before breakfast.      . trimethoprim (TRIMPEX) 100 MG tablet Take 1 tablet (100 mg total) by mouth 1 day or 1 dose.  30 tablet  1    BMZ:TAEWYBRKVTXLE (DILAUDID) injection, methocarbamol (ROBAXIN) IV, morphine injection, sodium chloride  Allergies: No Known Allergies  Family history:  Mother and father died with CHF,, one brother died with lung cancer, one with colon cancer, and  one brother alive and well. His paternal grandmother and his paternal aunts also died with colon cancer. He has 2 cousins alive with colon cancer.                                      Social history:  The patient is married, he is retired, formerly a smoker,he quit smoking about 15 years ago, 1/2 ppd for 15 years. He reports that  he drinks alcohol. He reports that he does not use illicit drugs. He is originally from [place]. 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 3 children in good health   Review of systems:  See HPI for significant positives.  Constitutional:  Negative for weight loss unknown amount. Negative for fever,night sweats, or chills  Eyes: Negative for blurred vision and double vision.  Respiratory: Negative for cough or  hemoptysis Negative  for  shortness of breath.  Cardiovascular: Negative for chest pain. Negative for palpitations.  GI:  As per HPI. No nausea, vomiting, diarrhea, or constipation. ZV:[id number] for hematuria. No loss of urinary control. No Urinary retention.  Skin: Negative for itching. No rash. No petechia. No bruising.  Neurological: No headaches. No motor or sensory deficits.No confusion.     Physical exam:  Filed Vitals:   10/18/13 1335  BP: 139/62  Pulse: 67  Temp: 97.6 F (36.4 C)  Resp: 20     Body mass index is 28.47 kg/(m^2).   General: 68 y.o. male  in no acute distress A. and O. x3  well-developed and well-nourished. Becomes easily irritable when asked questions. HEENT: Normocephalic, atraumatic, PERRLA. Oral cavity without thrush or lesions. Neck supple. no thyromegaly, no cervical or supraclavicular adenopathy  Lungs clear bilaterally . No wheezing, rhonchi or rales. No axillary masses. Breasts: not examined. Cardiac regular rate and rhythm normal S1-S2, no murmur , rubs or gallops Abdomen soft nontender , bowel sounds x4. No HSM. No masses palpable.  GU/rectal: deferred. Extremities no clubbing, no  cyanosis or edema. No bruising or petechial rash Musculoskeletal: no spinal tenderness.  Neuro: Non Focal   Lab results:       CBC  Recent Labs Lab 10/12/13 1457  10/14/13 0546 10/15/13 0350 10/16/13 0540 10/17/13 0502 10/18/13 0527  WBC 16.4*  < > 12.1* 19.6* 15.7* 12.7* 8.5  HGB 9.0*  < > 7.5* 8.0* 6.2* 7.2* 6.7*  HCT 29.2*  < > 24.9*  27.2* 20.6* 22.8* 21.6*  PLT 535*  < > 421* 445* 347 350 393  MCV 69.4*  < > 70.5* 71.0* 71.5* 72.4* 72.0*  MCH 21.4*  < > 21.2* 20.9* 21.5* 22.9* 22.3*  MCHC 30.8  < > 30.1 29.4* 30.1 31.6 31.0  RDW 17.9*  < > 17.7* 17.7* 17.9* 19.5* 19.3*  LYMPHSABS 1.6  --  1.2  --   --   --  0.9  MONOABS 1.5*  --  1.3*  --   --   --  0.9  EOSABS 0.2  --  0.2  --   --   --  0.9*  BASOSABS 0.0  --  0.0  --   --   --  0.0  < > = values in this interval not displayed.  Anemia panel:  No results found for this basename: VITAMINB12, FOLATE, FERRITIN, TIBC, IRON, RETICCTPCT,  in the last 72 hours   Chemistries   Recent Labs Lab 10/13/13 0450  10/14/13 0546 10/15/13 0350 10/16/13 0540 10/17/13 0502 10/18/13 0527  NA 137  --  135* 137 134* 133* 137  K 3.5*  --  3.6* 4.9 4.3 3.9 3.9  CL 92*  --  95* 100 99 101 104  CO2 29  --  27 23 23 23 23   GLUCOSE 135*  --  187* 217* 216* 188* 203*  BUN 31*  --  20 24* 33* 26* 22  CREATININE 2.49*  < > 1.64* 1.47* 1.77* 1.15 0.88  CALCIUM 9.1  --  8.9 8.6 8.7 8.8 9.1  MG  --   --  2.0 1.8 2.2  --  1.6  < > = values in this interval not displayed.   Coagulation profile  Recent Labs Lab 10/12/13 1457  INR 1.14    Urine Studies No results found for this basename: UACOL, UAPR, USPG, UPH, UTP, UGL, UKET, UBIL, UHGB, UNIT, UROB, ULEU, UEPI, UWBC, URBC, UBAC, CAST, CRYS, UCOM, BILUA,  in the last 72 hours  Studies:      Ct Abdomen Pelvis Wo Contrast  10/12/2013   CLINICAL DATA:  Abdominal pain. Patient has not passed camera capsule. [name]  EXAM: CT ABDOMEN AND PELVIS WITHOUT CONTRAST  TECHNIQUE: Multidetector CT imaging of the abdomen and pelvis was performed following the standard protocol without intravenous contrast.  COMPARISON:  DG  ABD 2 VIEWS dated 10/11/2013  FINDINGS: Liver normal. Spleen normal. No focal pancreatic abnormality. Punctate calcifications no the pancreas no evidence of pancreatitis. No biliary distention. Gallbladder is nondistended. No  pericholecystic fluid collections.  Adrenals normal. No focal renal lesions identified. No hydronephrosis. No obstructing ureteral stone. The bladder is nondistended. Prostate seeds are noted.  Aortic atherosclerotic vascular disease present.  No aneurysm.  Surgical clips are noted throughout the abdomen and pelvis. Appendix not visualized. Surgical sutures are in the rectosigmoid. Patient has had a prior colectomy. There is a 7.2 x 5.9 x 7.5 cm large soft tissue mass involving a small bowel loop in the lower left abdomen with resulting small bowel obstruction, best seen on image number 61/series 2. Differential diagnosis would include primary and metastatic small bowel neoplasm . Endoscopic camera is noted just above the a small bowel obstruction. Proximal small bowel obstruction is again present. The small bowel is distended up to 4.4 cm in diameter. No free air is identified.  Present in the right lower quadrant is a 10.3 x 7.5 x 15.1 cm mixed density mass with involvement of the right ileo psoas muscle. Adjacent fat planes stranding along the undersurface of the third portion of the duodenum is present. Differential diagnosis includes infection, old hemorrhage, and tumor. Primary and metastatic malignancy could present in this fashion. Retroperitoneal sarcoma and lymphoma could present in this fashion. PET-CT can be obtained for further evaluation.  Heart size is normal. Coronary artery disease. Passive atelectasis lung bases. Degenerative changes lumbar spine. Tiny umbilical hernia and left inguinal hernia with herniation of fat only.  IMPRESSION: 1. 7.2 x 5.9 x 7.5 cm large soft tissue mass involving a small bowel loop in the left abdomen resulting in an apple core type obstruction of of the small bowel. Differential diagnosis would include primary and metastatic static small bowel malignancy. Endoscopic camera is noted just above the small bowel obstruction. Patient has had a prior colectomy.  2. 10.3 x 7.5 x  15.1 cm mixed density mass involving the right ileo psoas muscle. Differential diagnosis includes infection, old hemorrhage, and tumor. Primary and metastatic malignancy could present in this fashion. This would include retroperitoneal sarcoma and lymphoma. PET-CT would be useful for further evaluation of this patient.  3. Prostate seeds are noted. This is consistent with the patient's known history prostate cancer.  These results were called by telephone at the time of interpretation on 10/12/2013 at 5:18 PM to Dr. Reather Converse , who verbally acknowledged these results.   Electronically Signed   By: Marcello Moores  Register   On: 10/12/2013 17:17   Dg Abd 2 Views  10/11/2013   CLINICAL DATA:  Capsule endoscopy  EXAM: ABDOMEN - 2 VIEW  COMPARISON:  08/03/2013  FINDINGS: Upright film shows no evidence for intraperitoneal free air.  Supine film shows mild gaseous distention of small bowel without evidence for overt obstruction. Suture material is seen in the abdomen bilaterally.  Capsule with camera is identified in the left abdomen, in a similar location to the previous study.  IMPRESSION: Persistent retention of the capsule after capsule endoscopy. No overt bowel obstruction at this time and the capsule is in a similar location to the previous study, likely in the distal descending to proximal sigmoid colon.   Electronically Signed   By: Misty Stanley M.D.   On: 10/11/2013 10:50    Assessmnent:  68 y.o. [place] man with a history of colon cancer, as follows:  (1) s/p right colectomy with side-to-side ileotransverse  colonic anastomosis (1996?) followed by sigmoid resection with end-to-end anastomosis (March 1997) for a separate mass, followed by 10 months of adjuvant chemotherapy (likely fluorouracil alone); with a followup colonoscopy showing additional evidence of colon cancer he underwent a total intraabdominal colectomy with ileorectal anastomosis in May 1999.  (2) status post capsule endoscopy November 2014  showing multiple ulcerated small bowel lesions  (3) now s/p exploratory laparotomy with small bowel resection and side-to-side small bowel reanastomosis 10/14/2013 for a large intra-abdominal mass, final pathology pending  (4) also history of prostate cancer, status post seed implantation therapy October of 2007, with a slowly rising PSA over the past year, recent prostate biopsy showing left sided prostate recurrence with a Gleason 4+4 cancer, with no evidence of extraprostatic extension, status post cryotherapy 10/01/2013  Plan: I am waiting on the final pathology, but clearly the patient's history as well as his family history suggests a likely familial syndrome and Mr. Fudala warrants genetic testing. Given that he underwent a total colectomy more than 15 years ago, this is not likely to be recurrent colon cancer. According to his capsule endoscopy in November, multiple small bowel lesions were seen (however I do not have data on followup between November and this admission). At this point it is also not clear what the psoas mass noted on CT scan represents. We do not see evidence of liver involvement at this point.  In short, a definitive plan I weights the final path report. I plan to meet with the patient 10/20/2013, by which time we should have the pathologic information and I will have obtained further information from Dr. Wynetta Emery. I will arrange for genetic testing as outpatient. While radiologist suggests a PET CT scan for evaluation of the psoas mass, this is generally obtained on an outpatient setting. I will last radiology whether they feel an MRI of the psoas muscle would be informative.  Will follow with you. Appreciate being consulted on this case.

## 2013-10-18 NOTE — Progress Notes (Signed)
4 Days Post-Op  Subjective: Pt feels a bit better, pain better controlled, ambulating through the halls.  NG tube irritating.  Wants to get home ASAP.  Noticeably frustrated.  Wants to get rid of PCA.  No N/V.  +flatus, small BM yesterday after dulcolax.  Objective: Vital signs in last 24 hours: Temp:  [97.9 F (36.6 C)-98.9 F (37.2 C)] 97.9 F (36.6 C) (02/23 0654) Pulse Rate:  [79-87] 79 (02/23 0654) Resp:  [15-22] 18 (02/23 0654) BP: (142-159)/(62-68) 159/63 mmHg (02/23 0654) SpO2:  [96 %-100 %] 100 % (02/23 0654) FiO2 (%):  [38 %-40 %] 40 % (02/23 0051) Last BM Date: 10/17/13  Intake/Output from previous day: 02/22 0701 - 02/23 0700 In: -  Out: 150 [Emesis/NG output:150] Intake/Output this shift:    PE: Gen:  Alert, NAD, pleasant Card:  RRR, no M/G/R heard Pulm:  CTA, no W/R/R Abd: Soft, mild tenderness, mild distension, +BS, no HSM, incisions C/D/I with staples in place, NG tube put out 315mL today   Lab Results:   Recent Labs  10/17/13 0502 10/18/13 0527  WBC 12.7* 8.5  HGB 7.2* 6.7*  HCT 22.8* 21.6*  PLT 350 393   BMET  Recent Labs  10/17/13 0502 10/18/13 0527  NA 133* 137  K 3.9 3.9  CL 101 104  CO2 23 23  GLUCOSE 188* 203*  BUN 26* 22  CREATININE 1.15 0.88  CALCIUM 8.8 9.1   PT/INR No results found for this basename: LABPROT, INR,  in the last 72 hours CMP     Component Value Date/Time   NA 137 10/18/2013 0527   K 3.9 10/18/2013 0527   CL 104 10/18/2013 0527   CO2 23 10/18/2013 0527   GLUCOSE 203* 10/18/2013 0527   BUN 22 10/18/2013 0527   CREATININE 0.88 10/18/2013 0527   CALCIUM 9.1 10/18/2013 0527   PROT 5.4* 10/18/2013 0527   ALBUMIN 1.9* 10/18/2013 0527   AST 10 10/18/2013 0527   ALT 7 10/18/2013 0527   ALKPHOS 76 10/18/2013 0527   BILITOT 0.3 10/18/2013 0527   GFRNONAA 87* 10/18/2013 0527   GFRAA >90 10/18/2013 0527   Lipase     Component Value Date/Time   LIPASE 42 10/12/2013 1425       Studies/Results: No results  found.  Anti-infectives: Anti-infectives   Start     Dose/Rate Route Frequency Ordered Stop   10/14/13 1600  cefoTEtan (CEFOTAN) 2 g in dextrose 5 % 50 mL IVPB     2 g 100 mL/hr over 30 Minutes Intravenous Every 12 hours 10/14/13 1402 10/14/13 1800   10/14/13 0600  [MAR Hold]  cefOXitin (MEFOXIN) 2 g in dextrose 5 % 50 mL IVPB     (On MAR Hold since 10/14/13 0947)   2 g 100 mL/hr over 30 Minutes Intravenous On call to O.R. 10/13/13 1219 10/14/13 1006       Assessment/Plan 1. Small bowel tumor, likely metastatic, POD #4 s/p Ex lap, LOA x 1hr, SBR with side to side anastamosis  2. SBO, likely malignant secondary to #1  3. ARI, likely due to dehydration - Cr. 0.88 now 4. H/o colon cancer x3  5. Recurrent prostate cancer - s/p cryo ablation on 09/30/13 6. HTN  7. DM  8. Acute on chronic anemia likely due to blood loss/iron deficiency s/p 1 unit pRBC's on 10/16/13, 2 units pRBC's on 10/18/13 9. PCM/TPN    Plan:  1. PICC/TNA  2. Clamp NG tube for trials, await return of bowel  function  3. CEA mildly elevated at 5.8  4. IVF, antiemetics, D/c PCA and start PRN IV meds 5. Pending surgical pathology 6. Lantus 10 units QD, BS 203, will ask pharmacy to adjust sugars in TPN, will d/c D5W IVF 7. Start dressing changes 8. Added scheduled IV tylenol, robaxin 9. Hgb is 6.7 today down from 7.2, Mg supplemented on 10/15/13, will administer 2 more units of blood, iron supplement when taking PO, holding lovenox, will need to restart ASAP 10. Called heme/onc to help with Acute on chronic anemia and he needs a new oncologist for colon cancer diagnosis- Dr. Virgie Dad office called     LOS: 6 days    [place], [facility] 10/18/2013, 7:51 AM Pager: [phone]

## 2013-10-18 NOTE — Progress Notes (Signed)
Some flatus. Pain ok. Small bm after dulcolax. Feels like he is about to pass a lot of flatus  nad Some distension, incision c/d/i; appropriate TTP  Ng clamp today; would leave in today and go slow hgb dropped again - transfuse; stop lovenox for now given ongoing need for transfusion Will need to strictly wear scds and ambulate multiple times per day  Leighton Ruff. Redmond Pulling, MD, FACS General, Bariatric, & Minimally Invasive Surgery [facility], [place]

## 2013-10-18 NOTE — Progress Notes (Signed)
NUTRITION FOLLOW UP  Intervention:   TPN per PharmD Diet advancement per MD RD to continue to monitor; will provide supplements as needed  Nutrition Dx:   Inadequate oral intake related to SBO as evidenced by 9% weight loss in one month per pt report.   Goal:   Pt to meet >/= 90% of their estimated nutrition needs; being met  Monitor:   TPN initiation, goals of care, weight trends, labs  Assessment:   68 year old male with previous history of segmental colectomy followed later by a completion colectomy for separate colon cancers. He underwent adjuvant chemotherapy. Additionally, he is undergoing treatment for prostate cancer. He underwent a procedure on February 6 with Dr. Gaynelle Arabian. He presented to the emergency department with three-day history of nausea and vomiting. Workup included CT scan demonstrating 2 separate intra-abdominal masses consistent with tumor. One involves the small bowel causing a small bowel obstruction. He is passing some gas and currently feels like he needs to have a bowel movement.  2/17: Pt resting at time of visit; answered questions with brief responses and kept eyes closed during visit. Pt reports that he was at his usual body weight of 230 lbs about 4 weeks ago. He reports 20 lbs weight loss in the past 3-4 weeks due to SBO and inability to tolerate food. Per MD note, pt has had poor intake over the last several weeks and expected NPO status for at least a week. Will attempt nutrition-focused physical exam at later date.  Per MD note, continue NGT and NPO status. Will need operation this admission for palliative cause to relieve obstruction. If these are metastatic lesions, our surgery is not curable.  2/20 s/p Procedure(s):  EXPLORATORY LAPAROTOMY  SMALL BOWEL RESECTION  LYSIS OF ADHESION  2/23: TPN was initiated  2/18. Pt continues to receive Clinimix E 5/15 at 110 ml/hr + IVFE at 10 ml/hr. TPN provides 2354 kCal ad 132gm protein daily; meeting 99% of  minimum estimated energy needs and 106% of minimum estimated protein needs. Per chart, pt had small BM. Plan to clamp NG tube today, await return of bowel function. Awaiting pathology. No new weight since admission.   Labs: Low hemoglobin, Elevated glucose. Potassium, magnesium, and phosphorus are WNL. Triglycerides are WNL.    Height: Ht Readings from Last 1 Encounters:  10/12/13 6' (1.829 m)    Weight Status:   Wt Readings from Last 1 Encounters:  10/12/13 210 lb (95.255 kg)    Re-estimated needs:  Kcal: 2380-2560  Protein: 125-140 grams  Fluid: 2.4-2.6 L/day   Skin: abdominal incision, perineum incision  Diet Order: NPO   Intake/Output Summary (Last 24 hours) at 10/18/13 1446 Last data filed at 10/18/13 1340  Gross per 24 hour  Intake   1470 ml  Output    650 ml  Net    820 ml    Last BM: 2/22   Labs:   Recent Labs Lab 10/14/13 0546 10/15/13 0350 10/16/13 0540 10/17/13 0502 10/18/13 0527  NA 135* 137 134* 133* 137  K 3.6* 4.9 4.3 3.9 3.9  CL 95* 100 99 101 104  CO2 _0 BUN 20 24* 33* 26* 22  CREATININE 1.64* 1.47* 1.77* 1.15 0.88  CALCIUM 8.9 8.6 8.7 8.8 9.1  MG 2.0 1.8 2.2  --  1.6  PHOS 3.6 3.5  --   --  3.7  GLUCOSE 187* 217* 216* 188* 203*    CBG (last 3)   Recent Labs  10/18/13 0417 10/18/13 0816 10/18/13 1240  GLUCAP 190* 213* 186*    Scheduled Meds: . acetaminophen  1,000 mg Intravenous 4 times per day  . antiseptic oral rinse  15 mL Mouth Rinse q12n4p  . chlorhexidine  15 mL Mouth Rinse BID  . insulin aspart  0-15 Units Subcutaneous 6 times per day  . insulin glargine  10 Units Subcutaneous Daily  . levothyroxine  25 mcg Intravenous Daily  . magnesium sulfate 1 - 4 g bolus IVPB  2 g Intravenous Once  . pantoprazole (PROTONIX) IV  40 mg Intravenous QHS    Continuous Infusions: . Marland KitchenTPN (CLINIMIX-E) Adult 110 mL/hr at 10/17/13 1726   And  . fat emulsion 240 mL (10/17/13 1726)  . Marland KitchenTPN (CLINIMIX-E) Adult     And  .  fat emulsion      Pryor Ochoa RD, LDN Inpatient Clinical Dietitian Pager: [phone] After Hours Pager: [phone]

## 2013-10-18 NOTE — Progress Notes (Signed)
1840 

## 2013-10-18 NOTE — Progress Notes (Signed)
PARENTERAL NUTRITION CONSULT NOTE - FOLLOW UP  Pharmacy Consult:  TPN  Indication:  Bowel obstruction  No Known Allergies  Patient Measurements: Height: 6' (182.9 cm) Weight: 210 lb (95.255 kg) IBW/kg (Calculated) : 77.6  Vital Signs: Temp: 97.9 F (36.6 C) (02/23 0654) Temp src: Oral (02/23 0654) BP: 159/63 mmHg (02/23 0654) Pulse Rate: 79 (02/23 0654) Intake/Output from previous day: 02/22 0701 - 02/23 0700 In: 1220 [TPN:1220] Out: 500 [Emesis/NG output:500]  Labs:  Recent Labs  10/16/13 0540 10/17/13 0502 10/18/13 0527  WBC 15.7* 12.7* 8.5  HGB 6.2* 7.2* 6.7*  HCT 20.6* 22.8* 21.6*  PLT 347 350 393     Recent Labs  10/16/13 0540 10/17/13 0502 10/18/13 0527  NA 134* 133* 137  K 4.3 3.9 3.9  CL 99 101 104  CO2 23 23 23   GLUCOSE 216* 188* 203*  BUN 33* 26* 22  CREATININE 1.77* 1.15 0.88  CALCIUM 8.7 8.8 9.1  MG 2.2  --  1.6  PHOS  --   --  3.7  PROT  --   --  5.4*  ALBUMIN  --   --  1.9*  AST  --   --  10  ALT  --   --  7  ALKPHOS  --   --  76  BILITOT  --   --  0.3  TRIG  --   --  114   Estimated Creatinine Clearance: 97.6 ml/min (by C-G formula based on Cr of 0.88).    Recent Labs  10/18/13 0016 10/18/13 0417 10/18/13 0816  GLUCAP 166* 190* 213*     Insulin requirement in the past 24 hours: 12 units sensitive SSI + Lantus 10 units (started 2/22) + 36 units regular insulin in TPN  Assessment: 59 YOM with history of prostate and colon cancer found to have two new intra-abdominal masses that are likely the cause of bowel obstruction.  He is s/p SBR on 10/14/13 and to continue on TPN for nutritional support.  Patient's bowel function is returning and NG to be clamped today per MD.   GI: two intra-abd masses likely causing bowel obstruction, s/p SBR 2/19.  NG O/P increased to 540mL,  PPI IV, +flatus and small BM >> clamping NG today Endo: hypothyroidism on IV Synthroid.  DM2, CBGs uncontrolled (166-213), D5W d/c'ed Lytes: Mag low normal,  CoCa mildly elevated at 10.78, others WNL Renal: SCr down 0.88, CrCL 98 ml/min Pulm: remains on 2L [place] Cards: HTN / HLD - BP high normal, HR normal, TG WNL Hepatobil: LFTs/tbili WNL Neuro: scheduled IV APAP Heme/Onc: prostate cancer s/p procedure 2/6, colon cancer s/p colectomy x3 ahd chemo in 1997 ID: afebrile, WBC WNL - not on abx Best Practices: SCDs, MC (Lovenox d/c'ed d/t drop in hgb) TPN Access: PICC placed 10/13/13 TPN day#: 5 (2/18 >> )  Current Nutrition:  TPN  Nutritional Goals: 2380 - 2560 kCal,  125 - 140 gm protein   Plan:  - Continue Clinimix E 5/15 at 110 ml/hr + IVFE at 10 ml/hr.  TPN provides 2354 kCal ad 132gm protein daily, meeting 100% of needs - Daily IV multivitamin in TPN - Trace elements on MWF only due to national shortage - Continue Lantus 10 units SQ daily.  Increase insulin in TPN to 45 units and change SSI to moderate scale. - Mag sulfate 2gm IV x 1 - F/U with ability to start PO diet soon    Cottrell Gentles D. Mina Marble, PharmD, [place] Pager:  [phone]  10/18/2013, 11:42 AM

## 2013-10-19 ENCOUNTER — Other Ambulatory Visit: Payer: Self-pay | Admitting: Oncology

## 2013-10-19 DIAGNOSIS — E119 Type 2 diabetes mellitus without complications: Secondary | ICD-10-CM

## 2013-10-19 DIAGNOSIS — Z85038 Personal history of other malignant neoplasm of large intestine: Secondary | ICD-10-CM

## 2013-10-19 DIAGNOSIS — R112 Nausea with vomiting, unspecified: Secondary | ICD-10-CM

## 2013-10-19 DIAGNOSIS — I10 Essential (primary) hypertension: Secondary | ICD-10-CM

## 2013-10-19 DIAGNOSIS — E039 Hypothyroidism, unspecified: Secondary | ICD-10-CM

## 2013-10-19 DIAGNOSIS — K56609 Unspecified intestinal obstruction, unspecified as to partial versus complete obstruction: Secondary | ICD-10-CM

## 2013-10-19 DIAGNOSIS — R1900 Intra-abdominal and pelvic swelling, mass and lump, unspecified site: Secondary | ICD-10-CM

## 2013-10-19 LAB — TYPE AND SCREEN
ABO/RH(D): O POS
ANTIBODY SCREEN: NEGATIVE
UNIT DIVISION: 0
Unit division: 0

## 2013-10-19 LAB — BASIC METABOLIC PANEL
BUN: 20 mg/dL (ref 6–23)
CHLORIDE: 103 meq/L (ref 96–112)
CO2: 21 mEq/L (ref 19–32)
Calcium: 9.3 mg/dL (ref 8.4–10.5)
Creatinine, Ser: 0.79 mg/dL (ref 0.50–1.35)
GFR calc non Af Amer: >90 mL/min (ref >90)
Glucose, Bld: 215 mg/dL — ABNORMAL HIGH (ref 70–99)
Potassium: 3.6 mEq/L — ABNORMAL LOW (ref 3.7–5.3)
Sodium: 138 mEq/L (ref 137–147)

## 2013-10-19 LAB — GLUCOSE, CAPILLARY
GLUCOSE-CAPILLARY: 245 mg/dL — AB (ref 70–99)
GLUCOSE-CAPILLARY: 260 mg/dL — AB (ref 70–99)
Glucose-Capillary: 216 mg/dL — ABNORMAL HIGH (ref 70–99)

## 2013-10-19 LAB — CBC
HCT: 26.6 % — ABNORMAL LOW (ref 39.0–52.0)
Hemoglobin: 8.8 g/dL — ABNORMAL LOW (ref 13.0–17.0)
MCH: 24.3 pg — ABNORMAL LOW (ref 26.0–34.0)
MCHC: 33.1 g/dL (ref 30.0–36.0)
MCV: 73.5 fL — AB (ref 78.0–100.0)
Platelets: 384 10*3/uL (ref 150–400)
RBC: 3.62 MIL/uL — ABNORMAL LOW (ref 4.22–5.81)
RDW: 19.8 % — ABNORMAL HIGH (ref 11.5–15.5)
WBC: 8.5 10*3/uL (ref 4.0–10.5)

## 2013-10-19 LAB — MAGNESIUM: Magnesium: 1.8 mg/dL (ref 1.5–2.5)

## 2013-10-19 MED ORDER — HEPARIN SODIUM (PORCINE) 5000 UNIT/ML IJ SOLN
5000.0000 [IU] | Freq: Three times a day (TID) | INTRAMUSCULAR | Status: DC
  Administered 2013-10-19 – 2013-10-21 (×8): 5000 [IU] via SUBCUTANEOUS
  Filled 2013-10-19 (×12): qty 1

## 2013-10-19 MED ORDER — POTASSIUM CHLORIDE 10 MEQ/50ML IV SOLN
10.0000 meq | INTRAVENOUS | Status: AC
  Administered 2013-10-19 (×2): 10 meq via INTRAVENOUS
  Filled 2013-10-19 (×2): qty 50

## 2013-10-19 MED ORDER — MAGNESIUM SULFATE 40 MG/ML IJ SOLN
2.0000 g | Freq: Once | INTRAMUSCULAR | Status: AC
  Administered 2013-10-19: 2 g via INTRAVENOUS
  Filled 2013-10-19: qty 50

## 2013-10-19 MED ORDER — M.V.I. ADULT IV INJ
INTRAVENOUS | Status: AC
  Administered 2013-10-19: 19:00:00 via INTRAVENOUS
  Filled 2013-10-19: qty 2640

## 2013-10-19 MED ORDER — FAT EMULSION 20 % IV EMUL
250.0000 mL | INTRAVENOUS | Status: AC
  Administered 2013-10-19: 250 mL via INTRAVENOUS
  Filled 2013-10-19: qty 250

## 2013-10-19 MED ORDER — BISACODYL 10 MG RE SUPP
10.0000 mg | Freq: Once | RECTAL | Status: AC
  Administered 2013-10-19: 10 mg via RECTAL
  Filled 2013-10-19: qty 1

## 2013-10-19 MED ORDER — AMLODIPINE BESYLATE 5 MG PO TABS
5.0000 mg | ORAL_TABLET | Freq: Every day | ORAL | Status: DC
  Administered 2013-10-19 – 2013-10-20 (×2): 5 mg via ORAL
  Filled 2013-10-19 (×3): qty 1

## 2013-10-19 MED ORDER — POTASSIUM CHLORIDE 10 MEQ/50ML IV SOLN
10.0000 meq | INTRAVENOUS | Status: AC
  Administered 2013-10-19 (×2): 10 meq via INTRAVENOUS
  Filled 2013-10-19 (×6): qty 50

## 2013-10-19 NOTE — Progress Notes (Signed)
No c/o nausea or increase pain. Gastric residual checked, 49ml greenish/bilious fluid.NGtube still clamped.

## 2013-10-19 NOTE — Progress Notes (Signed)
Abdomen soft, non-distended, active bowel sounds Mild LLQ tenderness, but this seems to be mild cramping.  Will give Dulcolax supp x 1 to stimulate a bowel movement Passing flatus  Imogene Burn. Georgette Dover, MD, [facility]  General/ Trauma Surgery  10/19/2013 3:36 PM

## 2013-10-19 NOTE — Progress Notes (Signed)
Dr. Nancee Liter office closed today due to inclement weather. Will attempt to obtain records 2/25.

## 2013-10-19 NOTE — Progress Notes (Signed)
PARENTERAL NUTRITION CONSULT NOTE - FOLLOW UP  Pharmacy Consult:  TPN  Indication:  Bowel obstruction  No Known Allergies  Patient Measurements: Height: 6' (182.9 cm) Weight: 210 lb (95.255 kg) IBW/kg (Calculated) : 77.6  Vital Signs: Temp: 98.6 F (37 C) (02/24 0643) Temp src: Oral (02/24 0643) BP: 160/63 mmHg (02/24 0643) Pulse Rate: 85 (02/24 0643) Intake/Output from previous day: 02/23 0701 - 02/24 0700 In: 2748 [I.V.:500; Blood:660; TPN:1588] Out: 1050 [Urine:1050]  Labs:  Recent Labs  10/18/13 0527 10/18/13 2240 10/19/13 0620  WBC 8.5 8.8 8.5  HGB 6.7* 8.9* 8.8*  HCT 21.6* 27.3* 26.6*  PLT 393 404* 384     Recent Labs  10/17/13 0502 10/18/13 0527 10/19/13 0620  NA 133* 137 138  K 3.9 3.9 3.6*  CL 101 104 103  CO2 23 23 21   GLUCOSE 188* 203* 215*  BUN 26* 22 20  CREATININE 1.15 0.88 0.79  CALCIUM 8.8 9.1 9.3  MG  --  1.6 1.8  PHOS  --  3.7  --   PROT  --  5.4*  --   ALBUMIN  --  1.9*  --   AST  --  10  --   ALT  --  7  --   ALKPHOS  --  76  --   BILITOT  --  0.3  --   PREALBUMIN  --  5.2*  --   TRIG  --  114  --    Estimated Creatinine Clearance: 107.3 ml/min (by C-G formula based on Cr of 0.79).    Recent Labs  10/18/13 2038 10/19/13 0008 10/19/13 0359  GLUCAP 216* 216* 245*     Insulin requirement in the past 24 hours: 18 units sensitive SSI + Lantus 10 units (started 2/22) + 45 units regular insulin in TPN  Assessment: 39 YOM with history of prostate and colon cancer found to have two new intra-abdominal masses that are likely the cause of bowel obstruction.  He is s/p SBR on 10/14/13 and to continue on TPN for nutritional support.  Patient's bowel function is returning, NG clamped and he is to start on a clear liquid diet.   GI: two intra-abd masses likely causing bowel obstruction, s/p SBR 2/19, plan for genetic testing as oupt, may obtain MRI of psoas mass.  NG clamped,  PPI IV, +flatus and small BM 2/23, prealbumin low at 5.2  likely d/t stress of illness, TG WNL Endo: hypothyroidism on IV Synthroid.  DM2, CBGs remain uncontrolled Lytes: Mag improved post supplementation, K+ 3.6 (4 runs ordered), CoCa mildly elevated at 10.98 Renal: SCr down 0.79, CrCL 107 ml/min, decent UOP Pulm: [place] >> RA Cards: HTN / HLD - BP elevated, HR normal Hepatobil: LFTs/tbili WNL Neuro: scheduled IV APAP Heme/Onc: prostate cancer s/p procedure 2/6, colon cancer s/p colectomy x3 ahd chemo in 1997 ID: afebrile, WBC WNL - not on abx Best Practices: heparin SQ, SCDs, MC TPN Access: PICC placed 10/13/13 TPN day#: 6 (2/18 >> )  Current Nutrition:  TPN + clear liquid diet  Nutritional Goals: 2380 - 2560 kCal,  125 - 140 gm protein   Plan:  - Continue Clinimix E 5/15 at 110 ml/hr + IVFE at 10 ml/hr.  TPN provides 2354 kCal ad 132gm protein daily, meeting 100% of needs - Daily IV multivitamin in TPN - Trace elements on MWF only due to national shortage - Continue Lantus 10 units SQ daily and moderate SSI.  Increase insulin in TPN to 60 units. -  Mag sulfate 2gm IV x 1, f/u labs on Thurs - F/U with PO intake to start weaning TPN    Laresha Bacorn D. Mina Marble, PharmD, BCPS Pager:  [phone] 10/19/2013, 10:21 AM

## 2013-10-19 NOTE — Progress Notes (Signed)
5 Days Post-Op  Subjective: Ng clamped all day, with no nausea.  Passing flatus, no BM.  Wound is a little red just at the edges of the skin.  He is just frustrated with speed of recovery.  Objective: Vital signs in last 24 hours: Temp:  [97.2 F (36.2 C)-98.6 F (37 C)] 98.6 F (37 C) (02/24 0643) Pulse Rate:  [64-88] 85 (02/24 0643) Resp:  [18-21] 20 (02/24 0643) BP: (122-166)/(55-71) 160/63 mmHg (02/24 0643) SpO2:  [94 %-100 %] 100 % (02/24 0643) Last BM Date: 10/17/13 Nothing PO recorded.  Diet:  NPO No stool or emesis recorded. On TNA Afebrile, BP up some K+ 3.6, mag 1.8, H/h up to 8.8/26.6 Prealbumin 5.2 Intake/Output from previous day: 02/23 0701 - 02/24 0700 In: 2748 [I.V.:500; Blood:660; TPN:1588] Out: 1050 [Urine:1050] Intake/Output this shift:    General appearance: alert, cooperative and no distress Resp: clear to auscultation bilaterally GI: soft, still sore, midline insisions is a little red just at the edges.  He does have BS, and flatus.  Lab Results:   Recent Labs  10/18/13 2240 10/19/13 0620  WBC 8.8 8.5  HGB 8.9* 8.8*  HCT 27.3* 26.6*  PLT 404* 384    BMET  Recent Labs  10/18/13 0527 10/19/13 0620  NA 137 138  K 3.9 3.6*  CL 104 103  CO2 23 21  GLUCOSE 203* 215*  BUN 22 20  CREATININE 0.88 0.79  CALCIUM 9.1 9.3   PT/INR No results found for this basename: LABPROT, INR,  in the last 72 hours   Recent Labs Lab 10/12/13 1425 10/14/13 0546 10/18/13 0527  AST 28 16 10   ALT 18 12 7   ALKPHOS 128* 88 76  BILITOT 0.3 <0.2* 0.3  PROT 7.6 5.9* 5.4*  ALBUMIN 3.5 2.6* 1.9*     Lipase     Component Value Date/Time   LIPASE 42 10/12/2013 1425     Studies/Results: No results found.  Medications: . antiseptic oral rinse  15 mL Mouth Rinse q12n4p  . chlorhexidine  15 mL Mouth Rinse BID  . insulin aspart  0-15 Units Subcutaneous 6 times per day  . insulin glargine  10 Units Subcutaneous Daily  . levothyroxine  25 mcg Intravenous  Daily  . pantoprazole (PROTONIX) IV  40 mg Intravenous QHS   Sche Continuous Infusions: . Marland KitchenTPN (CLINIMIX-E) Adult 110 mL/hr at 10/18/13 1736   And  . fat emulsion 250 mL (10/18/13 1736)   PRN Meds:.HYDROmorphone (DILAUDID) injection, methocarbamol (ROBAXIN) IV, morphine injection, sodium chloride  Prior to Admission medications   Medication Sig Start Date End Date Taking? Authorizing Provider  amLODipine-benazepril (LOTREL) 5-10 MG per capsule Take 1 capsule by mouth every morning.   Yes Historical Provider, MD  glipiZIDE (GLUCOTROL) 5 MG tablet Take 5 mg by mouth daily before breakfast.   Yes Historical Provider, MD  levothyroxine (SYNTHROID, LEVOTHROID) 50 MCG tablet Take 50 mcg by mouth daily before breakfast.   Yes Historical Provider, MD  trimethoprim (TRIMPEX) 100 MG tablet Take 1 tablet (100 mg total) by mouth 1 day or 1 dose. 10/01/13  Yes [name], MD    Assessment/Plan Hx of partial colectomy x 3 the last was an ileorectal anastomosis; 1998, chemotherapy 1997, Now with; Right retroperitoneal psoas mass and  SBO, DUE TO MALIGNANT SMALL BOWEL MASS/TUMOR.  S/P lysis of adhesions, and SB resection 10/14/13 Recurrent Prostate Cancer with Cryotherapy of the prostate 10/01/13 Dr. Gaynelle Arabian  Hypertension AODM Hypothyroid Eczema Osteoarthritis  Anemia with transfusion  Malnutrition on TNA (prealbumin on 10/18/13 5.2)   Plan:  D/c Ng, sips of clears, and increase mobilization. Restart amlodopine and some oral pain meds. Additional K+, restart heparin for DVT coverage this afternoon  He will have more labs on Thursday.   LOS: 7 days    Suesan Mohrmann 10/19/2013

## 2013-10-20 DIAGNOSIS — C179 Malignant neoplasm of small intestine, unspecified: Secondary | ICD-10-CM

## 2013-10-20 LAB — BASIC METABOLIC PANEL
BUN: 21 mg/dL (ref 6–23)
CHLORIDE: 105 meq/L (ref 96–112)
CO2: 21 mEq/L (ref 19–32)
CREATININE: 0.8 mg/dL (ref 0.50–1.35)
Calcium: 9.1 mg/dL (ref 8.4–10.5)
GFR calc Af Amer: >90 mL/min (ref >90)
Glucose, Bld: 183 mg/dL — ABNORMAL HIGH (ref 70–99)
Potassium: 4.1 mEq/L (ref 3.7–5.3)
Sodium: 138 mEq/L (ref 137–147)

## 2013-10-20 LAB — GLUCOSE, CAPILLARY
GLUCOSE-CAPILLARY: 177 mg/dL — AB (ref 70–99)
GLUCOSE-CAPILLARY: 270 mg/dL — AB (ref 70–99)
GLUCOSE-CAPILLARY: 228 mg/dL — AB (ref 70–99)
Glucose-Capillary: 174 mg/dL — ABNORMAL HIGH (ref 70–99)
Glucose-Capillary: 221 mg/dL — ABNORMAL HIGH (ref 70–99)
Glucose-Capillary: 244 mg/dL — ABNORMAL HIGH (ref 70–99)
Glucose-Capillary: 245 mg/dL — ABNORMAL HIGH (ref 70–99)

## 2013-10-20 MED ORDER — ACETAMINOPHEN 325 MG PO TABS
650.0000 mg | ORAL_TABLET | Freq: Four times a day (QID) | ORAL | Status: DC | PRN
Start: 2013-10-20 — End: 2013-10-22
  Administered 2013-10-22 (×2): 650 mg via ORAL
  Filled 2013-10-20 (×2): qty 2

## 2013-10-20 MED ORDER — INSULIN REGULAR HUMAN 100 UNIT/ML IJ SOLN
INTRAVENOUS | Status: AC
  Administered 2013-10-20: 17:00:00 via INTRAVENOUS
  Filled 2013-10-20: qty 2640

## 2013-10-20 MED ORDER — OXYCODONE-ACETAMINOPHEN 5-325 MG PO TABS
1.0000 | ORAL_TABLET | ORAL | Status: DC | PRN
  Administered 2013-10-20 – 2013-10-22 (×6): 1 via ORAL
  Filled 2013-10-20 (×6): qty 1

## 2013-10-20 MED ORDER — FAT EMULSION 20 % IV EMUL
250.0000 mL | INTRAVENOUS | Status: AC
  Administered 2013-10-20: 250 mL via INTRAVENOUS
  Filled 2013-10-20: qty 250

## 2013-10-20 MED ORDER — ADULT MULTIVITAMIN W/MINERALS CH
1.0000 | ORAL_TABLET | Freq: Every day | ORAL | Status: DC
  Administered 2013-10-20 – 2013-10-22 (×3): 1 via ORAL
  Filled 2013-10-20 (×3): qty 1

## 2013-10-20 MED ORDER — PANTOPRAZOLE SODIUM 40 MG PO TBEC
40.0000 mg | DELAYED_RELEASE_TABLET | Freq: Every day | ORAL | Status: DC
  Administered 2013-10-20 – 2013-10-21 (×2): 40 mg via ORAL
  Filled 2013-10-20 (×2): qty 1

## 2013-10-20 MED ORDER — AMLODIPINE BESYLATE 5 MG PO TABS: 5.0000 mg | ORAL_TABLET | Freq: Every day | ORAL | Status: DC

## 2013-10-20 MED ORDER — LEVOTHYROXINE SODIUM 50 MCG PO TABS
50.0000 ug | ORAL_TABLET | Freq: Every day | ORAL | Status: DC
  Filled 2013-10-20: qty 1

## 2013-10-20 NOTE — Progress Notes (Signed)
6 Days Post-Op  Subjective: Feels much better having multiple BM.  Objective: Vital signs in last 24 hours: Temp:  [97.4 F (36.3 C)-98.5 F (36.9 C)] 98.5 F (36.9 C) (02/25 0502) Pulse Rate:  [79-87] 79 (02/25 0502) Resp:  [17-18] 17 (02/25 0502) BP: (156-166)/(67-78) 156/67 mmHg (02/25 0502) SpO2:  [97 %-100 %] 98 % (02/25 0502) Last BM Date: 10/19/13 Nothing PO recorded yesterday,  6 X stools recorded only I/O recorded yesterday Afebrile, BP up some  K+ up to 4.1 Intake/Output from previous day:   Intake/Output this shift:    General appearance: alert, cooperative and no distress Resp: clear to auscultation bilaterally GI: soft + BS and BM.  Incision is still a little red.  Will watch   Lab Results:   Recent Labs  10/18/13 2240 10/19/13 0620  WBC 8.8 8.5  HGB 8.9* 8.8*  HCT 27.3* 26.6*  PLT 404* 384    BMET  Recent Labs  10/19/13 0620 10/20/13 0425  NA 138 138  K 3.6* 4.1  CL 103 105  CO2 21 21  GLUCOSE 215* 183*  BUN 20 21  CREATININE 0.79 0.80  CALCIUM 9.3 9.1   PT/INR No results found for this basename: LABPROT, INR,  in the last 72 hours   Recent Labs Lab 10/14/13 0546 10/18/13 0527  AST 16 10  ALT 12 7  ALKPHOS 88 76  BILITOT <0.2* 0.3  PROT 5.9* 5.4*  ALBUMIN 2.6* 1.9*     Lipase     Component Value Date/Time   LIPASE 42 10/12/2013 1425     Studies/Results: No results found.  Medications: . amLODipine  5 mg Oral Daily  . antiseptic oral rinse  15 mL Mouth Rinse q12n4p  . chlorhexidine  15 mL Mouth Rinse BID  . heparin subcutaneous  5,000 Units Subcutaneous 3 times per day  . insulin aspart  0-15 Units Subcutaneous 6 times per day  . insulin glargine  10 Units Subcutaneous Daily  . levothyroxine  25 mcg Intravenous Daily  . pantoprazole (PROTONIX) IV  40 mg Intravenous QHS   Prior to Admission medications   Medication Sig Start Date End Date Taking? Authorizing Provider  amLODipine-benazepril (LOTREL) 5-10 MG per  capsule Take 1 capsule by mouth every morning.   Yes Historical Provider, MD  glipiZIDE (GLUCOTROL) 5 MG tablet Take 5 mg by mouth daily before breakfast.   Yes Historical Provider, MD  levothyroxine (SYNTHROID, LEVOTHROID) 50 MCG tablet Take 50 mcg by mouth daily before breakfast.   Yes Historical Provider, MD  trimethoprim (TRIMPEX) 100 MG tablet Take 1 tablet (100 mg total) by mouth 1 day or 1 dose. 10/01/13  Yes [name], MD    Assessment/Plan Hx of partial colectomy x 3 the last was an ileorectal anastomosis; 1998, chemotherapy 1997.  Now with; Right retroperitoneal psoas mass and SBO, DUE TO MALIGNANT SMALL BOWEL MASS/TUMOR.  S/P lysis of adhesions, and SB resection 10/14/13 (I do not see path report) Recurrent Prostate Cancer with Cryotherapy of the prostate 10/01/13 Dr. Gaynelle Arabian  Hypertension  AODM  Hypothyroid  Eczema  Osteoarthritis  Anemia with transfusion  Malnutrition on TNA (prealbumin on 10/18/13 5.2)   Plan:  Clears, PO pain medicines, continue to mobilize,  Start weaning TNA tomorrow.    LOS: 8 days    Dean Owens 10/20/2013

## 2013-10-20 NOTE — Progress Notes (Signed)
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 and possible treatments. His case will be presented in GI conference likely next week. He would prefer to be followed thorough our GI group (dDr Benay Spice) and we will set that up as outpatient. May d/c to home at your discretion  Will sign off at this point

## 2013-10-20 NOTE — Progress Notes (Addendum)
PARENTERAL NUTRITION CONSULT NOTE - FOLLOW UP  Pharmacy Consult:  TPN  Indication:  Bowel obstruction  No Known Allergies  Patient Measurements: Height: 6' (182.9 cm) Weight: 210 lb (95.255 kg) IBW/kg (Calculated) : 77.6  Vital Signs: Temp: 98.5 F (36.9 C) (02/25 0502) Temp src: Oral (02/25 0502) BP: 156/67 mmHg (02/25 0502) Pulse Rate: 79 (02/25 0502)  Labs:  Recent Labs  10/18/13 0527 10/18/13 2240 10/19/13 0620  WBC 8.5 8.8 8.5  HGB 6.7* 8.9* 8.8*  HCT 21.6* 27.3* 26.6*  PLT 393 404* 384     Recent Labs  10/18/13 0527 10/19/13 0620 10/20/13 0425  NA 137 138 138  K 3.9 3.6* 4.1  CL 104 103 105  CO2 23 21 21   GLUCOSE 203* 215* 183*  BUN 22 20 21   CREATININE 0.88 0.79 0.80  CALCIUM 9.1 9.3 9.1  MG 1.6 1.8  --   PHOS 3.7  --   --   PROT 5.4*  --   --   ALBUMIN 1.9*  --   --   AST 10  --   --   ALT 7  --   --   ALKPHOS 76  --   --   BILITOT 0.3  --   --   PREALBUMIN 5.2*  --   --   TRIG 114  --   --    Estimated Creatinine Clearance: 107.3 ml/min (by C-G formula based on Cr of 0.8).    Recent Labs  10/19/13 2002 10/20/13 0001 10/20/13 0339  GLUCAP 260* 174* 177*     Insulin requirement in the past 24 hours: 31 units sensitive SSI + Lantus 10 units (started 2/22) + 60 units regular insulin in TPN  Assessment: 58 YOM with history of prostate and colon cancer found to have two new intra-abdominal masses that are likely the cause of bowel obstruction.  He is s/p SBR on 10/14/13 and to continue on TPN for nutritional support.  Patient's bowel function is returning.  Patient to start clear liquid diet and he denies any nausea/vomiting.   GI: two intra-abd masses likely causing bowel obstruction, s/p SBR 2/19, plan for genetic testing as oupt, may obtain MRI of psoas mass.  +flatus and small BM 2/23, prealbumin low at 5.2 likely d/t stress of illness, TG WNL - bisacodyl x 1, PPI IV Endo: hypothyroidism on IV Synthroid.  DM2, CBGs starting to  improve Lytes: all WNL except CoCa mildly elevated at 10.78 - watch Renal: SCr 0.8, CrCL 107 ml/min, decent UOP Cards: HTN / HLD - BP elevated, HR normal - Norvasc added 2/24 Hepatobil: LFTs/tbili WNL Neuro: A&O Heme/Onc: prostate cancer s/p procedure 2/6, colon cancer s/p colectomy x3 ahd chemo in 1997 ID: afebrile, WBC WNL - not on abx Best Practices: heparin SQ, SCDs, MC TPN Access: PICC placed 10/13/13 TPN day#: 7 (2/18 >> )  Current Nutrition:  TPN  Clear liquid diet  Nutritional Goals: 2380 - 2560 kCal,  125 - 140 gm protein   Plan:  - Continue Clinimix E 5/15 at 110 ml/hr + IVFE at 10 ml/hr.  TPN provides 2354 kCal ad 132gm protein daily, meeting 100% of needs - Daily PO multivitamin (no trace elements in TPN because PO multivitamin contains trace elements) - Continue Lantus 10 units SQ daily and moderate SSI.  Increase insulin in TPN to 75 units to minimize SSI use. - F/U labs in AM - F/U with PO intake to start weaning TPN    Euna Armon D. Mina Marble,  PharmD, BCPS Pager:  [phone] 10/20/2013, 9:13 AM

## 2013-10-20 NOTE — Progress Notes (Signed)
Tolerating clears Abdomen soft, TN PO pain meds Wean TNA Oncology has seen the patient.   Dean Owens. Georgette Dover, MD, [facility]  General/ Trauma Surgery  10/20/2013 12:11 PM

## 2013-10-20 NOTE — Discharge Instructions (Addendum)
Home health RN will be provided by [facility], [place] [phone]  OPEN ABDOMINAL SURGERY: POST OP INSTRUCTIONS  Always review your discharge instruction sheet given to you by the facility where your surgery was performed.  IF YOU HAVE DISABILITY OR FAMILY LEAVE FORMS, YOU MUST BRING THEM TO THE OFFICE FOR PROCESSING.  PLEASE DO NOT GIVE THEM TO YOUR DOCTOR.  1. A prescription for pain medication may be given to you upon discharge.  Take your pain medication as prescribed, if needed.  If narcotic pain medicine is not needed, then you may take acetaminophen (Tylenol) or ibuprofen (Advil) as needed. 2. Take your usually prescribed medications unless otherwise directed. 3. If you need a refill on your pain medication, please contact your pharmacy. They will contact our office to request authorization.  Prescriptions will not be filled after 5pm or on week-ends. 4. You should follow a light diet the first few days after arrival home, such as soup and crackers, pudding, etc.unless your doctor has advised otherwise. A high-fiber, low fat diet can be resumed as tolerated.   Be sure to include lots of fluids daily. Most patients will experience some swelling and bruising on the chest and neck area.  Ice packs will help.  Swelling and bruising can take several days to resolve 5. Most patients will experience some swelling and bruising in the area of the incision. Ice pack will help. Swelling and bruising can take several days to resolve..  6. It is common to experience some constipation if taking pain medication after surgery.  Increasing fluid intake and taking a stool softener will usually help or prevent this problem from occurring.  A mild laxative (Milk of Magnesia or Miralax) should be taken according to package directions if there are no bowel movements after 48 hours. 7.  You may have steri-strips (small skin tapes) in place directly over the incision.   These strips should be left on the skin for 7-10 days.  If your surgeon used skin glue on the incision, you may shower in 24 hours.  The glue will flake off over the next 2-3 weeks.  Any sutures or staples will be removed at the office during your follow-up visit. You may find that a light gauze bandage over your incision may keep your staples from being rubbed or pulled. You may shower and replace the bandage daily. 8. ACTIVITIES:  You may resume regular (light) daily activities beginning the next day--such as daily self-care, walking, climbing stairs--gradually increasing activities as tolerated.  You may have sexual intercourse when it is comfortable.  Refrain from any heavy lifting or straining until approved by your doctor. a. You may drive when you no longer are taking prescription pain medication, you can comfortably wear a seatbelt, and you can safely maneuver your car and apply brakes b. Return to Work: ___________________________________ 86. You should see your doctor in the office for a follow-up appointment approximately two weeks after your surgery.  Make sure that you call for this appointment within a day or two after you arrive home to insure a convenient appointment time. OTHER INSTRUCTIONS:  _____________________________________________________________ _____________________________________________________________  WHEN TO CALL YOUR DOCTOR: 1. Fever over 101.0 2. Inability to urinate 3. Nausea and/or vomiting 4. Extreme swelling or bruising 5. Continued bleeding from incision. 6. Increased pain, redness, or drainage from the incision. 7. Difficulty swallowing or breathing 8. Muscle cramping or spasms. 9. Numbness or tingling in  hands or feet or around lips.  The clinic staff is available to answer your questions during regular business hours.  Please dont hesitate to call and ask to speak to one of the nurses if you have concerns.  For further questions, please visit  [email or web address]

## 2013-10-21 ENCOUNTER — Encounter (HOSPITAL_COMMUNITY): Payer: Self-pay | Admitting: General Surgery

## 2013-10-21 DIAGNOSIS — I10 Essential (primary) hypertension: Secondary | ICD-10-CM

## 2013-10-21 DIAGNOSIS — E119 Type 2 diabetes mellitus without complications: Secondary | ICD-10-CM

## 2013-10-21 DIAGNOSIS — K9189 Other postprocedural complications and disorders of digestive system: Secondary | ICD-10-CM

## 2013-10-21 DIAGNOSIS — K567 Ileus, unspecified: Secondary | ICD-10-CM | POA: Diagnosis not present

## 2013-10-21 DIAGNOSIS — E039 Hypothyroidism, unspecified: Secondary | ICD-10-CM | POA: Diagnosis present

## 2013-10-21 HISTORY — DX: Type 2 diabetes mellitus without complications: E11.9

## 2013-10-21 HISTORY — DX: Essential (primary) hypertension: I10

## 2013-10-21 HISTORY — DX: Hypothyroidism, unspecified: E03.9

## 2013-10-21 LAB — COMPREHENSIVE METABOLIC PANEL
ALBUMIN: 2.1 g/dL — AB (ref 3.5–5.2)
ALT: 38 U/L (ref 0–53)
AST: 31 U/L (ref 0–37)
Alkaline Phosphatase: 242 U/L — ABNORMAL HIGH (ref 39–117)
BUN: 22 mg/dL (ref 6–23)
CALCIUM: 9 mg/dL (ref 8.4–10.5)
CO2: 19 mEq/L (ref 19–32)
Chloride: 104 mEq/L (ref 96–112)
Creatinine, Ser: 0.81 mg/dL (ref 0.50–1.35)
GFR calc non Af Amer: 90 mL/min — ABNORMAL LOW (ref >90)
GLUCOSE: 189 mg/dL — AB (ref 70–99)
Potassium: 4 mEq/L (ref 3.7–5.3)
SODIUM: 139 meq/L (ref 137–147)
Total Bilirubin: 0.3 mg/dL (ref 0.3–1.2)
Total Protein: 5.8 g/dL — ABNORMAL LOW (ref 6.0–8.3)

## 2013-10-21 LAB — GLUCOSE, CAPILLARY
GLUCOSE-CAPILLARY: 184 mg/dL — AB (ref 70–99)
GLUCOSE-CAPILLARY: 189 mg/dL — AB (ref 70–99)
GLUCOSE-CAPILLARY: 195 mg/dL — AB (ref 70–99)
Glucose-Capillary: 187 mg/dL — ABNORMAL HIGH (ref 70–99)
Glucose-Capillary: 214 mg/dL — ABNORMAL HIGH (ref 70–99)

## 2013-10-21 LAB — PHOSPHORUS: Phosphorus: 4.3 mg/dL (ref 2.3–4.6)

## 2013-10-21 LAB — MAGNESIUM: MAGNESIUM: 1.9 mg/dL (ref 1.5–2.5)

## 2013-10-21 MED ORDER — LEVOTHYROXINE SODIUM 50 MCG PO TABS
50.0000 ug | ORAL_TABLET | Freq: Every day | ORAL | Status: DC
  Administered 2013-10-21 – 2013-10-22 (×2): 50 ug via ORAL
  Filled 2013-10-21 (×2): qty 1

## 2013-10-21 MED ORDER — INSULIN ASPART 100 UNIT/ML ~~LOC~~ SOLN
0.0000 [IU] | Freq: Three times a day (TID) | SUBCUTANEOUS | Status: DC
  Administered 2013-10-21: 5 [IU] via SUBCUTANEOUS

## 2013-10-21 MED ORDER — AMLODIPINE BESY-BENAZEPRIL HCL 5-10 MG PO CAPS: 1.0000 | ORAL_CAPSULE | Freq: Every morning | ORAL | Status: DC

## 2013-10-21 MED ORDER — BENAZEPRIL HCL 10 MG PO TABS
10.0000 mg | ORAL_TABLET | Freq: Every day | ORAL | Status: DC
  Administered 2013-10-21 – 2013-10-22 (×2): 10 mg via ORAL
  Filled 2013-10-21: qty 1

## 2013-10-21 MED ORDER — AMLODIPINE BESYLATE 5 MG PO TABS
5.0000 mg | ORAL_TABLET | Freq: Every day | ORAL | Status: DC
  Administered 2013-10-21 – 2013-10-22 (×2): 5 mg via ORAL
  Filled 2013-10-21: qty 1

## 2013-10-21 MED ORDER — INSULIN ASPART 100 UNIT/ML ~~LOC~~ SOLN: 0.0000 [IU] | Freq: Three times a day (TID) | SUBCUTANEOUS | Status: DC

## 2013-10-21 MED ORDER — GLIPIZIDE 5 MG PO TABS
5.0000 mg | ORAL_TABLET | Freq: Every day | ORAL | Status: DC
  Administered 2013-10-21 – 2013-10-22 (×2): 5 mg via ORAL
  Filled 2013-10-21 (×3): qty 1

## 2013-10-21 MED ORDER — INSULIN ASPART 100 UNIT/ML ~~LOC~~ SOLN: 0.0000 [IU] | Freq: Every day | SUBCUTANEOUS | Status: DC

## 2013-10-21 MED ORDER — TRIMETHOPRIM 100 MG PO TABS
100.0000 mg | ORAL_TABLET | ORAL | Status: DC
  Administered 2013-10-21 – 2013-10-22 (×2): 100 mg via ORAL
  Filled 2013-10-21 (×3): qty 1

## 2013-10-21 NOTE — Progress Notes (Signed)
Path report finalized. 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 - TUMOR INVADES THROUGH MUSCULARIS PROPRIA INTO SUBSEROSAL TISSUES. - THREE OF SIX LYMPH NODES ARE POSITIVE FOR METASTATIC ADENOCARCINOMA (3/6). - MARGINS ARE NEGATIVE. - SEE ONCOLOGY TEMPLATE. Microscopic Comment SMALL INTESTINE: Specimen: Partial small bowel. Other Organs: None received. Procedure: Partial small bowel resection. Tumor Site: Tumor located in the middle of the partial small bowel resection received (please correlate with operative impression for exact tumor site). Tumor Size: 7.2 cm in greatest dimension. Macroscopic Tumor Perforation: Not identified. Histologic Type: Adenocarcinoma. Histologic Grade G1: Well differentiated. Microscopic Tumor Extension: Tumor invades through muscularis propria into subserosal tissues. Lymphovascular invasion: Definitive lymph/vascular invasion is not identified. however, several lymph nodes are positive, see below. Margins: All margins uninvolved by invasive carcinoma: Distance of invasive carcinoma from closest margin: 11 cm. Specify margin: Proximal margin. Intramucosal carcinoma/adenoma: Yes, a duodenal adenoma is present. 6.8 cm proximal to main tumor mass. Additional Findings: Aside from the tumor and duodenal adenoma there are no additional significant findings. Ancillary testing: See below comments. Lymph nodes: number examined: 6; number positive: 3, see below comments. Pathologic stage: pT3, pN1, MX, see below comments. Comments: 1. Staging comment: In a single slide section, there is focal tumor which appears to abut the serosal surface 1 of 3 FINAL for FIELDS, OROS [phone]) Microscopic Comment(continued) on deeper sectioning; however, no involvement is identified. It is unclear whether the focal tumor represents true involvement of  invasive adenocarcinoma of the serosal surface. As the serosal involvement is not definitive, the tumor is best staged as pT3. Dr. Donato Heinz has seen this case in consultation with agreement of this staging and above comment. 2. Lymph node count: In one of the sections which demonstrates tumor (slide I) there is a well circumscribed nodule of tumor with a small partial lymphoid cuff around it. The morphologic findings are consistent with a positive metastatic lymph node and therefore it is counted as such. 3. Tumor type: Immunohistochemical stains are performed on the tumor. Cytokeratin 7 and CDX-2 are strongly positive, while cytokeratin 20, PSA and prostein are all negative. The morphology coupled with the staining pattern is consistent with a small bowel adenocarcinoma. 4. Molecular testing: The tumor will be sent for MSI testing by PCR and MMR testing for IHC per Dr. Virgie Dad request. (RAH:gt, 10/20/13) Willeen Niece MD Pathologist, Electronic Signature (Case signed 10/20/2013) Intraoperative Diagnosis GROSS OR CONSULT, SMALL BOWEL: FUNGATING SMALL BOWEL  Discussed with patient.  He has oncology follow-up arranged. Will wean off TNA Home tomorrow.  Imogene Burn. Georgette Dover, MD, [facility] Surgery  General/ Trauma Surgery  10/21/2013 10:51 AM

## 2013-10-21 NOTE — Progress Notes (Addendum)
7 Days Post-Op  Subjective: He continues to do well, ready to have TNA off he is tired of all the finger sticks.  Objective: Vital signs in last 24 hours: Temp:  [97.6 F (36.4 C)-99.2 F (37.3 C)] 98.1 F (36.7 C) (02/26 1323) Pulse Rate:  [74-88] 79 (02/26 1323) Resp:  [17-20] 20 (02/26 1323) BP: (151-169)/(69-79) 153/77 mmHg (02/26 1323) SpO2:  [97 %-99 %] 98 % (02/26 1323) Last BM Date: 10/20/13 + BM yesterday, 6 day prior to that.   Going to full liquids today Afebrile, VSS Labs pending Intake/Output from previous day: 02/25 0701 - 02/26 0700 In: 720 [P.O.:720] Out: -  Intake/Output this shift: Total I/O In: 580 [P.O.:580] Out: -   General appearance: alert, cooperative and no distress Resp: clear to auscultation bilaterally GI: soft still sore, still has some serous drainage and a little redness around staples, incision.  + BS, 1 BM yesterday.  Lab Results:   Recent Labs  10/18/13 2240 10/19/13 0620  WBC 8.8 8.5  HGB 8.9* 8.8*  HCT 27.3* 26.6*  PLT 404* 384    BMET  Recent Labs  10/20/13 0425 10/21/13 0510  NA 138 139  K 4.1 4.0  CL 105 104  CO2 21 19  GLUCOSE 183* 189*  BUN 21 22  CREATININE 0.80 0.81  CALCIUM 9.1 9.0   PT/INR No results found for this basename: LABPROT, INR,  in the last 72 hours   Recent Labs Lab 10/18/13 0527 10/21/13 0510  AST 10 31  ALT 7 38  ALKPHOS 76 242*  BILITOT 0.3 0.3  PROT 5.4* 5.8*  ALBUMIN 1.9* 2.1*     Lipase     Component Value Date/Time   LIPASE 42 10/12/2013 1425     Studies/Results: No results found.  Medications: . amLODipine  5 mg Oral Daily   And  . benazepril  10 mg Oral Daily  . antiseptic oral rinse  15 mL Mouth Rinse q12n4p  . chlorhexidine  15 mL Mouth Rinse BID  . glipiZIDE  5 mg Oral QAC breakfast  . heparin subcutaneous  5,000 Units Subcutaneous 3 times per day  . insulin aspart  0-15 Units Subcutaneous TID WC  . insulin aspart  0-5 Units Subcutaneous QHS  .  levothyroxine  50 mcg Oral QAC breakfast  . multivitamin with minerals  1 tablet Oral Daily  . pantoprazole  40 mg Oral QHS  . trimethoprim  100 mg Oral 1 day or 1 dose   Prior to Admission medications   Medication Sig Start Date End Date Taking? Authorizing Provider  amLODipine-benazepril (LOTREL) 5-10 MG per capsule Take 1 capsule by mouth every morning.   Yes Historical Provider, MD  glipiZIDE (GLUCOTROL) 5 MG tablet Take 5 mg by mouth daily before breakfast.   Yes Historical Provider, MD  levothyroxine (SYNTHROID, LEVOTHROID) 50 MCG tablet Take 50 mcg by mouth daily before breakfast.   Yes Historical Provider, MD  trimethoprim (TRIMPEX) 100 MG tablet Take 1 tablet (100 mg total) by mouth 1 day or 1 dose. 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 - TUMOR INVADES THROUGH MUSCULARIS PROPRIA INTO SUBSEROSAL TISSUES. - THREE OF SIX LYMPH NODES ARE POSITIVE FOR METASTATIC ADENOCARCINOMA (3/6). - MARGINS ARE NEGATIVE. - SEE ONCOLOGY TEMPLATE.  Assessment/Plan Hx of partial colectomy x 3 the last was an ileorectal anastomosis; 1998, chemotherapy 1997.  Now with; Right retroperitoneal  psoas mass and SBO, DUE TO MALIGNANT SMALL BOWEL MASS/TUMOR.  S/P lysis of adhesions, and SB resection 10/14/13, Dr. Greer Pickerel.  Recurrent Prostate Cancer with Cryotherapy of the prostate 10/01/13 Dr. Gaynelle Arabian  Hypertension  AODM  Hypothyroid  Eczema  Osteoarthritis  Anemia with transfusion  Malnutrition on TNA (prealbumin on 10/18/13 5.2)   Plan:  Wean TNA off today, advance diet, home next day or two.   Labs OK.  LOS: 9 days    Dean Owens 10/21/2013

## 2013-10-21 NOTE — Discharge Summary (Signed)
Physician Discharge Summary  Patient ID: EKIN BARDO MRN: [id number] DOB/AGE: 1946/07/23 68 y.o.  Admit date: 10/12/2013 Discharge date: 10/22/2013  Admission Diagnoses:  Nausea and vomiting with 2 large intraabdominal tumors History of colon cancer with 3 prior resections 1998, chemotherapy, 1997. Recurrent prostate cancer with recent Cryotherapy of prostate 10/01/13. Hypertension  AODM  Hypothyroid  Eczema  Osteoarthritis   Discharge Diagnoses:  1.  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 With 3/6 lymph nodes positive for metastatic adenocarcinoma. Obstructing 7.7 x 5.9 x 7.5 cm mass involving a small bowel loop in the left abdomen resulting in an apple core type obstruction of of the small bowel. 2.  10.3 x 7.5 x 15.1 cm mixed density mass involving the right ileo psoas muscle 3.  History of colon cancer with 3 prior resections 1998, chemotherapy, 1997. 4.  Recurrent prostate cancer with recent Cryotherapy of prostate 10/01/13. 5.  Hypertension  6.  AODM  7.  Hypothyroid  8.  Eczema  9.  Osteoarthritis  10.  Post op ileus 11.  Malnutrition    Active Problems:   Colon cancer   SBO (small bowel obstruction)   Protein-calorie malnutrition, severe   Small bowel tumor   Type II diabetes mellitus   Prostate cancer   Accelerated hypertension   Ileus, postoperative   Unspecified hypothyroidism   PROCEDURES: S/P lysis of adhesions, and SB resection 10/14/13, Dr. Doreatha Massed Course: Patient is well known to our practice with a previous history of segmental colectomy followed later by a completion colectomy for separate colon cancers. Surgeries were done by Dr. Zachery Dakins.He underwent adjuvant chemotherapy. Additionally, he is undergoing treatment for prostate cancer. He underwent a procedure on February 6 with Dr. Patsi Sears. He presented to the emergency department with three-day history of nausea and vomiting. Workup  included CT scan demonstrating 2 separate intra-abdominal masses consistent with tumor. One involves the small bowel causing a small bowel obstruction. Of note he has retained capsule endoscopy which he swallowed last November during work-up for anemia by Dr. Reece Agar. He is passing some gas and currently feels like he needs to have a bowel movement. He was admitted and taken to the OR on 10/14/13 with resection of the obstructing small bowel mass and primary side to side anastomosis.  He tolerated the procedure well. His hemoglobin post op day 2 was 6.2 and he was transfused 1 unit of PRBC's. A second unit was given on 10/18/13.    He was started on TNA 10/12/13, evening of admit.  Pre albumin on 10/17/13 was 5.2. He had issues with pain and post op ileus.  He was maintained on TNA, and bowel function returned, he was started on clear liquids, and advanced.  He has non insulin dependant diabetes and is on an oral agent at home.  He was maintained on insulin for glucose control while on TNA.  He was mobilized and by 10/22/13 he was eating and drinking without difficulty. His TNA was weaned off and he was ready for discharge.  He was seen by Oncology and Dr. Truett Perna.  They plan to review him at cancer conference next week and he will follow up with Dr. Truett Perna.  He will come back to the clinic next week to have the staples removed.  He will see Dr. Andrey Campanile in 2 weeks.  We sent him home on his pre admit medicines, with instruction to monitor his glucose, and discuss with Dr. Earl Gala next  week if they are poorly controlled. We do not have a biopsy of the right ileo psoas tumor.   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  - TUMOR INVADES THROUGH MUSCULARIS PROPRIA INTO SUBSEROSAL TISSUES.  - THREE OF SIX LYMPH NODES ARE POSITIVE FOR METASTATIC ADENOCARCINOMA (3/6).  - MARGINS ARE NEGATIVE.  - SEE ONCOLOGY TEMPLATE.  T3 at this point further studies are still pending.  Condition on d/c:  Improved   Disposition: 01-Home or Self Care       Future Appointments Provider Department Dept Phone   10/26/2013 10:00 AM [facility] [place] [phone]   11/12/2013 1:30 PM [facility] [phone]   11/12/2013 2:00 PM [place], [facility] [phone]       Medication List         acetaminophen 325 MG tablet  Commonly known as:  TYLENOL  Take 2 tablets (650 mg total) by mouth every 6 (six) hours as needed for mild pain, moderate pain, fever or headache.     amLODipine-benazepril 5-10 MG per capsule  Commonly known as:  LOTREL  Take 1 capsule by mouth every morning.     glipiZIDE 5 MG tablet  Commonly known as:  GLUCOTROL  Take 5 mg by mouth daily before breakfast.     levothyroxine 50 MCG tablet  Commonly known as:  SYNTHROID, LEVOTHROID  Take 50 mcg by mouth daily before breakfast.     multivitamin with minerals Tabs tablet  Take 1 tablet by mouth daily.     oxyCODONE 5 MG immediate release tablet  Commonly known as:  Oxy IR/ROXICODONE  Take 1-2 tablets (5-10 mg total) by mouth every 4 (four) hours as needed for moderate pain.     trimethoprim 100 MG tablet  Commonly known as:  TRIMPEX  Take 1 tablet (100 mg total) by mouth 1 day or 1 dose.       Follow-up Information   Follow up with Horton Finer, MD. (Call and let him follow up and help with your glucose, if it not well controlled, and your blood pressure.  We have put you back on your preadmit medicines for these issues.)    Specialty:  Internal Medicine   Contact information:   [address], [address] [place] [address] [phone]       Follow up with [name], MD. Schedule an appointment as soon as possible for a visit in 2 weeks.   Specialty:  General Surgery    Contact information:   [address] [place] [address] [phone]       Follow up with CCS,MD, MD. (Call and ask for an appointment to have your staples out next week, a "nurse only, " appointment.  You need a seperate appointment to follow up with Dr. Redmond Pulling.[name])    Specialty:  General Surgery   Contact information:   [address] [place] [address] [phone]       Call Carolan Clines I, MD. (As needed)    Specialty:  Urology   Contact information:   [facility]  [place] [address] [phone]       Follow up with Betsy Coder, MD. (His office should call you with follow up appointment.  If you don't hear  by early next week call them for a date.)    Specialty:  Oncology   Contact information:   [place] [address] [phone]       Signed: Earnstine Regal 10/22/2013, 1:21 PM

## 2013-10-22 MED ORDER — OXYCODONE HCL 5 MG PO TABS: 5.0000 mg | ORAL_TABLET | ORAL | Status: DC | PRN

## 2013-10-22 MED ORDER — ACETAMINOPHEN 325 MG PO TABS: 650.0000 mg | ORAL_TABLET | Freq: Four times a day (QID) | ORAL | Status: DC | PRN

## 2013-10-22 MED ORDER — ADULT MULTIVITAMIN W/MINERALS CH: 1.0000 | ORAL_TABLET | Freq: Every day | ORAL | Status: DC

## 2013-10-22 NOTE — Discharge Summary (Signed)
Benancio Osmundson K. Aniyla Harling, MD, FACS [facility]  General/ Trauma Surgery  10/22/2013 1:58 PM  

## 2013-10-22 NOTE — Discharge Planning (Signed)
Patient discharged home in stable condition. Verbalizes understanding of all discharge instructions, including home medications and follow up appointments. 

## 2013-10-22 NOTE — Progress Notes (Signed)
8 Days Post-Op  Subjective: He feels good, tolerated PO's well walking with a walker, more  For security than anything else.  His BM is OK, no significant pain issues.  He is ready to go home.  Dr. Benay Spice saw him this AM and discussed things and he knows about follow up plan with Oncology.  Objective: Vital signs in last 24 hours: Temp:  [98.1 F (36.7 C)-98.3 F (36.8 C)] 98.1 F (36.7 C) (02/27 0543) Pulse Rate:  [79-88] 79 (02/27 0543) Resp:  [18-20] 18 (02/27 0543) BP: (150-156)/(66-77) 150/66 mmHg (02/27 0543) SpO2:  [98 %-99 %] 99 % (02/27 0543) Last BM Date: 10/21/13 1420 PO +BM Afebrile, VSS Glucose is still up Intake/Output from previous day: 02/26 0701 - 02/27 0700 In: 1440 [P.O.:1420; I.V.:20] Out: -  Intake/Output this shift:    General appearance: alert, cooperative and no distress Resp: clear to auscultation bilaterally GI: soft sore, + BS, incision is still a little red especially around the staples.  There was some crusting along the incision and I have cleaned that with soap and water.  I got most of it off.  Lab Results:  No results found for this basename: WBC, HGB, HCT, PLT,  in the last 72 hours  BMET  Recent Labs  10/20/13 0425 10/21/13 0510  NA 138 139  K 4.1 4.0  CL 105 104  CO2 21 19  GLUCOSE 183* 189*  BUN 21 22  CREATININE 0.80 0.81  CALCIUM 9.1 9.0   PT/INR No results found for this basename: LABPROT, INR,  in the last 72 hours   Recent Labs Lab 10/18/13 0527 10/21/13 0510  AST 10 31  ALT 7 38  ALKPHOS 76 242*  BILITOT 0.3 0.3  PROT 5.4* 5.8*  ALBUMIN 1.9* 2.1*     Lipase     Component Value Date/Time   LIPASE 42 10/12/2013 1425     Studies/Results: No results found.  Medications: . amLODipine  5 mg Oral Daily   And  . benazepril  10 mg Oral Daily  . antiseptic oral rinse  15 mL Mouth Rinse q12n4p  . chlorhexidine  15 mL Mouth Rinse BID  . glipiZIDE  5 mg Oral QAC breakfast  . heparin subcutaneous  5,000  Units Subcutaneous 3 times per day  . levothyroxine  50 mcg Oral QAC breakfast  . multivitamin with minerals  1 tablet Oral Daily  . pantoprazole  40 mg Oral QHS  . trimethoprim  100 mg Oral 1 day or 1 dose    Assessment/Plan 1. 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 With 3/6 lymph nodes positive for metastatic adenocarcinoma.  Obstructing 7.7 x 5.9 x 7.5 cm mass involving a small bowel loop in the left abdomen resulting in an apple core type obstruction of of the small bowel.  S/p S/P lysis of adhesions, and SB resection 10/14/13, Dr. Greer Pickerel 2. 10.3 x 7.5 x 15.1 cm mixed density mass involving the right ileo  psoas muscle  3. History of colon cancer with 3 prior resections 1998, chemotherapy, 1997.  4. Recurrent prostate cancer with recent Cryotherapy of prostate 10/01/13.  5. Hypertension  6. AODM (NID prior to admit) 7. Hypothyroid  8. Eczema  9. Osteoarthritis  10. Post op ileus  11. Malnutrition   Plan:  Remove his PICC, I have ask him to check his glucose at home and report to Dr. Maxwell Caul on Monday.  I will send him home on his home  medicines as before.  He want to do just plain tylenol with OxyIR so I will make that change.  D/c this AM.     LOS: 10 days    Atonya Templer 10/22/2013

## 2013-10-22 NOTE — Progress Notes (Signed)
Oncology follow-up arranged. Ready for discharge. Staples out next week.  Imogene Burn. Georgette Dover, MD, [facility] Surgery  General/ Trauma Surgery  10/22/2013 11:29 AM

## 2013-10-23 ENCOUNTER — Other Ambulatory Visit: Payer: Self-pay | Admitting: Oncology

## 2013-10-26 ENCOUNTER — Ambulatory Visit (INDEPENDENT_AMBULATORY_CARE_PROVIDER_SITE_OTHER): Payer: Medicare Other

## 2013-10-26 ENCOUNTER — Other Ambulatory Visit (HOSPITAL_COMMUNITY): Payer: Medicare Other

## 2013-10-26 ENCOUNTER — Encounter (HOSPITAL_COMMUNITY): Payer: Self-pay

## 2013-10-26 DIAGNOSIS — Z4802 Encounter for removal of sutures: Secondary | ICD-10-CM

## 2013-10-26 NOTE — Progress Notes (Signed)
Pt is s/p exploratory laparotomy on 10/14/13 by Dr. Redmond Pulling.  He is here today for staple removal.  The patient is still having some pain and soreness, but overall he states he is doing well.  Bandage was removed.  The wound is healing nicely with slight erythema around the staples.  Staples removed and steri strips placed.  Pt was given instructions on showering and lifting restrictions.  He asked about return to work and I told him Dr. Redmond Pulling would discuss this with him at his post op appointment on 10/28/13.  Pt understood and agreed to this plan.

## 2013-10-27 ENCOUNTER — Telehealth: Payer: Self-pay | Admitting: Oncology

## 2013-10-27 ENCOUNTER — Telehealth: Payer: Self-pay | Admitting: *Deleted

## 2013-10-27 NOTE — Telephone Encounter (Signed)
Pt called  gave him appt for

## 2013-10-27 NOTE — Telephone Encounter (Signed)
Spoke with patient and confirmed appointments.  He had no questions and was given directions, contact names and numbers.

## 2013-10-28 ENCOUNTER — Ambulatory Visit (INDEPENDENT_AMBULATORY_CARE_PROVIDER_SITE_OTHER): Payer: Medicare Other | Admitting: General Surgery

## 2013-10-28 ENCOUNTER — Encounter (INDEPENDENT_AMBULATORY_CARE_PROVIDER_SITE_OTHER): Payer: Self-pay | Admitting: General Surgery

## 2013-10-28 VITALS — BP 128/68 | HR 115 | Temp 97.6°F | Resp 16 | Ht 71.0 in | Wt 208.0 lb

## 2013-10-28 DIAGNOSIS — Z09 Encounter for follow-up examination after completed treatment for conditions other than malignant neoplasm: Secondary | ICD-10-CM

## 2013-10-28 MED ORDER — OXYCODONE HCL 5 MG PO TABS: 5.0000 mg | ORAL_TABLET | ORAL | Status: DC | PRN

## 2013-10-28 NOTE — Patient Instructions (Signed)
Take stool softner &/or laxative while on pain med Start flomax and see how that helps with urination Eat several small meals a day. Consider meal replacement shakes like boost, ensure, carnation instant breakfast; can add protein powder to these as well Get labs drawn

## 2013-10-28 NOTE — Progress Notes (Signed)
Subjective:     Patient ID: Dean Owens, male   DOB: 05-07-1946, 68 y.o.   MRN: [id number]  HPI 68 year old Caucasian male comes in for followup after undergoing surgery on February 19 For a malignant small bowel obstruction. He has a prolonged history of colon cancer requiring multiple colectomies. He came in with a partial small bowel obstruction due to a malignant tumor. He also had another mass in his abdomen mainly in the right retroperitoneal which was left alone. He is in the hospital from Feb 17 through the 27th. He denies any fevers but reports chills today area he states his appetite is horrible. He gets full very easily. He has had some constipation which is then relieved by milk of magnesia. He is still having to take pain medicine on occasion. He also reports some incontinence to urine. He states that he has difficulty getting from his bed to the commode without having some urinary incontinence. He has upcoming appointments with oncology.  Review of Systems     Objective:   Physical Exam BP 128/68  Pulse 115  Temp(Src) 97.6 F (36.4 C) (Oral)  Resp 16  Ht 5\' 11"  (1.803 m)  Wt 208 lb (94.348 kg)  BMI 29.02 kg/m2  Gen: alert, NAD, non-toxic appearing Pupils: equal, no scleral icterus Pulm: Lungs clear to auscultation, symmetric chest rise CV: regular rate and rhythm Abd: soft, nontender, nondistended. Almost healed midline incision, scant drainage on steristrips. No cellulitis. No incisional hernia Ext: no edema, no calf tenderness Skin: no rash, no jaundice     Assessment:     Malignant small bowel obstruction status post exploratory laparoscopy, lysis of adhesions, small bowel resection with anastomosis T3 N1 small bowel adenocarcinoma     Plan:     Overall I think he is doing as well as can be expected. We discussed that his appetite may be off for several weeks. We discussed the importance of eating small meals as opposed to 3 large meals. We discussed meal  replacement shakes like boost, Ensure, or [employer].  We discussed his pathology report which he hardly gotten a copy of while in the hospital. I explained that he will need additional workup which will include a chest CT and biopsy of the right retroperitoneal mass that oncology will handle. He was given a refill on his pain medication. With respect to the urine issue he has some Flomax at home and I encouraged him to start taking that and see if that resolves the problem. If not he was instructed to contact Dr. Gaynelle Arabian. We will check labs and make sure his electrolytes and blood counts are stable. Followup with me in 4 weeks for wound check  Leighton Ruff. Redmond Pulling, MD, FACS General, Bariatric, & Minimally Invasive Surgery [facility] Surgery, [place]

## 2013-11-01 ENCOUNTER — Encounter (HOSPITAL_COMMUNITY): Admission: RE | Payer: Self-pay | Source: Ambulatory Visit

## 2013-11-01 ENCOUNTER — Inpatient Hospital Stay (HOSPITAL_COMMUNITY): Admission: RE | Admit: 2013-11-01 | Payer: Medicare Other | Source: Ambulatory Visit | Admitting: Orthopedic Surgery

## 2013-11-01 HISTORY — DX: Essential (primary) hypertension: I10

## 2013-11-01 HISTORY — DX: Malignant neoplasm of prostate: C61

## 2013-11-01 HISTORY — DX: Unilateral primary osteoarthritis, right knee: M17.11

## 2013-11-01 HISTORY — DX: Hypothyroidism, unspecified: E03.9

## 2013-11-01 HISTORY — DX: Type 2 diabetes mellitus without complications: E11.9

## 2013-11-01 HISTORY — DX: Dermatitis, unspecified: L30.9

## 2013-11-01 HISTORY — DX: Personal history of other malignant neoplasm of large intestine: Z85.038

## 2013-11-01 HISTORY — DX: Male erectile dysfunction, unspecified: N52.9

## 2013-11-01 SURGERY — ARTHROPLASTY, KNEE, TOTAL
Anesthesia: General | Site: Knee | Laterality: Right

## 2013-11-09 ENCOUNTER — Encounter (INDEPENDENT_AMBULATORY_CARE_PROVIDER_SITE_OTHER): Payer: Self-pay | Admitting: General Surgery

## 2013-11-10 ENCOUNTER — Telehealth (INDEPENDENT_AMBULATORY_CARE_PROVIDER_SITE_OTHER): Payer: Self-pay | Admitting: General Surgery

## 2013-11-10 NOTE — Telephone Encounter (Signed)
Reviewed labs from PCP done on 3/12 wbc 20, hgb 9.8; K elevated. Denies n/v/f/c/abd pain. Reports feeling well. Probably elevated wbc is more due to dehydration during lab check which would explain increased Hgb by 1 pioint after hospital discharge. Since lack of symptoms will not pursue workup of wbc. Pt getting repeat labs at PCPs today

## 2013-11-12 ENCOUNTER — Other Ambulatory Visit: Payer: Self-pay | Admitting: Radiology

## 2013-11-12 ENCOUNTER — Encounter: Payer: Self-pay | Admitting: Oncology

## 2013-11-12 ENCOUNTER — Encounter (INDEPENDENT_AMBULATORY_CARE_PROVIDER_SITE_OTHER): Payer: Self-pay

## 2013-11-12 ENCOUNTER — Other Ambulatory Visit: Payer: Self-pay | Admitting: *Deleted

## 2013-11-12 ENCOUNTER — Telehealth: Payer: Self-pay | Admitting: Oncology

## 2013-11-12 ENCOUNTER — Ambulatory Visit (HOSPITAL_BASED_OUTPATIENT_CLINIC_OR_DEPARTMENT_OTHER): Payer: Medicare Other | Admitting: Oncology

## 2013-11-12 ENCOUNTER — Ambulatory Visit: Payer: Medicare Other

## 2013-11-12 VITALS — BP 155/74 | HR 113 | Temp 97.6°F | Resp 18 | Ht 71.0 in | Wt 199.8 lb

## 2013-11-12 DIAGNOSIS — D490 Neoplasm of unspecified behavior of digestive system: Secondary | ICD-10-CM

## 2013-11-12 DIAGNOSIS — C179 Malignant neoplasm of small intestine, unspecified: Secondary | ICD-10-CM

## 2013-11-12 DIAGNOSIS — R1900 Intra-abdominal and pelvic swelling, mass and lump, unspecified site: Secondary | ICD-10-CM

## 2013-11-12 DIAGNOSIS — C189 Malignant neoplasm of colon, unspecified: Secondary | ICD-10-CM

## 2013-11-12 DIAGNOSIS — E119 Type 2 diabetes mellitus without complications: Secondary | ICD-10-CM

## 2013-11-12 DIAGNOSIS — Z800 Family history of malignant neoplasm of digestive organs: Secondary | ICD-10-CM

## 2013-11-12 DIAGNOSIS — Z85038 Personal history of other malignant neoplasm of large intestine: Secondary | ICD-10-CM

## 2013-11-12 DIAGNOSIS — E039 Hypothyroidism, unspecified: Secondary | ICD-10-CM

## 2013-11-12 DIAGNOSIS — I10 Essential (primary) hypertension: Secondary | ICD-10-CM

## 2013-11-12 DIAGNOSIS — D509 Iron deficiency anemia, unspecified: Secondary | ICD-10-CM

## 2013-11-12 NOTE — Progress Notes (Signed)
Checked in new pt with no financial concerns. °

## 2013-11-12 NOTE — Telephone Encounter (Signed)
Gave pt appt for lab and MD, Biopsy,CT ,nutrition, annd genetic consult for MAy and April

## 2013-11-12 NOTE — Progress Notes (Signed)
  Met with Dean Owens and family. Explained role of nurse navigator. Educational information provided on small bowel cancer  Referral made to dietician for diet education. [place] resources provided to patient, including SW service and support group information.  Contact names and phone numbers were provided for entire [facility] team.  Teach back method was used.   Will continue to follow as needed.

## 2013-11-12 NOTE — Progress Notes (Signed)
[facility] Patient Consult   Referring MD: Tamarcus Condie 68 y.o.  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 He reports being diagnosed with colon cancer twice in 1997 and underwent adjuvant chemotherapy (Dr. Sonny Dandy) after the second cancer. He was diagnosed with colon cancer and a third occasion in 1999 and underwent a completion colectomy by Dr. Rise Patience. He has remained in clinical remission from colon cancer.  In November 2014 he underwent evaluation for iron deficiency by Dr. Wynetta Emery. He had a negative endoscopic evaluation. A capsule endoscopy 07/05/2013, the capsule never passed. He presented to the emergency room with nausea and vomiting on 10/12/2013 and a CT of the abdomen and pelvis revealed a soft tissue mass involving a small bowel loop resulting in obstruction of the small bowel. The endoscopic camera was noted just above the small bowel obstruction. An additional mixed density mass was noted to involve the right iliopsoas muscle.  He was taken the operating room by Dr. Redmond Pulling on 10/14/2013 and underwent an exploratory laparotomy, lysis of adhesions, and small bowel resection. A large mass was noted in the left abdomen that incorporated a section of mid small bowel. The tumor appeared to involve the mid jejunum. The pathology ([id number]) revealed an invasive well-differentiated adenocarcinoma. Tumor was noted to invade through the muscular spur. Into the subserosal tissues. 3 of 6 lymph nodes were positive for metastatic adenocarcinoma. The surgical margins were negative. An additional duodenal adenoma was present. The morphology in combination with the immunohistochemical staining pattern is consistent with a small bowel adenocarcinoma.  The tumor returned microsatellite instability 5. There was loss of MSH2 expression and equivocal expression of  MSH6.  He has recovered from surgery. Dean Owens reports a family member was diagnosed with hereditary colon cancer based on testing performed in [place].  Past Medical History  Diagnosis Date  . Hypertension   . Hyperlipidemia   . Type 2 diabetes mellitus   . Hypothyroidism   . History of colon cancer NO RECURRENCE    S/P COLECTOMY X3  LAST ONE 1998--  [place]  . Recurrent prostate carcinoma FOLLOWED BY DR Gaynelle Arabian    DX 2007  S/P RADIOACTIVE SEED IMPLANTS, cryotherapy February 2015   . ED (erectile dysfunction) of organic origin   . Right knee DJD   . Eczema   . OA (osteoarthritis)     LEFT KNEE  . Wears glasses   . History of iron deficiency anemia   . History of squamous cell carcinoma excision-nose    .    .    .    [name]      Past Surgical History  Procedure Laterality Date  . Givens capsule study N/A 07/05/2013    Procedure: GIVENS CAPSULE STUDY;  Surgeon: [name], MD;  Location: [facility];  Service: Endoscopy;  Laterality: N/A;  . Radioactive prostate seed implants  OCT 2007  . Knee arthroscopy Bilateral 2006 &  2007  . Tonsillectomy  AS CHILD  . Appendectomy  AGE 87  . Cryoablation N/A 10/01/2013    Procedure: CRYO ABLATION PROSTATE;  Surgeon: [name], MD;  Location: [facility];  Service: Urology;  Laterality: N/A;  . Prostate surgery    . Right colectomy  09/07/1995  . Sigmoidectomy  11/13/95  . Total colectomy  12/29/1997  ileorectal anastomosis  . Laparotomy N/A 10/14/2013    Procedure: EXPLORATORY LAPAROTOMY;  Surgeon: [name], MD;  Location: [facility];  Service: General;  Laterality: N/A;  . Bowel resection N/A 10/14/2013    Procedure: SMALL BOWEL RESECTION;  Surgeon: [name], MD;  Location: [facility];  Service: General;  Laterality: N/A;  . Lysis of adhesion N/A 10/14/2013    Procedure: LYSIS OF ADHESION;  Surgeon: [name], MD;  Location: [facility];  Service: General;  Laterality: N/A;    Family  history: 2 brothers, 3 children. A brother was diagnosed with colon cancer at age 45 and died of lung cancer at age 73. His paternal grandmother died of colon cancer. A paternal aunt had colon cancer. 2 paternal cousins have a history of colon cancer. No other family history of cancer.   Current outpatient prescriptions:acetaminophen (TYLENOL) 325 MG tablet, Take 2 tablets (650 mg total) by mouth every 6 (six) hours as needed for mild pain, moderate pain, fever or headache., Disp: , Rfl: ;  amLODipine-benazepril (LOTREL) 5-10 MG per capsule, Take 1 capsule by mouth every morning., Disp: , Rfl: ;  glipiZIDE (GLUCOTROL) 5 MG tablet, Take 5 mg by mouth daily before breakfast., Disp: , Rfl:  levothyroxine (SYNTHROID, LEVOTHROID) 50 MCG tablet, Take 50 mcg by mouth daily before breakfast., Disp: , Rfl: ;  Multiple Vitamin (MULTIVITAMIN WITH MINERALS) TABS tablet, Take 1 tablet by mouth daily., Disp: , Rfl:   Allergies: No Known Allergies  Social History: He works as a Insurance underwriter. He lives in [place]. He does not use tobacco. He drinks 3-4 beers per week. He received a red cell transfusion in February 2015. No risk factor for HIV or hepatitis.  ROS:   Positives include: Nausea and vomiting beginning one day prior to hospital admission in February 2015  A complete ROS was otherwise negative.  Physical Exam:  Blood pressure 155/74, pulse 113, temperature 97.6 F (36.4 C), temperature source Oral, resp. rate 18, height 5' 11"  (1.803 m), weight 199 lb 12.8 oz (90.629 kg), SpO2 99.00%.  HEENT: Oropharynx without visible mass, neck without mass Lungs: Clear bilaterally Cardiac: Regular rate and rhythm Abdomen: No hepatosplenomegaly, nontender, no mass. Healed midline incision. GU: Testes without mass, prominent left epididymis  Vascular: No leg edema Lymph nodes: No cervical, supra-clavicular, axillary, or inguinal nodes Neurologic: Alert and oriented, the motor exam appears intact in the  upper and lower extremities Skin: No rash Musculoskeletal: No spine tenderness   LAB:  CBC  Lab Results  Component Value Date   WBC 8.5 10/19/2013   HGB 8.8* 10/19/2013   HCT 26.6* 10/19/2013   MCV 73.5* 10/19/2013   PLT 384 10/19/2013   NEUTROABS 5.8 10/18/2013     CMP      Component Value Date/Time   NA 139 10/21/2013 0510   K 4.0 10/21/2013 0510   CL 104 10/21/2013 0510   CO2 19 10/21/2013 0510   GLUCOSE 189* 10/21/2013 0510   BUN 22 10/21/2013 0510   CREATININE 0.81 10/21/2013 0510   CALCIUM 9.0 10/21/2013 0510   PROT 5.8* 10/21/2013 0510   ALBUMIN 2.1* 10/21/2013 0510   AST 31 10/21/2013 0510   ALT 38 10/21/2013 0510   ALKPHOS 242* 10/21/2013 0510   BILITOT 0.3 10/21/2013 0510   GFRNONAA 90* 10/21/2013 0510   GFRAA >90 10/21/2013 0510   CEA on 10/13/2013-5.8   Radiology: As per history of present illness    Assessment/Plan:   1. Adenocarcinoma of  the small bowel, stage III (T3 N1), status post a partial small bowel resection 10/14/2013  The tumor returned microsatellite instability high with loss of MSH2 expression  2. Colon cancer X3, 1997 and 1999, status post an intra-abdominal colectomy with ileorectal anastomosis in May 1999  3. Iron deficiency anemia  4. Diabetes  5. Hypothyroidism  6. Hypertension  7. 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 Right retroperitoneal mass on the CT 10/12/2013   Disposition:   Dean Owens has a history of colon cancer on multiple occasions (we will request old records) and has now been diagnosed with a primary small bowel adenocarcinoma. 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 I reviewed the details of the surgical pathology report and adjuvant treatment options with Dean Owens and his wife.  He has a right retroperitoneal mass. The mass could represent metastatic small bowel carcinoma, colon cancer, prostate cancer, or another primary  tumor. His case was presented at the GI tumor conference. The plan is to proceed with a biopsy of the retroperitoneal mass prior to deciding on the indication for further surgery or systemic therapy. We will refer him to interventional radiology for the biopsy and he will return for an office visit next week.  Dean Owens will be referred to the genetics counselor. He will bring in records from his family member diagnosed with hereditary colon cancer in [place]. We will proceed with additional genetic testing as indicated. He understands his children and other family members should be tested for hereditary non-polyp colon cancer syndrome and undergo appropriate screening.   We will ask him to begin iron replacement.  McBee, [place]ell 11/12/2013, 6:05 PM

## 2013-11-15 ENCOUNTER — Ambulatory Visit (HOSPITAL_COMMUNITY)
Admission: RE | Admit: 2013-11-15 | Discharge: 2013-11-15 | Disposition: A | Discharge status: Home / Self Care | Payer: Medicare Other | Source: Ambulatory Visit | Attending: Oncology | Admitting: Oncology

## 2013-11-15 ENCOUNTER — Other Ambulatory Visit (HOSPITAL_COMMUNITY): Payer: Medicare Other

## 2013-11-15 ENCOUNTER — Telehealth: Payer: Self-pay | Admitting: *Deleted

## 2013-11-15 ENCOUNTER — Encounter (HOSPITAL_COMMUNITY): Payer: Self-pay

## 2013-11-15 DIAGNOSIS — R1900 Intra-abdominal and pelvic swelling, mass and lump, unspecified site: Secondary | ICD-10-CM

## 2013-11-15 DIAGNOSIS — I10 Essential (primary) hypertension: Secondary | ICD-10-CM | POA: Insufficient documentation

## 2013-11-15 DIAGNOSIS — M1710 Unilateral primary osteoarthritis, unspecified knee: Secondary | ICD-10-CM | POA: Insufficient documentation

## 2013-11-15 DIAGNOSIS — E119 Type 2 diabetes mellitus without complications: Secondary | ICD-10-CM | POA: Insufficient documentation

## 2013-11-15 DIAGNOSIS — C189 Malignant neoplasm of colon, unspecified: Secondary | ICD-10-CM

## 2013-11-15 DIAGNOSIS — Z85038 Personal history of other malignant neoplasm of large intestine: Secondary | ICD-10-CM | POA: Insufficient documentation

## 2013-11-15 DIAGNOSIS — Z79899 Other long term (current) drug therapy: Secondary | ICD-10-CM | POA: Insufficient documentation

## 2013-11-15 DIAGNOSIS — Z8546 Personal history of malignant neoplasm of prostate: Secondary | ICD-10-CM | POA: Insufficient documentation

## 2013-11-15 DIAGNOSIS — D490 Neoplasm of unspecified behavior of digestive system: Secondary | ICD-10-CM

## 2013-11-15 DIAGNOSIS — C480 Malignant neoplasm of retroperitoneum: Secondary | ICD-10-CM | POA: Insufficient documentation

## 2013-11-15 DIAGNOSIS — E039 Hypothyroidism, unspecified: Secondary | ICD-10-CM | POA: Insufficient documentation

## 2013-11-15 DIAGNOSIS — E785 Hyperlipidemia, unspecified: Secondary | ICD-10-CM | POA: Insufficient documentation

## 2013-11-15 DIAGNOSIS — R1903 Right lower quadrant abdominal swelling, mass and lump: Secondary | ICD-10-CM | POA: Insufficient documentation

## 2013-11-15 LAB — CBC
HCT: 30.6 % — ABNORMAL LOW (ref 39.0–52.0)
HEMOGLOBIN: 9.8 g/dL — AB (ref 13.0–17.0)
MCH: 23.1 pg — AB (ref 26.0–34.0)
MCHC: 32 g/dL (ref 30.0–36.0)
MCV: 72.2 fL — AB (ref 78.0–100.0)
Platelets: 448 10*3/uL — ABNORMAL HIGH (ref 150–400)
RBC: 4.24 MIL/uL (ref 4.22–5.81)
RDW: 21.3 % — AB (ref 11.5–15.5)
WBC: 11.7 10*3/uL — ABNORMAL HIGH (ref 4.0–10.5)

## 2013-11-15 LAB — BASIC METABOLIC PANEL
BUN: 11 mg/dL (ref 6–23)
CHLORIDE: 101 meq/L (ref 96–112)
CO2: 22 meq/L (ref 19–32)
CREATININE: 0.95 mg/dL (ref 0.50–1.35)
Calcium: 10.6 mg/dL — ABNORMAL HIGH (ref 8.4–10.5)
GFR calc Af Amer: >90 mL/min (ref >90)
GFR calc non Af Amer: 84 mL/min — ABNORMAL LOW (ref >90)
GLUCOSE: 133 mg/dL — AB (ref 70–99)
POTASSIUM: 4.7 meq/L (ref 3.7–5.3)
Sodium: 137 mEq/L (ref 137–147)

## 2013-11-15 LAB — APTT: aPTT: 36 seconds (ref 24–37)

## 2013-11-15 LAB — PROTIME-INR
INR: 1.08 (ref 0.00–1.49)
Prothrombin Time: 13.8 seconds (ref 11.6–15.2)

## 2013-11-15 MED ORDER — MIDAZOLAM HCL 2 MG/2ML IJ SOLN
INTRAMUSCULAR | Status: AC
  Filled 2013-11-15: qty 6

## 2013-11-15 MED ORDER — FENTANYL CITRATE 0.05 MG/ML IJ SOLN
INTRAMUSCULAR | Status: AC
  Filled 2013-11-15: qty 6

## 2013-11-15 MED ORDER — FENTANYL CITRATE 0.05 MG/ML IJ SOLN
INTRAMUSCULAR | Status: AC | PRN
  Administered 2013-11-15 (×2): 50 ug via INTRAVENOUS

## 2013-11-15 MED ORDER — MIDAZOLAM HCL 2 MG/2ML IJ SOLN
INTRAMUSCULAR | Status: AC | PRN
  Administered 2013-11-15: 1.5 mg via INTRAVENOUS
  Administered 2013-11-15: 0.5 mg via INTRAVENOUS

## 2013-11-15 MED ORDER — SODIUM CHLORIDE 0.9 % IV SOLN
INTRAVENOUS | Status: DC
  Administered 2013-11-15: 13:00:00 via INTRAVENOUS

## 2013-11-15 NOTE — Discharge Instructions (Signed)
Biopsy °Care After °Refer to this sheet in the next few weeks. These instructions provide you with information on caring for yourself after your procedure. Your caregiver may also give you more specific instructions. Your treatment has been planned according to current medical practices, but problems sometimes occur. Call your caregiver if you have any problems or questions after your procedure. °If you had a fine needle biopsy, you may have soreness at the biopsy site for 1 to 2 days. If you had an open biopsy, you may have soreness at the biopsy site for 3 to 4 days. °HOME CARE INSTRUCTIONS  °· You may resume normal diet and activities as directed. °· Change bandages (dressings) as directed. If your wound was closed with a skin glue (adhesive), it will wear off and begin to peel in 7 days. °· Only take over-the-counter or prescription medicines for pain, discomfort, or fever as directed by your caregiver. °· Ask your caregiver when you can bathe and get your wound wet. °SEEK IMMEDIATE MEDICAL CARE IF:  °· You have increased bleeding (more than a small spot) from the biopsy site. °· You notice redness, swelling, or increasing pain at the biopsy site. °· You have pus coming from the biopsy site. °· You have a fever. °· You notice a bad smell coming from the biopsy site or dressing. °· You have a rash, have difficulty breathing, or have any allergic problems. °MAKE SURE YOU:  °· Understand these instructions. °· Will watch your condition. °· Will get help right away if you are not doing well or get worse. °Document Released: 03/01/2005 Document Revised: 11/04/2011 Document Reviewed: 02/07/2011 °Exit[employer]® Patient Information ©2014 [employer].  ° °Conscious Sedation, Adult, Care After °Refer to this sheet in the next few weeks. These instructions provide you with information on caring for yourself after your procedure. Your health care provider may also give you more specific instructions. Your treatment has been  planned according to current medical practices, but problems sometimes occur. Call your health care provider if you have any problems or questions after your procedure. °WHAT TO EXPECT AFTER THE PROCEDURE  °After your procedure: °· You may feel sleepy, clumsy, and have poor balance for several hours. °· Vomiting may occur if you eat too soon after the procedure. °HOME CARE INSTRUCTIONS °· Do not participate in any activities where you could become injured for at least 24 hours. Do not: °· Drive. °· Swim. °· Ride a bicycle. °· Operate heavy machinery. °· Cook. °· Use power tools. °· Climb ladders. °· Work from a high place. °· Do not make important decisions or sign legal documents until you are improved. °· If you vomit, drink water, juice, or soup when you can drink without vomiting. Make sure you have little or no nausea before eating solid foods. °· Only take over-the-counter or prescription medicines for pain, discomfort, or fever as directed by your health care provider. °· Make sure you and your family fully understand everything about the medicines given to you, including what side effects may occur. °· You should not drink alcohol, take sleeping pills, or take medicines that cause drowsiness for at least 24 hours. °· If you smoke, do not smoke without supervision. °· If you are feeling better, you may resume normal activities 24 hours after you were sedated. °· Keep all appointments with your health care provider. °SEEK MEDICAL CARE IF: °· Your skin is pale or bluish in color. °· You continue to feel nauseous or vomit. °· Your   getting worse and is not helped by medicine.  You have bleeding or swelling.  You are still sleepy or feeling clumsy after 24 hours. SEEK IMMEDIATE MEDICAL CARE IF:  You develop a rash.  You have difficulty breathing.  You develop any type of allergic problem.  You have a fever. MAKE SURE YOU:  Understand these instructions.  Will watch your condition.  Will  get help right away if you are not doing well or get worse. Document Released: 06/02/2013 Document Reviewed: 03/19/2013 [facility] Patient Information 2014 [place], [place].

## 2013-11-15 NOTE — Telephone Encounter (Signed)
Per request of Dr. Benay Spice, spoke with wife by phone.  Dr. Benay Spice wants patient to take Ferrous Sulfate 325 mg twice a day by mouth for low iron.  Patient's wife verbalized understanding.  This RN encouraged her to go to pharmacy and speak with pharmacist re: brand of Iron to take and to answer any questions.  She verbalized comprehension.

## 2013-11-15 NOTE — Procedures (Signed)
Technically successful CT guided biopsy of abnormal soft tissue mass within the right lower abdominal quadrant.  No immediate post procedural complications.

## 2013-11-15 NOTE — Discharge Instructions (Signed)
Conscious Sedation, Adult, Care After Refer to this sheet in the next few weeks. These instructions provide you with information on caring for yourself after your procedure. Your health care provider may also give you more specific instructions. Your treatment has been planned according to current medical practices, but problems sometimes occur. Call your health care provider if you have any problems or questions after your procedure. WHAT TO EXPECT AFTER THE PROCEDURE  After your procedure:  You may feel sleepy, clumsy, and have poor balance for several hours.  Vomiting may occur if you eat too soon after the procedure. HOME CARE INSTRUCTIONS  Do not participate in any activities where you could become injured for at least 24 hours. Do not:  Drive.  Swim.  Ride a bicycle.  Operate heavy machinery.  Cook.  Use power tools.  Climb ladders.  Work from a high place.  Do not make important decisions or sign legal documents until you are improved.  If you vomit, drink water, juice, or soup when you can drink without vomiting. Make sure you have little or no nausea before eating solid foods.  Only take over-the-counter or prescription medicines for pain, discomfort, or fever as directed by your health care provider.  Make sure you and your family fully understand everything about the medicines given to you, including what side effects may occur.  You should not drink alcohol, take sleeping pills, or take medicines that cause drowsiness for at least 24 hours.  If you smoke, do not smoke without supervision.  If you are feeling better, you may resume normal activities 24 hours after you were sedated.  Keep all appointments with your health care provider. SEEK MEDICAL CARE IF:  Your skin is pale or bluish in color.  You continue to feel nauseous or vomit.  Your pain is getting worse and is not helped by medicine.  You have bleeding or swelling.  You are still sleepy or  feeling clumsy after 24 hours. SEEK IMMEDIATE MEDICAL CARE IF:  You develop a rash.  You have difficulty breathing.  You develop any type of allergic problem.  You have a fever. MAKE SURE YOU:  Understand these instructions.  Will watch your condition.  Will get help right away if you are not doing well or get worse. Document Released: 06/02/2013 Document Reviewed: 03/19/2013 [facility] Patient Information 2014 [place], [place].  Biopsy Care After These instructions give you information on caring for yourself after your procedure. Your doctor may also give you more specific instructions. Call your doctor if you have any problems or questions after your procedure. HOME CARE   Return to your normal diet and activities as told by your doctor.  Change your bandages (dressings) as told by your doctor. If skin glue (adhesive) was used, it will peel off in 7 days.  Only take medicines as told by your doctor.  Ask your doctor when you can bathe and get your wound wet. GET HELP RIGHT AWAY IF:  You see more than a small spot of blood coming from the wound.  You have redness, puffiness (swelling), or pain.  You see yellowish-white fluid (pus) coming from the wound.  You have a fever.  You notice a bad smell coming from the wound or bandage.  You have a rash, trouble breathing, or any allergy problems. MAKE SURE YOU:   Understand these instructions.  Will watch your condition.  Will get help right away if you are not doing well or get worse. Document Released:  04/16/2011 Document Revised: 11/04/2011 Document Reviewed: 04/16/2011 [facility] Patient Information 2014 [place], [place].

## 2013-11-15 NOTE — H&P (Signed)
Chief Complaint: "I'm here for a biopsy" Referring Physician:Sherrill HPI: RIDER ERMIS is an 68 y.o. male with prior history of prostate and colon cancer. He recently underwent capsule endoscopy which found a small bowel lesion. This was confirmed by CT and the pt underwent SB resection with the pathology being small bowel adenocarcinoma. He has recovered well from that. The CT scan also showed a right retroperitoneal mass of uncertain etiology. IR is requested to perform biopsy of this lesion. PMHx and meds reviewed. Recovering well from Ex lap, SBR. Tol reg diet and energy/strength improving. He denies any post-op issues, recent fevers, chills, illness.  Past Medical History:  Past Medical History  Diagnosis Date  . Hypertension   . Hyperlipidemia   . Type 2 diabetes mellitus   . Hypothyroidism   . History of colon cancer NO RECURRENCE    S/P COLECTOMY X3  LAST ONE 1998--  [place]  . Recurrent prostate carcinoma FOLLOWED BY DR Gaynelle Arabian    DX 2007  S/P RADIOACTIVE SEED IMPLANTS  . ED (erectile dysfunction) of organic origin   . Right knee DJD   . Eczema   . OA (osteoarthritis)     LEFT KNEE  . Wears glasses   . History of iron deficiency anemia   . History of squamous cell carcinoma excision   . Accelerated hypertension 10/21/2013  . Type II diabetes mellitus 10/21/2013  . Unspecified hypothyroidism 10/21/2013  . Ileus, postoperative 10/21/2013    Past Surgical History:  Past Surgical History  Procedure Laterality Date  . Givens capsule study N/A 07/05/2013    Procedure: GIVENS CAPSULE STUDY;  Surgeon: [name], MD;  Location: [facility];  Service: Endoscopy;  Laterality: N/A;  . Radioactive prostate seed implants  OCT 2007  . Knee arthroscopy Bilateral 2006 &  2007  . Tonsillectomy  AS CHILD  . Appendectomy  AGE 28  . Cryoablation N/A 10/01/2013    Procedure: CRYO ABLATION PROSTATE;  Surgeon: [name], MD;  Location: [facility];  Service: Urology;  Laterality: N/A;  . Prostate surgery    . Right colectomy  09/07/1995  . Sigmoidectomy  11/13/95  . Total colectomy  12/29/1997    ileorectal anastomosis  . Laparotomy N/A 10/14/2013    Procedure: EXPLORATORY LAPAROTOMY;  Surgeon: [name], MD;  Location: [facility];  Service: General;  Laterality: N/A;  . Bowel resection N/A 10/14/2013    Procedure: SMALL BOWEL RESECTION;  Surgeon: [name], MD;  Location: [facility];  Service: General;  Laterality: N/A;  . Lysis of adhesion N/A 10/14/2013    Procedure: LYSIS OF ADHESION;  Surgeon: [name], MD;  Location: [facility];  Service: General;  Laterality: N/A;    Family History: No family history on file.  Social History:  reports that he quit smoking about 15 years ago. His smoking use included Cigarettes. He has a 7.5 pack-year smoking history. He has never used smokeless tobacco. He reports that he drinks alcohol. He reports that he does not use illicit drugs.  Allergies: No Known Allergies  Medications:   Medication List    ASK your doctor about these medications       acetaminophen 325 MG tablet  Commonly known as:  TYLENOL  Take 2 tablets (650 mg total) by mouth every 6 (six) hours as needed for mild pain, moderate pain, fever or headache.     amLODipine-benazepril 5-10 MG per capsule  Commonly known as:  LOTREL  Take 1 capsule by mouth every morning.     glipiZIDE 5 MG tablet  Commonly known as:  GLUCOTROL  Take 5 mg by mouth daily before breakfast.     levothyroxine 50 MCG tablet  Commonly known as:  SYNTHROID, LEVOTHROID  Take 50 mcg by mouth daily before breakfast.     multivitamin with minerals Tabs tablet  Take 1 tablet by mouth daily.        Please HPI for pertinent positives, otherwise complete 10 system ROS negative.  Physical Exam: BP 135/61  Pulse 82  Temp(Src) 97.5 F (36.4 C) (Oral)  Resp 18  SpO2 100% There is no weight on file to calculate BMI.   General  Appearance:  Alert, cooperative, no distress, appears stated age  Head:  Normocephalic, without obvious abnormality, atraumatic  ENT: Unremarkable  Neck: Supple, symmetrical, trachea midline  Lungs:   Clear to auscultation bilaterally, no w/r/r, respirations unlabored without use of accessory muscles.  Chest Wall:  No tenderness or deformity  Heart:  Regular rate and rhythm, S1, S2 normal, no murmur, rub or gallop.  Abdomen:   Soft, non-tender, non distended. Well healed midline scar, no signs of infection.  Neurologic: Normal affect, no gross deficits.   Results for orders placed during the hospital encounter of 11/15/13 (from the past 48 hour(s))  APTT     Status: None   Collection Time    11/15/13 12:59 PM      Result Value Ref Range   aPTT 36  24 - 37 seconds  BASIC METABOLIC PANEL     Status: Abnormal   Collection Time    11/15/13 12:59 PM      Result Value Ref Range   Sodium 137  137 - 147 mEq/L   Potassium 4.7  3.7 - 5.3 mEq/L   Chloride 101  96 - 112 mEq/L   CO2 22  19 - 32 mEq/L   Glucose, Bld 133 (*) 70 - 99 mg/dL   BUN 11  6 - 23 mg/dL   Creatinine, Ser 0.95  0.50 - 1.35 mg/dL   Calcium 10.6 (*) 8.4 - 10.5 mg/dL   GFR calc non Af Amer 84 (*) >90 mL/min   GFR calc Af Amer >90  >90 mL/min   Comment: (NOTE)     The eGFR has been calculated using the CKD EPI equation.     This calculation has not been validated in all clinical situations.     eGFR's persistently <90 mL/min signify possible Chronic Kidney     Disease.  CBC     Status: Abnormal   Collection Time    11/15/13 12:59 PM      Result Value Ref Range   WBC 11.7 (*) 4.0 - 10.5 K/uL   RBC 4.24  4.22 - 5.81 MIL/uL   Hemoglobin 9.8 (*) 13.0 - 17.0 g/dL   HCT 30.6 (*) 39.0 - 52.0 %   MCV 72.2 (*) 78.0 - 100.0 fL   MCH 23.1 (*) 26.0 - 34.0 pg   MCHC 32.0  30.0 - 36.0 g/dL   RDW 21.3 (*) 11.5 - 15.5 %   Platelets 448 (*) 150 - 400 K/uL  PROTIME-INR     Status: None   Collection Time    11/15/13 12:59 PM       Result Value Ref Range   Prothrombin Time 13.8  11.6 - 15.2 seconds   INR 1.08  0.00 - 1.49   No results found.  Assessment/Plan Right retroperitoneal  mass in pt with history of prostate, colon, and small bowel cancer. Discussed CT guided biopsy of this lesion. Explained procedure, risks, complications, use of sedation. Labs reviewed, ok Consent signed in chart  Ascencion Dike PA-C 11/15/2013, 2:05 PM

## 2013-11-16 ENCOUNTER — Ambulatory Visit: Payer: Medicare Other | Admitting: Nutrition

## 2013-11-16 ENCOUNTER — Other Ambulatory Visit (HOSPITAL_BASED_OUTPATIENT_CLINIC_OR_DEPARTMENT_OTHER): Payer: Medicare Other

## 2013-11-16 ENCOUNTER — Other Ambulatory Visit: Payer: Self-pay | Admitting: *Deleted

## 2013-11-16 DIAGNOSIS — D509 Iron deficiency anemia, unspecified: Secondary | ICD-10-CM

## 2013-11-16 DIAGNOSIS — C189 Malignant neoplasm of colon, unspecified: Secondary | ICD-10-CM

## 2013-11-16 DIAGNOSIS — R1900 Intra-abdominal and pelvic swelling, mass and lump, unspecified site: Secondary | ICD-10-CM

## 2013-11-16 DIAGNOSIS — D490 Neoplasm of unspecified behavior of digestive system: Secondary | ICD-10-CM

## 2013-11-16 DIAGNOSIS — C179 Malignant neoplasm of small intestine, unspecified: Secondary | ICD-10-CM

## 2013-11-16 LAB — COMPREHENSIVE METABOLIC PANEL (CC13)
ALBUMIN: 3.2 g/dL — AB (ref 3.5–5.0)
ALT: 24 U/L (ref 0–55)
AST: 20 U/L (ref 5–34)
Alkaline Phosphatase: 152 U/L — ABNORMAL HIGH (ref 40–150)
Anion Gap: 10 mEq/L (ref 3–11)
BILIRUBIN TOTAL: 0.33 mg/dL (ref 0.20–1.20)
BUN: 10.5 mg/dL (ref 7.0–26.0)
CO2: 21 meq/L — AB (ref 22–29)
Calcium: 10.6 mg/dL — ABNORMAL HIGH (ref 8.4–10.4)
Chloride: 109 mEq/L (ref 98–109)
Creatinine: 0.9 mg/dL (ref 0.7–1.3)
Glucose: 148 mg/dl — ABNORMAL HIGH (ref 70–140)
POTASSIUM: 4.8 meq/L (ref 3.5–5.1)
SODIUM: 140 meq/L (ref 136–145)
TOTAL PROTEIN: 7.3 g/dL (ref 6.4–8.3)

## 2013-11-16 LAB — CBC WITH DIFFERENTIAL/PLATELET
BASO%: 0.6 % (ref 0.0–2.0)
Basophils Absolute: 0.1 10*3/uL (ref 0.0–0.1)
EOS%: 2.5 % (ref 0.0–7.0)
Eosinophils Absolute: 0.3 10*3/uL (ref 0.0–0.5)
HCT: 31.8 % — ABNORMAL LOW (ref 38.4–49.9)
HGB: 9.9 g/dL — ABNORMAL LOW (ref 13.0–17.1)
LYMPH%: 10.1 % — AB (ref 14.0–49.0)
MCH: 22.7 pg — ABNORMAL LOW (ref 27.2–33.4)
MCHC: 31.2 g/dL — AB (ref 32.0–36.0)
MCV: 72.9 fL — ABNORMAL LOW (ref 79.3–98.0)
MONO#: 1.1 10*3/uL — AB (ref 0.1–0.9)
MONO%: 8.2 % (ref 0.0–14.0)
NEUT#: 10.7 10*3/uL — ABNORMAL HIGH (ref 1.5–6.5)
NEUT%: 78.6 % — ABNORMAL HIGH (ref 39.0–75.0)
Platelets: 417 10*3/uL — ABNORMAL HIGH (ref 140–400)
RBC: 4.36 10*6/uL (ref 4.20–5.82)
RDW: 25.7 % — AB (ref 11.0–14.6)
WBC: 13.6 10*3/uL — ABNORMAL HIGH (ref 4.0–10.3)
lymph#: 1.4 10*3/uL (ref 0.9–3.3)

## 2013-11-16 NOTE — Progress Notes (Signed)
68 year-old male diagnosed with small bowel carcinoma.  He is a patient of Dr. Benay Spice.  Past medical history includes hypertension, hyperlipidemia, diabetes, hypothyroidism, osteoarthritis, prostate cancer diagnosed in 2007, colon cancer diagnosed in 1997, postop ileus.  Medications include Glucotrol, Synthroid, multivitamin.  Labs include glucose of 133, March 23.  Height: 5 feet 11 inches. Weight: 203.4 pounds March 24. Usual body weight: 225 pounds per patient.  Patient weight documented as 220 pounds November 2014. BMI: 28.37.  Patient reports 20 pound weight loss since hospitalization.  This is an 8% weight loss in 4 months.  He is awaiting treatment plan.  Patient reports he is drinking one ensure daily mixed with Chocolate milk and tolerates well.  He doesn't particularly like the taste of ensure.  Patient and wife requesting information on high iron foods.  Patient concerned about potential taste alterations.  Nutrition Focused Physical Exam reveals mild depletion of muscle and fat losses.  Patient meets criteria for moderate malnutrition in the context of acute illness.  Nutrition diagnosis: Food and nutrition related knowledge related to small bowel carcinoma as evidenced by no need for nutrition related information.  Intervention: I educated them on the importance of small, frequent meals and snacks with adequate protein, and calories for weight maintenance. Patient also educated on strategies for improving taste.  Also reviewed strategies for increasing iron containing foods and absorption.  Provided fact sheets for patient.  Also provided samples.  Questions were answered.  Monitoring, Evaluation, Goals:  Patient will tolerate adequate calories and protein for weight maintenance throughout treatment.  Next visit: Patient will contact me with questions or concerns.

## 2013-11-17 ENCOUNTER — Encounter: Payer: Self-pay | Admitting: *Deleted

## 2013-11-17 LAB — CEA: CEA: 2.6 ng/mL (ref 0.0–5.0)

## 2013-11-17 NOTE — Progress Notes (Signed)
Copy of Advanced Directive forwarded to HIM to be scanned.

## 2013-11-19 ENCOUNTER — Ambulatory Visit (HOSPITAL_BASED_OUTPATIENT_CLINIC_OR_DEPARTMENT_OTHER): Payer: Medicare Other | Admitting: Oncology

## 2013-11-19 VITALS — BP 142/69 | HR 89 | Temp 97.6°F | Resp 18 | Ht 71.0 in | Wt 205.7 lb

## 2013-11-19 DIAGNOSIS — I10 Essential (primary) hypertension: Secondary | ICD-10-CM

## 2013-11-19 DIAGNOSIS — Z1509 Genetic susceptibility to other malignant neoplasm: Secondary | ICD-10-CM

## 2013-11-19 DIAGNOSIS — E119 Type 2 diabetes mellitus without complications: Secondary | ICD-10-CM

## 2013-11-19 DIAGNOSIS — D509 Iron deficiency anemia, unspecified: Secondary | ICD-10-CM

## 2013-11-19 DIAGNOSIS — C179 Malignant neoplasm of small intestine, unspecified: Secondary | ICD-10-CM

## 2013-11-19 DIAGNOSIS — Z800 Family history of malignant neoplasm of digestive organs: Secondary | ICD-10-CM

## 2013-11-19 DIAGNOSIS — D649 Anemia, unspecified: Secondary | ICD-10-CM

## 2013-11-19 NOTE — Progress Notes (Signed)
  [facility] OFFICE PROGRESS NOTE   Diagnosis: Hereditary non-polyposis colon cancer syndrome  INTERVAL HISTORY:   Dean Owens returns as scheduled. He underwent a CT-guided biopsy of the right retroperitoneal mass on 11/15/2013. The pathology returned as a spindle cell neoplasm consistent with sarcoma.  He began iron a few days ago. His chief complaint is bilateral knee pain.  Objective:  Vital signs in last 24 hours:  Blood pressure 142/69, pulse 89, temperature 97.6 F (36.4 C), temperature source Oral, resp. rate 18, height 5' 11" (1.803 m), weight 205 lb 11.2 oz (93.305 kg), SpO2 100.00%.    GI: The midline wound has healed. No mass. Nontender.   Lab Results:  Lab Results  Component Value Date   WBC 13.6* 11/16/2013   HGB 9.9* 11/16/2013   HCT 31.8* 11/16/2013   MCV 72.9* 11/16/2013   PLT 417* 11/16/2013   NEUTROABS 10.7* 11/16/2013    Lab Results  Component Value Date   CEA 2.6 11/16/2013    Imaging:  No results found.  Medications: I have reviewed the patient's current medications.  Assessment/Plan: 1. 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 Colon cancer X3, 1997 and 1999, status post an intra-abdominal colectomy with ileorectal anastomosis in May 1999  3. Iron deficiency anemia  4. Diabetes  5. Hypothyroidism  6. Hypertension  7. Family history of colon cancer-family members with positive testing for a MSH2 mutation (S743X) 61. Right retroperitoneal mass on the CT 10/12/2013 -status post a CT-guided biopsy 11/15/2013 confirming a "sarcoma "    Disposition:  Dean Owens has hereditary non-polyposis colon cancer syndrome. I am not aware an association of this syndrome with sarcoma. We will investigate this further.  I discussed the sarcoma diagnosis and treatment options with Dean Owens  and his wife. His case was presented at the GI tumor conference on 11/17/2013. The tumor appears resectable. He will be referred to Dr. Barry Dienes for resection of the right retroperitoneal mass. Radiation oncology will be consulted based on the final pathology and margins.  We discussed the indication for adjuvant therapy for the small bowel carcinoma. He has a history of 3 previous gastrointestinal malignancies that have not recurred. The stage for stage prognosis is better for patients with colon cancer and HNPCC. There is no data to confirm a benefit for adjuvant chemotherapy in patients with resected small bowel cancer. The GI tumor conference recommendation is to not recommend adjuvant chemotherapy. It is felt the sarcoma is more likely a life limiting diagnosis.  Dean Owens will continue iron. He will return for a repeat CBC in approximately one month. He will be scheduled for an office visit in 2 months. He has been scheduled to see the genetics counselor. We will arrange for MSH2 mutation testing.  Betsy Coder, MD  11/19/2013  2:07 PM

## 2013-11-22 ENCOUNTER — Other Ambulatory Visit: Payer: Self-pay | Admitting: Dermatology

## 2013-11-23 ENCOUNTER — Ambulatory Visit (HOSPITAL_BASED_OUTPATIENT_CLINIC_OR_DEPARTMENT_OTHER): Payer: Medicare Other | Admitting: Genetic Counselor

## 2013-11-23 ENCOUNTER — Other Ambulatory Visit: Payer: Medicare Other

## 2013-11-23 ENCOUNTER — Telehealth: Payer: Self-pay | Admitting: Oncology

## 2013-11-23 DIAGNOSIS — C189 Malignant neoplasm of colon, unspecified: Secondary | ICD-10-CM

## 2013-11-23 DIAGNOSIS — Z8049 Family history of malignant neoplasm of other genital organs: Secondary | ICD-10-CM

## 2013-11-23 DIAGNOSIS — C179 Malignant neoplasm of small intestine, unspecified: Secondary | ICD-10-CM

## 2013-11-23 DIAGNOSIS — Z803 Family history of malignant neoplasm of breast: Secondary | ICD-10-CM

## 2013-11-23 DIAGNOSIS — IMO0002 Reserved for concepts with insufficient information to code with codable children: Secondary | ICD-10-CM

## 2013-11-23 DIAGNOSIS — D490 Neoplasm of unspecified behavior of digestive system: Secondary | ICD-10-CM

## 2013-11-23 DIAGNOSIS — Z1509 Genetic susceptibility to other malignant neoplasm: Secondary | ICD-10-CM

## 2013-11-23 DIAGNOSIS — Z8041 Family history of malignant neoplasm of ovary: Secondary | ICD-10-CM

## 2013-11-23 DIAGNOSIS — Z800 Family history of malignant neoplasm of digestive organs: Secondary | ICD-10-CM

## 2013-11-23 NOTE — Telephone Encounter (Signed)
s/w pt re appts for 4/17 and 5/21. appt w/surgeon already on schedule. per pt he has been contacted by [facility].

## 2013-11-24 ENCOUNTER — Encounter: Payer: Self-pay | Admitting: Genetic Counselor

## 2013-11-24 ENCOUNTER — Encounter (INDEPENDENT_AMBULATORY_CARE_PROVIDER_SITE_OTHER): Payer: Self-pay

## 2013-11-24 DIAGNOSIS — Z1509 Genetic susceptibility to other malignant neoplasm: Secondary | ICD-10-CM | POA: Insufficient documentation

## 2013-11-24 NOTE — Progress Notes (Signed)
Dr.  Betsy Coder requested a consultation for genetic counseling and risk assessment for Dean Owens, a 68 y.o. male, for discussion of his personal history of colon cancer and family history of colon, uterine, breast and sebaceous adenoma's.  He presents to clinic today to discuss the possibility of a genetic predisposition to cancer, and to further clarify his risks, as well as his family members' risks for cancer.   HISTORY OF PRESENT ILLNESS: In 1997, at the age of 33, LESLIE LANGILLE was diagnosed with colon cancer. This was treated with surgery.  He was found to have colon cancer again in 1999 when he had a colectomy.  In 2015, he was found to have small bowel cancer.  The tumor was tested and found to be MSI-H and IHC found MSH2 loss of expression.  Mr. Ricci brought in records on his family history that confirm an MSH2 mutation called [phone], performed at [facility].  This confirms his diagnosis of Lynch syndrome.      Past Medical History  Diagnosis Date  . Hypertension   . Hyperlipidemia   . Type 2 diabetes mellitus   . Hypothyroidism   . History of colon cancer NO RECURRENCE    S/P COLECTOMY X3  LAST ONE 1998--  [place]  . Recurrent prostate carcinoma FOLLOWED BY DR Gaynelle Arabian    DX 2007  S/P RADIOACTIVE SEED IMPLANTS  . ED (erectile dysfunction) of organic origin   . Right knee DJD   . Eczema   . OA (osteoarthritis)     LEFT KNEE  . Wears glasses   . History of iron deficiency anemia   . History of squamous cell carcinoma excision   . Accelerated hypertension 10/21/2013  . Type II diabetes mellitus 10/21/2013  . Unspecified hypothyroidism 10/21/2013  . Ileus, postoperative 10/21/2013  . 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 Lynch syndrome     Past Surgical History  Procedure Laterality Date  . Givens capsule study N/A 07/05/2013    Procedure: GIVENS CAPSULE STUDY;  Surgeon: [name], MD;  Location: [facility];   Service: Endoscopy;  Laterality: N/A;  . Radioactive prostate seed implants  OCT 2007  . Knee arthroscopy Bilateral 2006 &  2007  . Tonsillectomy  AS CHILD  . Appendectomy  AGE 49  . Cryoablation N/A 10/01/2013    Procedure: CRYO ABLATION PROSTATE;  Surgeon: [name], MD;  Location: [facility];  Service: Urology;  Laterality: N/A;  . Prostate surgery    . Right colectomy  09/07/1995  . Sigmoidectomy  11/13/95  . Total colectomy  12/29/1997    ileorectal anastomosis  . Laparotomy N/A 10/14/2013    Procedure: EXPLORATORY LAPAROTOMY;  Surgeon: [name], MD;  Location: [facility];  Service: General;  Laterality: N/A;  . Bowel resection N/A 10/14/2013    Procedure: SMALL BOWEL RESECTION;  Surgeon: [name], MD;  Location: [facility];  Service: General;  Laterality: N/A;  . Lysis of adhesion N/A 10/14/2013    Procedure: LYSIS OF ADHESION;  Surgeon: [name], MD;  Location: [facility];  Service: General;  Laterality: N/A;    History   Social History  . Marital Status: Married    Spouse Name: Jenny Reichmann    Number of Children: 3  . Years of Education: N/A   Occupational History  .     Social History Main Topics  . Smoking status: Former Smoker --  0.50 packs/day for 15 years    Types: Cigarettes    Quit date: 09/24/1998  . Smokeless tobacco: Never Used  . Alcohol Use: Yes     Comment: OCCASIONAL  . Drug Use: No  . Sexual Activity: Yes   Other Topics Concern  . None   Social History Narrative   Married, wife Caren Griffins for 40+ years   Mortgage industry-employed by [employer]   # 3 children-#2 girls and #1 boy   #4 grandchildren (boys)   No pets or hobbies (used to play golf)    FAMILY HISTORY:  We obtained a detailed, 4-generation family history.  Significant diagnoses are listed below: Family History  Problem Relation Age of Onset  . Heart failure Mother   . Heart failure Father   . Ovarian cancer Maternal Aunt   . Uterine cancer Paternal Aunt 71  . Colon  cancer Paternal Aunt 87  . Cancer Paternal Aunt 41    sebaceous adenoma; Muir-Torre syndrome  . Colon cancer Paternal Grandmother   . Colon cancer Brother 54  . Colon cancer Cousin 14  . Uterine cancer Cousin 15  . Colon polyps Cousin   . Brain cancer Cousin 17    astrocytoma; Mary's daughter  . Colon polyps Cousin   . Lung cancer Cousin 54  . Breast cancer Cousin 47  . Uterine cancer Cousin 74  . Colon cancer Cousin 64   Patient's maternal ancestors are of [place] and [place] descent, and paternal ancestors are of [place] descent. There is no reported Ashkenazi Jewish ancestry. There is no known consanguinity.  GENETIC COUNSELING ASSESSMENT: THERESA DOHRMAN is a 68 y.o. male with a personal history of 3 colon cancers and prostate cancer and family history of breast, uterine, colon cancer and MSH2 mutation which somewhat suggestive of a Lynch syndrome and predisposition to cancer. We, therefore, discussed and recommended the following at today's visit.   DISCUSSION: We reviewed the characteristics, features and inheritance patterns of hereditary cancer syndromes. We also discussed genetic testing, including the appropriate family members to test, the process of testing, insurance coverage and turn-around-time for results. We reviewed his tumor testing that suggested a mutation within the MSH2 gene.  Based on his family history of Lynch related cancers, and known mutation within the MSH2 gene, suggests that he too has Lynch syndrome.  We reviewed the NCCN guidelines for risks for cancers based on an MSH2 gene, including colon, uterine/ovarian, gastric, bladder/ureter, pancreatic, small bowel and others.  We discussed the NCCN guidelines for screening for these cancers as well.  Lynch syndrome is a dominant inherited condition, therefore each of his children have a 50% risk of having this mutation.  We also discussed that his brother who passed away from colon cancer had a daughter who is at risk  for having a mutation as well.  Family history also has a maternal aunt with ovarian cancer.  No other cancers on the maternal side of the family.  Briefly discussed increased risk for cancer syndromes based on this history.  Patient is not interested in testing for Lynch or other cancers.  Feels that he has enough information to indicate that he has this mutation.  I have to agree with him, that his personal history of cancer, tumor testing, and known family mutation in the MSH2 gene are all suggestive that he has Donnal Debar based on this family mutation and should be followed accordingly.  Mr. Wrage sees Dr. Wynetta Emery at [facility].  He states that  Dr. Wynetta Emery knows of his history and family mutation and will follow him for Lynch syndrome.  PLAN: After considering the risks, benefits, and limitations, KYLYN SOOKRAM declined testing for Lynch syndrome. He should be followed according to the Lynch syndrome protocol for extracolonic cancers associated with Lynch syndrome. We encouraged GEARL BARATTA to remain in contact with cancer genetics annually so that we can continuously update the family history and inform him of any changes in cancer genetics and testing that may be of benefit for his family. Astrid Divine Ledbetter's questions were answered to his satisfaction today. Our contact information was provided should additional questions or concerns arise.  The patient was seen for a total of 60 minutes, greater than 50% of which was spent face-to-face counseling.  This note will also be sent to the referring provider via the electronic medical record. The patient will be supplied with a summary of this genetic counseling discussion as well as educational information on the discussed hereditary cancer syndromes following the conclusion of their visit.   Patient was discussed with Dr. Marcy Panning.   _______________________________________________________________________ For Office Staff:   Number of people involved in session: 2 Was an Intern/ student involved with case: no

## 2013-11-29 ENCOUNTER — Other Ambulatory Visit: Payer: Self-pay | Admitting: Urology

## 2013-11-29 ENCOUNTER — Ambulatory Visit (INDEPENDENT_AMBULATORY_CARE_PROVIDER_SITE_OTHER): Payer: Medicare Other | Admitting: General Surgery

## 2013-11-29 ENCOUNTER — Encounter (INDEPENDENT_AMBULATORY_CARE_PROVIDER_SITE_OTHER): Payer: Self-pay | Admitting: General Surgery

## 2013-11-29 VITALS — BP 112/74 | HR 77 | Temp 97.5°F | Resp 16 | Ht 71.0 in | Wt 207.0 lb

## 2013-11-29 DIAGNOSIS — C480 Malignant neoplasm of retroperitoneum: Secondary | ICD-10-CM

## 2013-11-29 NOTE — Patient Instructions (Addendum)
We will plan to do a right lower quadrant incision.  Main risks are bleeding, infection, damage to adjacent structures.  We will get urology to place a right ureteral stent.  Stop curamin.

## 2013-11-29 NOTE — Assessment & Plan Note (Addendum)
Patient has a history of Lynch syndrome and a recent small bowel adenocarcinoma in addition to the new sarcoma.   The retroperitoneal sarcoma will need to be resected. It does not appear to involve any adjacent structures, but does have close proximity to the ureter. I will ask urology to place a ureteral stent prior to resection.   We will plan to do a right lower quadrant incision and try to do retroperitoneal approach.  This may be difficult since he has had prior right colectomy and retroperitoneum is likely violated.  His previous midline incision took quite a bit of time to heal, and he has had 4 surgeries via this incision.    I discussed the risks of bleeding, infection, hernia, damage to adjacent structures, possible need to take out additional structures if adherent to mass, and risk of general complications like VTE, cardiac or pulmonary issues, urinary retention, etc.    I discussed that we would not do a bowel prep.   I also reviewed that he may need additional treatment like radiation if he has high grade tumor or close/positive margins.    30 min spent in evaluation, examination, coordination of care and counseling.  >50% of time spent in counseling.

## 2013-11-30 ENCOUNTER — Encounter (HOSPITAL_COMMUNITY): Payer: Self-pay | Admitting: Pharmacy Technician

## 2013-12-01 NOTE — Progress Notes (Signed)
Chief Complaint  Patient presents with  . 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 This was discovered when he came to the hospital for a bowel obstruction. 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 His preoperative CT scan as well as his intraoperative exploration did demonstrate a large mass in the retroperitoneum. This was not his primary problem and he did have electrolyte issues from his bowel obstruction so an extensive resection of this mass was not performed.  A core needle biopsy of this did demonstrate a sarcoma. He is not having any significant right-sided pain. He denies any right leg swelling. He has had resolution of his bowel symptoms since his exploratory laparotomy and small bowel resection with anastomosis. He denies nausea, vomiting, diarrhea, or constipation.  Of note, he has had 4 surgeries for Lynch syndrome. He underwent 3 colonic resections and now has an ileorectal anastomosis. He then had the small bowel resection.  He also has a history of prostate cancer.  Past Medical History  Diagnosis Date  . Hypertension   . Hyperlipidemia   . Type 2 diabetes mellitus   . Hypothyroidism   . History of colon cancer NO RECURRENCE    S/P COLECTOMY X3  LAST ONE 1998--  [place]  . Recurrent prostate carcinoma FOLLOWED BY DR Gaynelle Arabian    DX 2007  S/P RADIOACTIVE SEED IMPLANTS  . ED (erectile dysfunction) of organic origin   . Right knee DJD   . Eczema   . OA (osteoarthritis)     LEFT KNEE  . Wears glasses   . History of iron deficiency anemia   . History of squamous cell carcinoma excision   . Accelerated hypertension 10/21/2013  . Type II diabetes mellitus 10/21/2013  . Unspecified hypothyroidism 10/21/2013  . Ileus, postoperative 10/21/2013  . 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 Lynch syndrome     Past  Surgical History  Procedure Laterality Date  . Givens capsule study N/A 07/05/2013    Procedure: GIVENS CAPSULE STUDY;  Surgeon: [name], MD;  Location: [facility];  Service: Endoscopy;  Laterality: N/A;  . Radioactive prostate seed implants  OCT 2007  . Knee arthroscopy Bilateral 2006 &  2007  . Tonsillectomy  AS CHILD  . Appendectomy  AGE 59  . Cryoablation N/A 10/01/2013    Procedure: CRYO ABLATION PROSTATE;  Surgeon: [name], MD;  Location: [facility];  Service: Urology;  Laterality: N/A;  . Prostate surgery    . Right colectomy  09/07/1995  . Sigmoidectomy  11/13/95  . Total colectomy  12/29/1997    ileorectal anastomosis  . Laparotomy N/A 10/14/2013    Procedure: EXPLORATORY LAPAROTOMY;  Surgeon: [name], MD;  Location: [place];  Service: General;  Laterality: N/A;  . Bowel resection N/A 10/14/2013    Procedure: SMALL BOWEL RESECTION;  Surgeon: [name], MD;  Location: [place];  Service: General;  Laterality: N/A;  . Lysis of adhesion N/A 10/14/2013    Procedure: LYSIS OF ADHESION;  Surgeon: [name], MD;  Location: [place];  Service: General;  Laterality: N/A;    Current Outpatient Prescriptions  Medication Sig Dispense Refill  . acetaminophen (TYLENOL) 325 MG tablet Take 2 tablets (650 mg total) by mouth every 6 (six) hours as needed for  mild pain, moderate pain, fever or headache.      [name] amLODipine-benazepril (LOTREL) 5-10 MG per capsule Take 1 capsule by mouth every morning.      . ferrous sulfate 325 (65 FE) MG tablet Take 325 mg by mouth 2 (two) times daily with a meal.      . glipiZIDE (GLUCOTROL) 5 MG tablet Take 5 mg by mouth daily before breakfast.      . levothyroxine (SYNTHROID, LEVOTHROID) 50 MCG tablet Take 50 mcg by mouth daily before breakfast.      . Multiple Vitamin (MULTIVITAMIN WITH MINERALS) TABS tablet Take 1 tablet by mouth daily.      . diphenoxylate-atropine (LOMOTIL) 2.5-0.025 MG per tablet Take 1 tablet by mouth  4 (four) times daily as needed for diarrhea or loose stools.       No current facility-administered medications for this visit.     No Known Allergies   Family History  Problem Relation Age of Onset  . Heart failure Mother   . Heart failure Father   . Ovarian cancer Maternal Aunt   . Uterine cancer Paternal Aunt 71  . Colon cancer Paternal Aunt 87  . Cancer Paternal Aunt 66    sebaceous adenoma; Muir-Torre syndrome  . Colon cancer Paternal Grandmother   . Colon cancer Brother 38  . Colon cancer Cousin 59  . Uterine cancer Cousin 51  . Colon polyps Cousin   . Brain cancer Cousin 17    astrocytoma; Mary's daughter  . Colon polyps Cousin   . Lung cancer Cousin 51  . Breast cancer Cousin 96  . Uterine cancer Cousin 76  . Colon cancer Cousin 62     History   Social History  . Marital Status: Married    Spouse Name: Jenny Reichmann    Number of Children: 3  . Years of Education: N/A   Occupational History  .     Social History Main Topics  . Smoking status: Former Smoker -- 0.50 packs/day for 15 years    Types: Cigarettes    Quit date: 09/24/1998  . Smokeless tobacco: Never Used  . Alcohol Use: Yes     Comment: OCCASIONAL  . Drug Use: No  . Sexual Activity: Yes   Other Topics Concern  . None   Social History Narrative   Married, wife Caren Griffins for 40+ years   Mortgage industry-employed by [employer]   # 3 children-#2 girls and #1 boy   #4 grandchildren (boys)   No pets or hobbies (used to play golf)     REVIEW OF SYSTEMS - PERTINENT POSITIVES ONLY: 12 point review of systems negative other than HPI and PMH  EXAM: Filed Vitals:   11/29/13 0940  BP: 112/74  Pulse: 77  Temp: 97.5 F (36.4 C)  Resp: 16    Wt Readings from Last 3 Encounters:  11/29/13 207 lb (93.895 kg)  11/19/13 205 lb 11.2 oz (93.305 kg)  11/16/13 203 lb 6.4 oz (92.262 kg)     Gen:  No acute distress.  Well nourished and well groomed.   Neurological: Alert and oriented to person,  place, and time. Coordination normal.  Head: Normocephalic and atraumatic.  Eyes: Conjunctivae are normal. Pupils are equal, round, and reactive to light. No scleral icterus.  Neck: Normal range of motion. Neck supple. No tracheal deviation or thyromegaly present.  Cardiovascular: Normal rate, regular rhythm, normal heart sounds and intact distal pulses.  Exam reveals no gallop and no friction rub.  No  murmur heard. Respiratory: Effort normal.  No respiratory distress. No chest wall tenderness. Breath sounds normal.  No wheezes, rales or rhonchi.  GI: Soft. Bowel sounds are normal. The abdomen is soft and nontender.  There is no rebound and no guarding. Midline incision has healed, but scar is very tough.  No mass palpable in right pelvis.   Musculoskeletal: Normal range of motion. Extremities are nontender.  Lymphadenopathy: No cervical, preauricular, postauricular or axillary adenopathy is present Skin: Skin is warm and dry. No rash noted. No diaphoresis. No erythema. No pallor. No clubbing, cyanosis, or edema.   Psychiatric: Normal mood and affect. Behavior is normal. Judgment and thought content normal.    LABORATORY RESULTS: Available labs are reviewed  Pathology Retroperitoneal mass, biopsy, RLQ - SPINDLE CELL NEOPLASM CONSISTENT WITH SARCOMA.  Lab Results  Component Value Date   WBC 13.6* 11/16/2013   HGB 9.9* 11/16/2013   HCT 31.8* 11/16/2013   MCV 72.9* 11/16/2013   PLT 417* 11/16/2013   Lab Results  Component Value Date   CREATININE 0.9 11/16/2013    CMET, coags normal.   RADIOLOGY RESULTS: See E-Chart or I-Site for most recent results.  Images and reports are reviewed.  Ct Chest Wo Contrast  11/15/2013   CLINICAL DATA:  Small bowel adenocarcinoma in February of 2015. [name] History colon cancer in 1997. Prostate cancer 7 years ago.  EXAM: CT CHEST WITHOUT CONTRAST  TECHNIQUE: Multidetector CT imaging of the chest was performed following the standard protocol without IV  contrast.  COMPARISON:  Abdomen and pelvis CT from 10/12/2013.  FINDINGS: There is no axillary lymphadenopathy. 8 mm short axis left axillary lymph node is evident. No mediastinal or hilar lymphadenopathy. The heart size is normal. Coronary artery calcification is noted. No pericardial or pleural effusion.  No evidence for focal airspace consolidation. No pulmonary edema. Linear scarring in the anterior right middle lobe on image 41 is unchanged. Calcified granuloma in the posterior left costophrenic sulcus is stable. 2 mm nodular density on the left major fissure is in a location that was not included in the field-of-view on the prior study. Nevertheless, this is probably a tiny subpleural lymph node.  Bone windows reveal no worrisome lytic or sclerotic osseous lesions. Several nonacute left-sided rib fractures are evident.  IMPRESSION: No findings suspicious for metastatic disease in the chest. 2 mm nodular density in the left lung is likely a subpleural lymph node. Attention on followup imaging is recommended.   Electronically Signed   By: Misty Stanley M.D.   On: 11/15/2013 14:31   Ct Biopsy  11/15/2013   INDICATION: History of prostate cancer, adenocarcinoma of the small bowel and Lynch syndrome, now with indeterminate mass within the right lower abdominal quadrant.  EXAM: CT BIOPSY OF INDETERMINATE MASS WITHIN THE RIGHT LOWER ABDOMINAL QUADRANT  COMPARISON:  CT CHEST W/O CM dated 11/15/2013; CT ABD/PELV WO CM dated 10/12/2013  MEDICATIONS: Fentanyl 100 mcg IV; Versed 2 mg IV  ANESTHESIA/SEDATION: Sedation time  16 minutes  CONTRAST:  None  COMPLICATIONS: None immediate  PROCEDURE: Informed consent was obtained from the patient following an explanation of the procedure, risks, benefits and alternatives. A time out was performed prior to the initiation of the procedure.  The patient was positioned supine on the CT table and a limited CT was performed for procedural planning demonstrating grossly unchanged  appearance of infiltrate of approximately 12.5 x 9.1 cm mass within the right lower abdominal quadrant (image 10, series 2). The procedure was planned. The  operative site was prepped and draped in the usual sterile fashion. Appropriate trajectory was confirmed with a 22 gauge spinal needle after the adjacent tissues were anesthetized with 1% Lidocaine with epinephrine.  Under intermittent CT guidance, a 17 gauge coaxial needle was advanced into the peripheral aspect of the mass. Positioning was confirmed 4 samples were obtained with an 18 gauge core needle biopsy device. Samples were placed in saline. The tract was embolized with a small amount of Gel-Foam. The co-axial needle was removed and hemostasis was achieved with manual compression.  A dressing was placed. The patient tolerated the procedure well without immediate postprocedural complication.  IMPRESSION: Technically successful CT guided core needle biopsy of infiltrative mass within the right lower abdominal quadrant.   Electronically Signed   By: Sandi Mariscal M.D.   On: 11/15/2013 15:44      ASSESSMENT AND PLAN: Retroperitoneal sarcoma Patient has a history of Lynch syndrome and a recent small bowel adenocarcinoma in addition to the new sarcoma.   The retroperitoneal sarcoma will need to be resected. It does not appear to involve any adjacent structures, but does have close proximity to the ureter. I will ask urology to place a ureteral stent prior to resection.   We will plan to do a right lower quadrant incision and try to do retroperitoneal approach.  This may be difficult since he has had prior right colectomy and retroperitoneum is likely violated.  His previous midline incision took quite a bit of time to heal, and he has had 4 surgeries via this incision.    I discussed the risks of bleeding, infection, hernia, damage to adjacent structures, possible need to take out additional structures if adherent to mass, and risk of general  complications like VTE, cardiac or pulmonary issues, urinary retention, etc.    I discussed that we would not do a bowel prep.   I also reviewed that he may need additional treatment like radiation if he has high grade tumor or close/positive margins.    30 min spent in evaluation, examination, coordination of care and counseling.  >50% of time spent in counseling.      Milus Height MD Surgical Oncology, General and [facility]      Visit Diagnoses: 1. Retroperitoneal sarcoma     Primary Care Physician: Horton Finer, MD  [facility]

## 2013-12-03 ENCOUNTER — Encounter (INDEPENDENT_AMBULATORY_CARE_PROVIDER_SITE_OTHER): Payer: Medicare Other | Admitting: General Surgery

## 2013-12-06 NOTE — Patient Instructions (Addendum)
IREOLUWA GRANT  12/06/2013                           YOUR PROCEDURE IS SCHEDULED ON: 12/14/13               PLEASE REPORT TO SHORT STAY CENTER AT :  9:30 AM               CALL THIS NUMBER IF ANY PROBLEMS THE DAY OF SURGERY :               [phone]                                REMEMBER:   Do not eat food or drink liquids AFTER MIDNIGHT   May have WATER UNTIL 6 HOURS BEFORE SURGERY (6:00 AM)               Take these medicines the morning of surgery with A SIP OF WATER: LEVOTHYROXINE   Do not wear jewelry, make-up   Do not wear lotions, powders, or perfumes.   Do not shave legs or underarms 12 hrs. before surgery (men may shave face)  Do not bring valuables to the hospital.  Contacts, dentures or bridgework may not be worn into surgery.  Leave suitcase in the car. After surgery it may be brought to your room.  For patients admitted to the hospital more than one night, checkout time is            11:00 AM                   STOP ASPIRIN / HERBAL MEDS / IBUPROFEN, ALEVE, MOTRIN, ADVIL [place] - Preparing for Surgery Before surgery, you can play an important role.  Because skin is not sterile, your skin needs to be as free of germs as possible.  You can reduce the number of germs on your skin by washing with CHG (chlorahexidine gluconate) soap before surgery.  CHG is an antiseptic cleaner which kills germs and bonds with the skin to continue killing germs even after washing. Please DO NOT use if you have an allergy to CHG or antibacterial soaps.  If your skin becomes reddened/irritated stop using the CHG and inform your nurse when you arrive at Short Stay. Do not shave (including legs and underarms) for at least 48 hours prior to the first CHG shower.  You may shave your face. Please follow these instructions carefully:  1.  Shower with CHG Soap the night before surgery and the  morning of  Surgery.   2.  If you choose to wash your hair, wash your hair first as usual with your  normal  Shampoo.   3.  After you shampoo, rinse your hair and body thoroughly to remove the  shampoo.  4.  Use CHG as you would any other liquid soap.  You can apply chg directly  to the skin and wash . Gently wash with scrungie or clean wascloth    5.  Apply the CHG Soap to your body ONLY FROM THE NECK DOWN.   Do not use on open                           Wound or open sores. Avoid contact with eyes, ears mouth and genitals (private parts).                        Genitals (private parts) with your normal soap.              6.  Wash thoroughly, paying special attention to the area where your surgery  will be performed.   7.  Thoroughly rinse your body with warm water from the neck down.   8.  DO NOT shower/wash with your normal soap after using and rinsing off  the CHG Soap .                9.  Pat yourself dry with a clean towel.             10.  Wear clean pajamas.             11.  Place clean sheets on your bed the night of your first shower and do not  sleep with pets.  Day of Surgery : Do not apply any lotions/deodorants the morning of surgery.  Please wear clean clothes to the hospital/surgery center.  FAILURE TO FOLLOW THESE INSTRUCTIONS MAY RESULT IN THE CANCELLATION OF YOUR SURGERY    PATIENT SIGNATURE_________________________________

## 2013-12-07 ENCOUNTER — Encounter (HOSPITAL_COMMUNITY): Payer: Self-pay

## 2013-12-07 ENCOUNTER — Encounter (HOSPITAL_COMMUNITY)
Admission: RE | Admit: 2013-12-07 | Discharge: 2013-12-07 | Disposition: A | Discharge status: Home / Self Care | Payer: Medicare Other | Source: Ambulatory Visit | Attending: Psychiatry | Admitting: Psychiatry

## 2013-12-07 DIAGNOSIS — Z01812 Encounter for preprocedural laboratory examination: Secondary | ICD-10-CM | POA: Insufficient documentation

## 2013-12-07 DIAGNOSIS — C480 Malignant neoplasm of retroperitoneum: Secondary | ICD-10-CM | POA: Insufficient documentation

## 2013-12-07 HISTORY — DX: Personal history of other malignant neoplasm of skin: Z85.828

## 2013-12-07 HISTORY — DX: Anemia, unspecified: D64.9

## 2013-12-07 HISTORY — DX: Personal history of other medical treatment: Z92.89

## 2013-12-07 LAB — COMPREHENSIVE METABOLIC PANEL
ALT: 23 U/L (ref 0–53)
AST: 29 U/L (ref 0–37)
Albumin: 3.2 g/dL — ABNORMAL LOW (ref 3.5–5.2)
Alkaline Phosphatase: 134 U/L — ABNORMAL HIGH (ref 39–117)
BUN: 10 mg/dL (ref 6–23)
CALCIUM: 9.6 mg/dL (ref 8.4–10.5)
CO2: 22 mEq/L (ref 19–32)
Chloride: 102 mEq/L (ref 96–112)
Creatinine, Ser: 0.91 mg/dL (ref 0.50–1.35)
GFR calc non Af Amer: 86 mL/min — ABNORMAL LOW (ref >90)
GLUCOSE: 99 mg/dL (ref 70–99)
Potassium: 4.8 mEq/L (ref 3.7–5.3)
Sodium: 136 mEq/L — ABNORMAL LOW (ref 137–147)
TOTAL PROTEIN: 7.4 g/dL (ref 6.0–8.3)
Total Bilirubin: 0.2 mg/dL — ABNORMAL LOW (ref 0.3–1.2)

## 2013-12-07 LAB — CBC WITH DIFFERENTIAL/PLATELET
BASOS PCT: 0 % (ref 0–1)
Basophils Absolute: 0 10*3/uL (ref 0.0–0.1)
EOS ABS: 0.6 10*3/uL (ref 0.0–0.7)
Eosinophils Relative: 5 % (ref 0–5)
HEMATOCRIT: 33.9 % — AB (ref 39.0–52.0)
HEMOGLOBIN: 10.4 g/dL — AB (ref 13.0–17.0)
Lymphocytes Relative: 13 % (ref 12–46)
Lymphs Abs: 1.6 10*3/uL (ref 0.7–4.0)
MCH: 22.9 pg — ABNORMAL LOW (ref 26.0–34.0)
MCHC: 30.7 g/dL (ref 30.0–36.0)
MCV: 74.5 fL — ABNORMAL LOW (ref 78.0–100.0)
Monocytes Absolute: 1.3 10*3/uL — ABNORMAL HIGH (ref 0.1–1.0)
Monocytes Relative: 11 % (ref 3–12)
NEUTROS ABS: 8.6 10*3/uL — AB (ref 1.7–7.7)
NEUTROS PCT: 71 % (ref 43–77)
Platelets: 511 10*3/uL — ABNORMAL HIGH (ref 150–400)
RBC: 4.55 MIL/uL (ref 4.22–5.81)
RDW: 22.5 % — ABNORMAL HIGH (ref 11.5–15.5)
WBC: 12.1 10*3/uL — ABNORMAL HIGH (ref 4.0–10.5)

## 2013-12-07 LAB — URINALYSIS, ROUTINE W REFLEX MICROSCOPIC
Bilirubin Urine: NEGATIVE
Glucose, UA: NEGATIVE mg/dL
Hgb urine dipstick: NEGATIVE
Ketones, ur: NEGATIVE mg/dL
LEUKOCYTES UA: NEGATIVE
Nitrite: NEGATIVE
PROTEIN: NEGATIVE mg/dL
Specific Gravity, Urine: 1.012 (ref 1.005–1.030)
Urobilinogen, UA: 0.2 mg/dL (ref 0.0–1.0)
pH: 6 (ref 5.0–8.0)

## 2013-12-07 LAB — HEMOGLOBIN A1C
HEMOGLOBIN A1C: 6.6 % — AB (ref <5.7)
Mean Plasma Glucose: 143 mg/dL — ABNORMAL HIGH (ref <117)

## 2013-12-07 NOTE — Progress Notes (Signed)
12/07/13 1046  OBSTRUCTIVE SLEEP APNEA  Have you ever been diagnosed with sleep apnea through a sleep study? No (PT HAD SLEEP STUDY DONE 2010 - STATES NO SLEEP APNEA)  Do you snore loudly (loud enough to be heard through closed doors)?  1  Do you often feel tired, fatigued, or sleepy during the daytime? 0  Has anyone observed you stop breathing during your sleep? 1  Do you have, or are you being treated for high blood pressure? 1  BMI more than 35 kg/m2? 0  Age over 2 years old? 1  Neck circumference greater than 40 cm/18 inches? 0  Gender: 1  Obstructive Sleep Apnea Score 5  Score 4 or greater  Results sent to PCP

## 2013-12-07 NOTE — Progress Notes (Signed)
Abnormal CBC / CMET faxed to Dr. Barry Dienes

## 2013-12-09 ENCOUNTER — Encounter (INDEPENDENT_AMBULATORY_CARE_PROVIDER_SITE_OTHER): Payer: Self-pay

## 2013-12-10 ENCOUNTER — Other Ambulatory Visit: Payer: Medicare Other

## 2013-12-13 ENCOUNTER — Telehealth (INDEPENDENT_AMBULATORY_CARE_PROVIDER_SITE_OTHER): Payer: Self-pay | Admitting: General Surgery

## 2013-12-13 NOTE — Telephone Encounter (Signed)
Called [employer] and asked why did the patient did not get paid back on the 10-26-13 and they stated that the wrong dates was put on the forms, they stated that the patient will have write a letter to clear up the dates and Dr Redmond Pulling will have to re-do the right dates, I will fax Dr Redmond Pulling notes to [place] back to them and I spoke to [facility] Dr Barry Dienes nurses who is doing surgery on this patient on 12-14-13, and Dr. Barry Dienes will have to fill out the STD forms also and copy of the op note fax to [place]. I have all the old and new STD forms with Deneise Lever until I get the letter from the patient

## 2013-12-14 ENCOUNTER — Encounter (HOSPITAL_COMMUNITY): Payer: Medicare Other | Admitting: Anesthesiology

## 2013-12-14 ENCOUNTER — Encounter (HOSPITAL_COMMUNITY)
Admission: RE | Disposition: A | Discharge status: Home / Self Care | Payer: Self-pay | Source: Ambulatory Visit | Attending: General Surgery

## 2013-12-14 ENCOUNTER — Inpatient Hospital Stay (HOSPITAL_COMMUNITY)
Admission: RE | Admit: 2013-12-14 | Discharge: 2013-12-22 | DRG: 827 | Disposition: A | Discharge status: Home / Self Care | Payer: Medicare Other | Source: Ambulatory Visit | Attending: General Surgery | Admitting: General Surgery

## 2013-12-14 ENCOUNTER — Inpatient Hospital Stay (HOSPITAL_COMMUNITY): Payer: Medicare Other | Admitting: Anesthesiology

## 2013-12-14 ENCOUNTER — Encounter (HOSPITAL_COMMUNITY): Payer: Self-pay

## 2013-12-14 ENCOUNTER — Inpatient Hospital Stay (HOSPITAL_COMMUNITY): Payer: Medicare Other

## 2013-12-14 DIAGNOSIS — K560 Paralytic ileus: Secondary | ICD-10-CM | POA: Diagnosis not present

## 2013-12-14 DIAGNOSIS — Z800 Family history of malignant neoplasm of digestive organs: Secondary | ICD-10-CM

## 2013-12-14 DIAGNOSIS — Y836 Removal of other organ (partial) (total) as the cause of abnormal reaction of the patient, or of later complication, without mention of misadventure at the time of the procedure: Secondary | ICD-10-CM | POA: Diagnosis not present

## 2013-12-14 DIAGNOSIS — Z9049 Acquired absence of other specified parts of digestive tract: Secondary | ICD-10-CM

## 2013-12-14 DIAGNOSIS — E785 Hyperlipidemia, unspecified: Secondary | ICD-10-CM | POA: Diagnosis present

## 2013-12-14 DIAGNOSIS — Z85038 Personal history of other malignant neoplasm of large intestine: Secondary | ICD-10-CM

## 2013-12-14 DIAGNOSIS — C480 Malignant neoplasm of retroperitoneum: Secondary | ICD-10-CM

## 2013-12-14 DIAGNOSIS — Z801 Family history of malignant neoplasm of trachea, bronchus and lung: Secondary | ICD-10-CM

## 2013-12-14 DIAGNOSIS — Z87891 Personal history of nicotine dependence: Secondary | ICD-10-CM

## 2013-12-14 DIAGNOSIS — E039 Hypothyroidism, unspecified: Secondary | ICD-10-CM | POA: Diagnosis present

## 2013-12-14 DIAGNOSIS — Z808 Family history of malignant neoplasm of other organs or systems: Secondary | ICD-10-CM

## 2013-12-14 DIAGNOSIS — E119 Type 2 diabetes mellitus without complications: Secondary | ICD-10-CM | POA: Diagnosis present

## 2013-12-14 DIAGNOSIS — I10 Essential (primary) hypertension: Secondary | ICD-10-CM | POA: Diagnosis present

## 2013-12-14 DIAGNOSIS — N529 Male erectile dysfunction, unspecified: Secondary | ICD-10-CM | POA: Diagnosis present

## 2013-12-14 DIAGNOSIS — Z1509 Genetic susceptibility to other malignant neoplasm: Secondary | ICD-10-CM

## 2013-12-14 DIAGNOSIS — K929 Disease of digestive system, unspecified: Secondary | ICD-10-CM | POA: Diagnosis not present

## 2013-12-14 DIAGNOSIS — C61 Malignant neoplasm of prostate: Secondary | ICD-10-CM | POA: Diagnosis present

## 2013-12-14 DIAGNOSIS — Z79899 Other long term (current) drug therapy: Secondary | ICD-10-CM

## 2013-12-14 DIAGNOSIS — D509 Iron deficiency anemia, unspecified: Secondary | ICD-10-CM | POA: Diagnosis present

## 2013-12-14 DIAGNOSIS — Z01812 Encounter for preprocedural laboratory examination: Secondary | ICD-10-CM

## 2013-12-14 DIAGNOSIS — M1710 Unilateral primary osteoarthritis, unspecified knee: Secondary | ICD-10-CM | POA: Diagnosis present

## 2013-12-14 HISTORY — PX: LAPAROTOMY: SHX154

## 2013-12-14 HISTORY — PX: CYSTOSCOPY WITH RETROGRADE PYELOGRAM, URETEROSCOPY AND STENT PLACEMENT: SHX5789

## 2013-12-14 LAB — BASIC METABOLIC PANEL
BUN: 12 mg/dL (ref 6–23)
CHLORIDE: 101 meq/L (ref 96–112)
CO2: 19 meq/L (ref 19–32)
CREATININE: 0.92 mg/dL (ref 0.50–1.35)
Calcium: 9.4 mg/dL (ref 8.4–10.5)
GFR calc Af Amer: >90 mL/min (ref >90)
GFR calc non Af Amer: 85 mL/min — ABNORMAL LOW (ref >90)
GLUCOSE: 211 mg/dL — AB (ref 70–99)
POTASSIUM: 4.8 meq/L (ref 3.7–5.3)
Sodium: 136 mEq/L — ABNORMAL LOW (ref 137–147)

## 2013-12-14 LAB — MRSA PCR SCREENING: MRSA BY PCR: NEGATIVE

## 2013-12-14 LAB — CBC
HCT: 31.2 % — ABNORMAL LOW (ref 39.0–52.0)
Hemoglobin: 10 g/dL — ABNORMAL LOW (ref 13.0–17.0)
MCH: 24.3 pg — ABNORMAL LOW (ref 26.0–34.0)
MCHC: 32.1 g/dL (ref 30.0–36.0)
MCV: 75.7 fL — ABNORMAL LOW (ref 78.0–100.0)
Platelets: 367 10*3/uL (ref 150–400)
RBC: 4.12 MIL/uL — ABNORMAL LOW (ref 4.22–5.81)
RDW: 21.1 % — AB (ref 11.5–15.5)
WBC: 16.8 10*3/uL — ABNORMAL HIGH (ref 4.0–10.5)

## 2013-12-14 LAB — CREATININE, SERUM
CREATININE: 0.92 mg/dL (ref 0.50–1.35)
GFR calc Af Amer: >90 mL/min (ref >90)
GFR calc non Af Amer: 85 mL/min — ABNORMAL LOW (ref >90)

## 2013-12-14 LAB — ABO/RH: ABO/RH(D): O POS

## 2013-12-14 LAB — GLUCOSE, CAPILLARY
GLUCOSE-CAPILLARY: 229 mg/dL — AB (ref 70–99)
Glucose-Capillary: 134 mg/dL — ABNORMAL HIGH (ref 70–99)
Glucose-Capillary: 182 mg/dL — ABNORMAL HIGH (ref 70–99)

## 2013-12-14 SURGERY — LAPAROTOMY, EXPLORATORY
Anesthesia: General | Site: Ureter | Laterality: Right

## 2013-12-14 MED ORDER — LIDOCAINE HCL (CARDIAC) 20 MG/ML IV SOLN
INTRAVENOUS | Status: DC | PRN
  Administered 2013-12-14: 60 mg via INTRAVENOUS
  Administered 2013-12-14: 40 mg via INTRAVENOUS

## 2013-12-14 MED ORDER — LACTATED RINGERS IV SOLN
INTRAVENOUS | Status: DC
  Administered 2013-12-14: 1000 mL via INTRAVENOUS

## 2013-12-14 MED ORDER — MIDAZOLAM HCL 2 MG/2ML IJ SOLN
INTRAMUSCULAR | Status: AC
  Filled 2013-12-14: qty 2

## 2013-12-14 MED ORDER — INSULIN ASPART 100 UNIT/ML ~~LOC~~ SOLN
0.0000 [IU] | Freq: Three times a day (TID) | SUBCUTANEOUS | Status: DC
  Administered 2013-12-15: 2 [IU] via SUBCUTANEOUS
  Administered 2013-12-15: 1 [IU] via SUBCUTANEOUS
  Administered 2013-12-15: 5 [IU] via SUBCUTANEOUS
  Administered 2013-12-16 – 2013-12-17 (×2): 1 [IU] via SUBCUTANEOUS
  Administered 2013-12-17: 2 [IU] via SUBCUTANEOUS
  Administered 2013-12-18: 3 [IU] via SUBCUTANEOUS
  Administered 2013-12-18: 2 [IU] via SUBCUTANEOUS
  Administered 2013-12-18: 3 [IU] via SUBCUTANEOUS
  Administered 2013-12-19: 2 [IU] via SUBCUTANEOUS

## 2013-12-14 MED ORDER — MIDAZOLAM HCL 5 MG/5ML IJ SOLN
INTRAMUSCULAR | Status: DC | PRN
  Administered 2013-12-14: 2 mg via INTRAVENOUS

## 2013-12-14 MED ORDER — LACTATED RINGERS IV SOLN
INTRAVENOUS | Status: DC | PRN
  Administered 2013-12-14 (×4): via INTRAVENOUS

## 2013-12-14 MED ORDER — CEFAZOLIN SODIUM-DEXTROSE 2-3 GM-% IV SOLR
INTRAVENOUS | Status: AC
  Filled 2013-12-14: qty 50

## 2013-12-14 MED ORDER — 0.9 % SODIUM CHLORIDE (POUR BTL) OPTIME
TOPICAL | Status: DC | PRN
  Administered 2013-12-14: 3000 mL

## 2013-12-14 MED ORDER — IOHEXOL 300 MG/ML  SOLN
INTRAMUSCULAR | Status: DC | PRN
  Administered 2013-12-14: 5 mL

## 2013-12-14 MED ORDER — HYDROMORPHONE HCL PF 1 MG/ML IJ SOLN
INTRAMUSCULAR | Status: DC | PRN
  Administered 2013-12-14 (×4): 0.5 mg via INTRAVENOUS

## 2013-12-14 MED ORDER — NALOXONE HCL 0.4 MG/ML IJ SOLN: 0.4000 mg | INTRAMUSCULAR | Status: DC | PRN

## 2013-12-14 MED ORDER — CEFAZOLIN SODIUM 1-5 GM-% IV SOLN
1.0000 g | Freq: Four times a day (QID) | INTRAVENOUS | Status: AC
  Administered 2013-12-15 (×2): 1 g via INTRAVENOUS
  Filled 2013-12-14 (×4): qty 50

## 2013-12-14 MED ORDER — ROCURONIUM BROMIDE 100 MG/10ML IV SOLN
INTRAVENOUS | Status: DC | PRN
  Administered 2013-12-14: 50 mg via INTRAVENOUS

## 2013-12-14 MED ORDER — KCL IN DEXTROSE-NACL 20-5-0.45 MEQ/L-%-% IV SOLN
INTRAVENOUS | Status: DC
  Administered 2013-12-14: 23:00:00 via INTRAVENOUS
  Administered 2013-12-15: 125 mL via INTRAVENOUS
  Administered 2013-12-16 – 2013-12-17 (×4): via INTRAVENOUS
  Administered 2013-12-18: 50 mL/h via INTRAVENOUS
  Administered 2013-12-19 – 2013-12-21 (×5): via INTRAVENOUS
  Filled 2013-12-14 (×20): qty 1000

## 2013-12-14 MED ORDER — ONDANSETRON HCL 4 MG PO TABS: 4.0000 mg | ORAL_TABLET | Freq: Four times a day (QID) | ORAL | Status: DC | PRN

## 2013-12-14 MED ORDER — FENTANYL CITRATE 0.05 MG/ML IJ SOLN
INTRAMUSCULAR | Status: AC
  Filled 2013-12-14: qty 5

## 2013-12-14 MED ORDER — HYDROMORPHONE HCL PF 1 MG/ML IJ SOLN
INTRAMUSCULAR | Status: AC
  Filled 2013-12-14: qty 1

## 2013-12-14 MED ORDER — ROCURONIUM BROMIDE 100 MG/10ML IV SOLN
INTRAVENOUS | Status: AC
  Filled 2013-12-14: qty 1

## 2013-12-14 MED ORDER — MEPERIDINE HCL 50 MG/ML IJ SOLN: 6.2500 mg | INTRAMUSCULAR | Status: DC | PRN

## 2013-12-14 MED ORDER — HYDROMORPHONE HCL PF 2 MG/ML IJ SOLN
INTRAMUSCULAR | Status: AC
  Filled 2013-12-14: qty 1

## 2013-12-14 MED ORDER — ONDANSETRON HCL 4 MG/2ML IJ SOLN
INTRAMUSCULAR | Status: AC
  Filled 2013-12-14: qty 2

## 2013-12-14 MED ORDER — PHENYLEPHRINE 40 MCG/ML (10ML) SYRINGE FOR IV PUSH (FOR BLOOD PRESSURE SUPPORT)
PREFILLED_SYRINGE | INTRAVENOUS | Status: AC
  Filled 2013-12-14: qty 10

## 2013-12-14 MED ORDER — ALBUMIN HUMAN 5 % IV SOLN
INTRAVENOUS | Status: DC | PRN
  Administered 2013-12-14: 17:00:00 via INTRAVENOUS

## 2013-12-14 MED ORDER — ACETAMINOPHEN 325 MG PO TABS
650.0000 mg | ORAL_TABLET | Freq: Four times a day (QID) | ORAL | Status: DC | PRN
Start: 2013-12-14 — End: 2013-12-22
  Filled 2013-12-14: qty 2

## 2013-12-14 MED ORDER — LIDOCAINE HCL (CARDIAC) 20 MG/ML IV SOLN
INTRAVENOUS | Status: AC
  Filled 2013-12-14: qty 5

## 2013-12-14 MED ORDER — NEOSTIGMINE METHYLSULFATE 1 MG/ML IJ SOLN
INTRAMUSCULAR | Status: AC
  Filled 2013-12-14: qty 10

## 2013-12-14 MED ORDER — ONDANSETRON HCL 4 MG/2ML IJ SOLN
4.0000 mg | Freq: Four times a day (QID) | INTRAMUSCULAR | Status: DC | PRN
  Filled 2013-12-14: qty 2

## 2013-12-14 MED ORDER — CEFAZOLIN SODIUM-DEXTROSE 2-3 GM-% IV SOLR
2.0000 g | INTRAVENOUS | Status: AC
  Administered 2013-12-14 (×2): 2 g via INTRAVENOUS

## 2013-12-14 MED ORDER — LEVOTHYROXINE SODIUM 50 MCG PO TABS
50.0000 ug | ORAL_TABLET | Freq: Every day | ORAL | Status: DC
  Administered 2013-12-15 – 2013-12-18 (×4): 50 ug via ORAL
  Filled 2013-12-14 (×6): qty 1

## 2013-12-14 MED ORDER — DEXAMETHASONE SODIUM PHOSPHATE 10 MG/ML IJ SOLN
INTRAMUSCULAR | Status: DC | PRN
  Administered 2013-12-14: 10 mg via INTRAVENOUS

## 2013-12-14 MED ORDER — DEXAMETHASONE SODIUM PHOSPHATE 10 MG/ML IJ SOLN
INTRAMUSCULAR | Status: AC
  Filled 2013-12-14: qty 1

## 2013-12-14 MED ORDER — ONDANSETRON HCL 4 MG/2ML IJ SOLN
INTRAMUSCULAR | Status: DC | PRN
  Administered 2013-12-14: 4 mg via INTRAVENOUS

## 2013-12-14 MED ORDER — ALBUMIN HUMAN 5 % IV SOLN
INTRAVENOUS | Status: AC
  Filled 2013-12-14: qty 250

## 2013-12-14 MED ORDER — PROPOFOL 10 MG/ML IV BOLUS
INTRAVENOUS | Status: AC
  Filled 2013-12-14: qty 20

## 2013-12-14 MED ORDER — NEOSTIGMINE METHYLSULFATE 1 MG/ML IJ SOLN
INTRAMUSCULAR | Status: DC | PRN
  Administered 2013-12-14: 2 mg via INTRAVENOUS

## 2013-12-14 MED ORDER — LACTATED RINGERS IV SOLN: INTRAVENOUS | Status: DC | PRN

## 2013-12-14 MED ORDER — ALBUMIN HUMAN 5 % IV SOLN: 12.5000 g | Freq: Once | INTRAVENOUS | Status: DC

## 2013-12-14 MED ORDER — GLYCOPYRROLATE 0.2 MG/ML IJ SOLN
INTRAMUSCULAR | Status: DC | PRN
  Administered 2013-12-14: .4 mg via INTRAVENOUS

## 2013-12-14 MED ORDER — BUPIVACAINE 0.25 % ON-Q PUMP DUAL CATH 300 ML
300.0000 mL | INJECTION | Status: DC
  Administered 2013-12-14: 300 mL
  Filled 2013-12-14: qty 300

## 2013-12-14 MED ORDER — SODIUM CHLORIDE 0.9 % IJ SOLN
9.0000 mL | INTRAMUSCULAR | Status: DC | PRN
Start: 2013-12-14 — End: 2013-12-20

## 2013-12-14 MED ORDER — DIPHENHYDRAMINE HCL 50 MG/ML IJ SOLN: 12.5000 mg | Freq: Four times a day (QID) | INTRAMUSCULAR | Status: DC | PRN

## 2013-12-14 MED ORDER — ONDANSETRON HCL 4 MG/2ML IJ SOLN
4.0000 mg | Freq: Four times a day (QID) | INTRAMUSCULAR | Status: DC | PRN
  Administered 2013-12-17 – 2013-12-19 (×8): 4 mg via INTRAVENOUS
  Filled 2013-12-14 (×7): qty 2

## 2013-12-14 MED ORDER — MORPHINE SULFATE 2 MG/ML IJ SOLN: 1.0000 mg | INTRAMUSCULAR | Status: DC | PRN

## 2013-12-14 MED ORDER — PROMETHAZINE HCL 25 MG/ML IJ SOLN: 6.2500 mg | INTRAMUSCULAR | Status: DC | PRN

## 2013-12-14 MED ORDER — CISATRACURIUM BESYLATE 20 MG/10ML IV SOLN
INTRAVENOUS | Status: AC
  Filled 2013-12-14: qty 10

## 2013-12-14 MED ORDER — MORPHINE SULFATE (PF) 1 MG/ML IV SOLN
INTRAVENOUS | Status: AC
  Administered 2013-12-14: 22:00:00
  Filled 2013-12-14: qty 25

## 2013-12-14 MED ORDER — ONDANSETRON HCL 4 MG/2ML IJ SOLN
INTRAMUSCULAR | Status: AC
Start: 2013-12-14 — End: 2013-12-14
  Filled 2013-12-14: qty 2

## 2013-12-14 MED ORDER — HEPARIN SODIUM (PORCINE) 5000 UNIT/ML IJ SOLN
5000.0000 [IU] | Freq: Three times a day (TID) | INTRAMUSCULAR | Status: DC
  Administered 2013-12-15 – 2013-12-21 (×20): 5000 [IU] via SUBCUTANEOUS
  Filled 2013-12-14 (×25): qty 1

## 2013-12-14 MED ORDER — CISATRACURIUM BESYLATE (PF) 10 MG/5ML IV SOLN
INTRAVENOUS | Status: DC | PRN
  Administered 2013-12-14 (×3): 2 mg via INTRAVENOUS
  Administered 2013-12-14: 4 mg via INTRAVENOUS
  Administered 2013-12-14: 2 mg via INTRAVENOUS

## 2013-12-14 MED ORDER — PROPOFOL 10 MG/ML IV BOLUS
INTRAVENOUS | Status: DC | PRN
  Administered 2013-12-14: 180 mg via INTRAVENOUS

## 2013-12-14 MED ORDER — HYDROMORPHONE HCL PF 1 MG/ML IJ SOLN
0.2500 mg | INTRAMUSCULAR | Status: DC | PRN
  Administered 2013-12-14 (×4): 0.5 mg via INTRAVENOUS

## 2013-12-14 MED ORDER — FENTANYL CITRATE 0.05 MG/ML IJ SOLN: 25.0000 ug | INTRAMUSCULAR | Status: DC | PRN

## 2013-12-14 MED ORDER — AMLODIPINE BESYLATE 5 MG PO TABS
5.0000 mg | ORAL_TABLET | Freq: Once | ORAL | Status: AC
  Administered 2013-12-14: 5 mg via ORAL
  Filled 2013-12-14: qty 1

## 2013-12-14 MED ORDER — DIPHENOXYLATE-ATROPINE 2.5-0.025 MG PO TABS: 1.0000 | ORAL_TABLET | Freq: Four times a day (QID) | ORAL | Status: DC | PRN

## 2013-12-14 MED ORDER — PHENYLEPHRINE HCL 10 MG/ML IJ SOLN
INTRAMUSCULAR | Status: DC | PRN
  Administered 2013-12-14 (×2): 80 ug via INTRAVENOUS

## 2013-12-14 MED ORDER — MORPHINE SULFATE (PF) 1 MG/ML IV SOLN
INTRAVENOUS | Status: DC
Start: 2013-12-14 — End: 2013-12-20
  Administered 2013-12-14: 3.2 mg via INTRAVENOUS
  Administered 2013-12-15: 10:00:00 via INTRAVENOUS
  Administered 2013-12-15: 6 mg via INTRAVENOUS
  Administered 2013-12-15: 16:00:00 via INTRAVENOUS
  Administered 2013-12-15: 6 mg via INTRAVENOUS
  Administered 2013-12-15: 7 mg via INTRAVENOUS
  Administered 2013-12-15: 32.41 mg via INTRAVENOUS
  Administered 2013-12-15: 29.39 mg via INTRAVENOUS
  Administered 2013-12-16: 126 mg via INTRAVENOUS
  Administered 2013-12-16: via INTRAVENOUS
  Administered 2013-12-16: 20.92 mg via INTRAVENOUS
  Administered 2013-12-16: 04:00:00 via INTRAVENOUS
  Administered 2013-12-16: 22.5 mg via INTRAVENOUS
  Administered 2013-12-16: 12 mg via INTRAVENOUS
  Administered 2013-12-16 (×3): via INTRAVENOUS
  Administered 2013-12-16: 42.82 mg via INTRAVENOUS
  Administered 2013-12-16: 25.5 mg via INTRAVENOUS
  Administered 2013-12-17: 19.5 mg via INTRAVENOUS
  Administered 2013-12-17: 05:00:00 via INTRAVENOUS
  Administered 2013-12-17: 2.56 mg via INTRAVENOUS
  Administered 2013-12-18: 8.43 mg via INTRAVENOUS
  Administered 2013-12-18: 21 mg via INTRAVENOUS
  Administered 2013-12-18: 18 mg via INTRAVENOUS
  Administered 2013-12-18: 13.5 mg via INTRAVENOUS
  Administered 2013-12-18: 8 mg via INTRAVENOUS
  Administered 2013-12-18 (×2): via INTRAVENOUS
  Administered 2013-12-18: 7.5 mg via INTRAVENOUS
  Administered 2013-12-19: 4.5 mg via INTRAVENOUS
  Administered 2013-12-19: 17:00:00 via INTRAVENOUS
  Administered 2013-12-19: 8.9 mg via INTRAVENOUS
  Administered 2013-12-19 (×2): via INTRAVENOUS
  Administered 2013-12-19 (×2): 12 mg via INTRAVENOUS
  Administered 2013-12-19: 23 mg via INTRAVENOUS
  Administered 2013-12-19: 10.35 mg via INTRAVENOUS
  Administered 2013-12-20: 17.51 mg via INTRAVENOUS
  Administered 2013-12-20 (×2): via INTRAVENOUS
  Filled 2013-12-14 (×21): qty 25

## 2013-12-14 MED ORDER — BUPIVACAINE ON-Q PAIN PUMP (FOR ORDER SET NO CHG)
INJECTION | Status: AC
  Filled 2013-12-14: qty 1

## 2013-12-14 MED ORDER — SODIUM CHLORIDE 0.9 % IV SOLN
INTRAVENOUS | Status: DC | PRN
  Administered 2013-12-14 (×2): via INTRAVENOUS

## 2013-12-14 MED ORDER — DIPHENHYDRAMINE HCL 12.5 MG/5ML PO ELIX: 12.5000 mg | ORAL_SOLUTION | Freq: Four times a day (QID) | ORAL | Status: DC | PRN

## 2013-12-14 MED ORDER — FENTANYL CITRATE 0.05 MG/ML IJ SOLN
INTRAMUSCULAR | Status: DC | PRN
  Administered 2013-12-14: 100 ug via INTRAVENOUS
  Administered 2013-12-14 (×3): 25 ug via INTRAVENOUS
  Administered 2013-12-14 (×2): 50 ug via INTRAVENOUS
  Administered 2013-12-14: 100 ug via INTRAVENOUS
  Administered 2013-12-14: 25 ug via INTRAVENOUS

## 2013-12-14 MED ORDER — GLYCOPYRROLATE 0.2 MG/ML IJ SOLN
INTRAMUSCULAR | Status: AC
  Filled 2013-12-14: qty 3

## 2013-12-14 SURGICAL SUPPLY — 69 items
APPLICATOR COTTON TIP 6IN STRL (MISCELLANEOUS) ×8 IMPLANT
BAG URINE DRAINAGE (UROLOGICAL SUPPLIES) ×4 IMPLANT
BASKET ZERO TIP NITINOL 2.4FR (BASKET) IMPLANT
BLADE 11 SAFETY STRL DISP (BLADE) ×4 IMPLANT
BLADE EXTENDED COATED 6.5IN (ELECTRODE) ×8 IMPLANT
BLADE HEX COATED 2.75 (ELECTRODE) ×4 IMPLANT
BLADE SURG SZ10 CARB STEEL (BLADE) ×4 IMPLANT
CANISTER SUCTION 2500CC (MISCELLANEOUS) IMPLANT
CATH INTERMIT  6FR 70CM (CATHETERS) ×8 IMPLANT
CATH KIT ON-Q SILVERSOAK 5IN (CATHETERS) ×8 IMPLANT
CLIP TI LARGE 6 (CLIP) ×8 IMPLANT
CLIP TI MEDIUM 6 (CLIP) ×12 IMPLANT
CLIP TI WIDE RED SMALL 6 (CLIP) ×4 IMPLANT
CLOTH BEACON ORANGE TIMEOUT ST (SAFETY) ×4 IMPLANT
COVER DUST 16X22 (MISCELLANEOUS) ×4 IMPLANT
COVER MAYO STAND STRL (DRAPES) ×4 IMPLANT
DRAIN CHANNEL 19F RND (DRAIN) IMPLANT
DRAPE CAMERA CLOSED 9X96 (DRAPES) ×4 IMPLANT
DRAPE LAPAROSCOPIC ABDOMINAL (DRAPES) ×4 IMPLANT
DRAPE LG THREE QUARTER DISP (DRAPES) ×4 IMPLANT
DRAPE WARM FLUID 44X44 (DRAPE) ×4 IMPLANT
DRESSING TELFA ISLAND 4X8 (GAUZE/BANDAGES/DRESSINGS) ×4 IMPLANT
DRSG COVADERM 4X8 (GAUZE/BANDAGES/DRESSINGS) ×4 IMPLANT
DRSG PAD ABDOMINAL 8X10 ST (GAUZE/BANDAGES/DRESSINGS) IMPLANT
DRSG TELFA 4X10 ISLAND STR (GAUZE/BANDAGES/DRESSINGS) ×4 IMPLANT
ELECT REM PT RETURN 9FT ADLT (ELECTROSURGICAL) ×4
ELECTRODE REM PT RTRN 9FT ADLT (ELECTROSURGICAL) ×2 IMPLANT
EVACUATOR DRAINAGE 10X20 100CC (DRAIN) IMPLANT
EVACUATOR SILICONE 100CC (DRAIN)
FIBER LASER FLEXIVA 200 (UROLOGICAL SUPPLIES) IMPLANT
FIBER LASER FLEXIVA 365 (UROLOGICAL SUPPLIES) IMPLANT
GAUZE SPONGE 4X4 16PLY XRAY LF (GAUZE/BANDAGES/DRESSINGS) ×8 IMPLANT
GLOVE BIO SURGEON STRL SZ 6 (GLOVE) ×12 IMPLANT
GLOVE BIOGEL M STRL SZ7.5 (GLOVE) ×12 IMPLANT
GLOVE BIOGEL PI IND STRL 6.5 (GLOVE) ×10 IMPLANT
GLOVE BIOGEL PI IND STRL 7.0 (GLOVE) ×10 IMPLANT
GLOVE BIOGEL PI IND STRL 8.5 (GLOVE) ×2 IMPLANT
GLOVE BIOGEL PI INDICATOR 6.5 (GLOVE) ×10
GLOVE BIOGEL PI INDICATOR 7.0 (GLOVE) ×10
GLOVE BIOGEL PI INDICATOR 8.5 (GLOVE) ×2
GLOVE INDICATOR 6.5 STRL GRN (GLOVE) ×8 IMPLANT
GLOVE SURG SS PI 8.5 STRL IVOR (GLOVE) ×2
GLOVE SURG SS PI 8.5 STRL STRW (GLOVE) ×2 IMPLANT
GOWN STRL REUS W/TWL 2XL LVL3 (GOWN DISPOSABLE) ×16 IMPLANT
GOWN STRL REUS W/TWL LRG LVL4 (GOWN DISPOSABLE) ×8 IMPLANT
GOWN STRL REUS W/TWL XL LVL3 (GOWN DISPOSABLE) ×4 IMPLANT
GUIDEWIRE .038 (WIRE) IMPLANT
GUIDEWIRE ANG ZIPWIRE 038X150 (WIRE) IMPLANT
GUIDEWIRE STR DUAL SENSOR (WIRE) ×4 IMPLANT
IV NS IRRIG 3000ML ARTHROMATIC (IV SOLUTION) ×4 IMPLANT
KIT BASIN OR (CUSTOM PROCEDURE TRAY) ×4 IMPLANT
LEGGING LITHOTOMY PAIR STRL (DRAPES) ×4 IMPLANT
NS IRRIG 1000ML POUR BTL (IV SOLUTION) ×12 IMPLANT
PACK CYSTO (CUSTOM PROCEDURE TRAY) ×4 IMPLANT
PACK GENERAL/GYN (CUSTOM PROCEDURE TRAY) ×4 IMPLANT
SHEARS HARMONIC 9CM CVD (BLADE) ×4 IMPLANT
SPONGE GAUZE 4X4 12PLY (GAUZE/BANDAGES/DRESSINGS) ×4 IMPLANT
SPONGE LAP 18X18 X RAY DECT (DISPOSABLE) ×16 IMPLANT
STAPLER VISISTAT 35W (STAPLE) ×4 IMPLANT
SUCTION POOLE TIP (SUCTIONS) ×4 IMPLANT
SUT PDS AB 1 CTX 36 (SUTURE) IMPLANT
SUT PDS AB 1 TP1 96 (SUTURE) ×4 IMPLANT
SUT VIC AB 2-0 SH 18 (SUTURE) ×4 IMPLANT
SUT VIC AB 3-0 SH 18 (SUTURE) ×4 IMPLANT
TOWEL OR 17X26 10 PK STRL BLUE (TOWEL DISPOSABLE) ×8 IMPLANT
TRAY FOLEY CATH 14FRSI W/METER (CATHETERS) IMPLANT
TRAY FOLEY CATH 16FRSI W/METER (SET/KITS/TRAYS/PACK) ×4 IMPLANT
TUNNELER SHEATH ON-Q 16GX12 DP (PAIN MANAGEMENT) ×4 IMPLANT
YANKAUER SUCT BULB TIP NO VENT (SUCTIONS) ×4 IMPLANT

## 2013-12-14 NOTE — Transfer of Care (Signed)
Immediate Anesthesia Transfer of Care Note  Patient: Dean Owens  Procedure(s) Performed: Procedure(s): EXPLORATORY LAPAROTOMY AND RESECTION OF RETROPERITONEAL SARCOMA (N/A) CYSTOSCOPY WITH RIGHT RETROGRADE PYELOGRAM / INSERTION RIGHT URETERAL STENT (Right)  Patient Location: PACU  Anesthesia Type:General  Level of Consciousness: awake, oriented, patient cooperative, lethargic and responds to stimulation  Airway & Oxygen Therapy: Patient Spontanous Breathing and Patient connected to face mask oxygen  Post-op Assessment: Report given to PACU RN, Post -op Vital signs reviewed and stable and Patient moving all extremities  Post vital signs: Reviewed and stable  Complications: No apparent anesthesia complications

## 2013-12-14 NOTE — Op Note (Signed)
PRE-OPERATIVE DIAGNOSIS: Right retroperitoneal sarcoma  POST-OPERATIVE DIAGNOSIS:  Same  PROCEDURE:  Procedure(s): Resection of 21 cm retroperitoneal mass  SURGEON:  Surgeon(s): [name], MD  ASSISTANT:  Greer Pickerel, MD  ANESTHESIA:   general  DRAINS: OnQ pain pump   LOCAL MEDICATIONS USED:  MARCAINE     SPECIMEN:  Source of Specimen:  right retroperitoneal sarcoma  DISPOSITION OF SPECIMEN:  PATHOLOGY  COUNTS:  YES  DICTATION: .Dragon Dictation  PLAN OF CARE: Admit to inpatient   PATIENT DISPOSITION:  ICU - extubated and stable.   EBL: 626mL  FINDINGS:  Large necrotic lobulated sarcoma adherent to psoas muscle  PROCEDURE:  Patient was identified in holding area to the operating room and placed supine on the operating room table. General anesthesia was induced. Dr. Rejeana Brock performed a right ureteral stent which will be dictated separately. He was then placed placed supine and his abdomen was prepped and draped in sterile fashion. Timeouts performed according to the surgical safety checklist. When all was correct, we continued.  The previous right lower quadrant incision was opened up with a #10 blade. The subcutaneous tissues and dry with the cautery. The muscles were entered with the cautery as well. The fascia was opened with the cautery. The mass is mainly identified in the retroperitoneal location. Mass is so large, that the midline incision had to be opened up inferiorly up to the umbilicus. The bowel was adherent anteriorly and this was mobilized medially. The mass was carefully dissected away from the surrounding structures. This was adherent primarily to retroperitoneal fatty tissue, but was intimately associated with the anterior psoas and the psoas tendon.  The harmonic scalpel was used to carefully dissect the surrounding tissues away. Care was taken not to injure any of the bowel that was nearby. The ureteral stent and ureter was anteromedial to the mass. This  was carefully pushed away.  The mass was also adherent to the lateral border of the right common iliac artery. This was dissected away carefully. Fortunately, it was not adherent to the right iliac vein.  The dissection was extremely difficult due to the size of mass and the dense adhesions of the mass in the retroperitoneum. The retroperitoneum falling into the surgical field.  Once the mass was removed, the ureteral stent was reexamined and was not visible. The ureter was undamaged throughout the course of the surgical field.  Hemostasis was achieved with the cautery and with clips. Large metal clips were placed around the borders of the cavity for radiation purposes. The cavity was irrigated. The tissue was allowed to fall back into the pelvis. The fascia was then closed with #1 looped PDS suture. The lateral aspect was closed in 2 layers. Prior to closure of the fascia, the On-Q catheters were placed bilaterally.  The skin was irrigated and then was closed with staples.  The wound was dressed with a covaderm.  Needle, sponge, and instrument counts were correct times 2.    Patient was taken to recovery room in stable condition.

## 2013-12-14 NOTE — Anesthesia Preprocedure Evaluation (Addendum)
Anesthesia Evaluation  Patient identified by MRN, date of birth, ID band Patient awake    Reviewed: Allergy & Precautions, H&P , NPO status , Patient's Chart, lab work & pertinent test results  Airway Mallampati: II TM Distance: >3 FB Neck ROM: Full    Dental no notable dental hx.    Pulmonary former smoker,  breath sounds clear to auscultation  Pulmonary exam normal       Cardiovascular hypertension, Pt. on medications Rhythm:Regular Rate:Normal     Neuro/Psych negative neurological ROS  negative psych ROS   GI/Hepatic negative GI ROS, Neg liver ROS,   Endo/Other  diabetes, Type 2, Oral Hypoglycemic Agents  Renal/GU negative Renal ROS  negative genitourinary   Musculoskeletal negative musculoskeletal ROS (+)   Abdominal   Peds negative pediatric ROS (+)  Hematology negative hematology ROS (+)   Anesthesia Other Findings   Reproductive/Obstetrics negative OB ROS                          Anesthesia Physical Anesthesia Plan  ASA: III  Anesthesia Plan: General   Post-op Pain Management:    Induction: Intravenous  Airway Management Planned: Oral ETT  Additional Equipment:   Intra-op Plan:   Post-operative Plan: Extubation in OR  Informed Consent: I have reviewed the patients History and Physical, chart, labs and discussed the procedure including the risks, benefits and alternatives for the proposed anesthesia with the patient or authorized representative who has indicated his/her understanding and acceptance.   Dental advisory given  Plan Discussed with: CRNA  Anesthesia Plan Comments:         Anesthesia Quick Evaluation

## 2013-12-14 NOTE — H&P (View-Only) (Signed)
Chief Complaint  Patient presents with  . 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 This was discovered when he came to the hospital for a bowel obstruction. 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 His preoperative CT scan as well as his intraoperative exploration did demonstrate a large mass in the retroperitoneum. This was not his primary problem and he did have electrolyte issues from his bowel obstruction so an extensive resection of this mass was not performed.  A core needle biopsy of this did demonstrate a sarcoma. He is not having any significant right-sided pain. He denies any right leg swelling. He has had resolution of his bowel symptoms since his exploratory laparotomy and small bowel resection with anastomosis. He denies nausea, vomiting, diarrhea, or constipation.  Of note, he has had 4 surgeries for Lynch syndrome. He underwent 3 colonic resections and now has an ileorectal anastomosis. He then had the small bowel resection.  He also has a history of prostate cancer.  Past Medical History  Diagnosis Date  . Hypertension   . Hyperlipidemia   . Type 2 diabetes mellitus   . Hypothyroidism   . History of colon cancer NO RECURRENCE    S/P COLECTOMY X3  LAST ONE 1998--  CHEMO COMLETE 1997  . Recurrent prostate carcinoma FOLLOWED BY DR TANNENBAUM    DX 2007  S/P RADIOACTIVE SEED IMPLANTS  . ED (erectile dysfunction) of organic origin   . Right knee DJD   . Eczema   . OA (osteoarthritis)     LEFT KNEE  . Wears glasses   . History of iron deficiency anemia   . History of squamous cell carcinoma excision   . Accelerated hypertension 10/21/2013  . Type II diabetes mellitus 10/21/2013  . Unspecified hypothyroidism 10/21/2013  . Ileus, postoperative 10/21/2013  . 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 Lynch syndrome     Past  Surgical History  Procedure Laterality Date  . Givens capsule study N/A 07/05/2013    Procedure: GIVENS CAPSULE STUDY;  Surgeon: Martin K Johnson, MD;  Location: [facility];  Service: Endoscopy;  Laterality: N/A;  . Radioactive prostate seed implants  OCT 2007  . Knee arthroscopy Bilateral 2006 &  2007  . Tonsillectomy  AS CHILD  . Appendectomy  AGE 30  . Cryoablation N/A 10/01/2013    Procedure: CRYO ABLATION PROSTATE;  Surgeon: Sigmund I Tannenbaum, MD;  Location: [facility];  Service: Urology;  Laterality: N/A;  . Prostate surgery    . Right colectomy  09/07/1995  . Sigmoidectomy  11/13/95  . Total colectomy  12/29/1997    ileorectal anastomosis  . Laparotomy N/A 10/14/2013    Procedure: EXPLORATORY LAPAROTOMY;  Surgeon: Eric M Wilson, MD;  Location: [facility];  Service: General;  Laterality: N/A;  . Bowel resection N/A 10/14/2013    Procedure: SMALL BOWEL RESECTION;  Surgeon: Eric M Wilson, MD;  Location: [facility];  Service: General;  Laterality: N/A;  . Lysis of adhesion N/A 10/14/2013    Procedure: LYSIS OF ADHESION;  Surgeon: Eric M Wilson, MD;  Location: [facility];  Service: General;  Laterality: N/A;    Current Outpatient Prescriptions  Medication Sig Dispense Refill  . acetaminophen (TYLENOL) 325 MG tablet Take 2 tablets (650 mg total) by mouth every 6 (six) hours as needed for   mild pain, moderate pain, fever or headache.      . amLODipine-benazepril (LOTREL) 5-10 MG per capsule Take 1 capsule by mouth every morning.      . ferrous sulfate 325 (65 FE) MG tablet Take 325 mg by mouth 2 (two) times daily with a meal.      . glipiZIDE (GLUCOTROL) 5 MG tablet Take 5 mg by mouth daily before breakfast.      . levothyroxine (SYNTHROID, LEVOTHROID) 50 MCG tablet Take 50 mcg by mouth daily before breakfast.      . Multiple Vitamin (MULTIVITAMIN WITH MINERALS) TABS tablet Take 1 tablet by mouth daily.      . diphenoxylate-atropine (LOMOTIL) 2.5-0.025 MG per tablet Take 1 tablet by mouth  4 (four) times daily as needed for diarrhea or loose stools.       No current facility-administered medications for this visit.     No Known Allergies   Family History  Problem Relation Age of Onset  . Heart failure Mother   . Heart failure Father   . Ovarian cancer Maternal Aunt   . Uterine cancer Paternal Aunt 71  . Colon cancer Paternal Aunt 87  . Cancer Paternal Aunt 84    sebaceous adenoma; Muir-Torre syndrome  . Colon cancer Paternal Grandmother   . Colon cancer Brother 53  . Colon cancer Cousin 59  . Uterine cancer Cousin 53  . Colon polyps Cousin   . Brain cancer Cousin 17    astrocytoma; Mary's daughter  . Colon polyps Cousin   . Lung cancer Cousin 61  . Breast cancer Cousin 62  . Uterine cancer Cousin 49  . Colon cancer Cousin 59     History   Social History  . Marital Status: Married    Spouse Name: Cindy    Number of Children: 3  . Years of Education: N/A   Occupational History  .     Social History Main Topics  . Smoking status: Former Smoker -- 0.50 packs/day for 15 years    Types: Cigarettes    Quit date: 09/24/1998  . Smokeless tobacco: Never Used  . Alcohol Use: Yes     Comment: OCCASIONAL  . Drug Use: No  . Sexual Activity: Yes   Other Topics Concern  . None   Social History Narrative   Married, wife Cynthia for 40+ years   Mortgage industry-employed by [employer]   # 3 children-#2 girls and #1 boy   #4 grandchildren (boys)   No pets or hobbies (used to play golf)     REVIEW OF SYSTEMS - PERTINENT POSITIVES ONLY: 12 point review of systems negative other than HPI and PMH  EXAM: Filed Vitals:   11/29/13 0940  BP: 112/74  Pulse: 77  Temp: 97.5 F (36.4 C)  Resp: 16    Wt Readings from Last 3 Encounters:  11/29/13 207 lb (93.895 kg)  11/19/13 205 lb 11.2 oz (93.305 kg)  11/16/13 203 lb 6.4 oz (92.262 kg)     Gen:  No acute distress.  Well nourished and well groomed.   Neurological: Alert and oriented to person,  place, and time. Coordination normal.  Head: Normocephalic and atraumatic.  Eyes: Conjunctivae are normal. Pupils are equal, round, and reactive to light. No scleral icterus.  Neck: Normal range of motion. Neck supple. No tracheal deviation or thyromegaly present.  Cardiovascular: Normal rate, regular rhythm, normal heart sounds and intact distal pulses.  Exam reveals no gallop and no friction rub.  No   murmur heard. Respiratory: Effort normal.  No respiratory distress. No chest wall tenderness. Breath sounds normal.  No wheezes, rales or rhonchi.  GI: Soft. Bowel sounds are normal. The abdomen is soft and nontender.  There is no rebound and no guarding. Midline incision has healed, but scar is very tough.  No mass palpable in right pelvis.   Musculoskeletal: Normal range of motion. Extremities are nontender.  Lymphadenopathy: No cervical, preauricular, postauricular or axillary adenopathy is present Skin: Skin is warm and dry. No rash noted. No diaphoresis. No erythema. No pallor. No clubbing, cyanosis, or edema.   Psychiatric: Normal mood and affect. Behavior is normal. Judgment and thought content normal.    LABORATORY RESULTS: Available labs are reviewed  Pathology Retroperitoneal mass, biopsy, RLQ - SPINDLE CELL NEOPLASM CONSISTENT WITH SARCOMA.  Lab Results  Component Value Date   WBC 13.6* 11/16/2013   HGB 9.9* 11/16/2013   HCT 31.8* 11/16/2013   MCV 72.9* 11/16/2013   PLT 417* 11/16/2013   Lab Results  Component Value Date   CREATININE 0.9 11/16/2013    CMET, coags normal.   RADIOLOGY RESULTS: See E-Chart or I-Site for most recent results.  Images and reports are reviewed.  Ct Chest Wo Contrast  11/15/2013   CLINICAL DATA:  Small bowel adenocarcinoma in February of 2015. . History colon cancer in 1997. Prostate cancer 7 years ago.  EXAM: CT CHEST WITHOUT CONTRAST  TECHNIQUE: Multidetector CT imaging of the chest was performed following the standard protocol without IV  contrast.  COMPARISON:  Abdomen and pelvis CT from 10/12/2013.  FINDINGS: There is no axillary lymphadenopathy. 8 mm short axis left axillary lymph node is evident. No mediastinal or hilar lymphadenopathy. The heart size is normal. Coronary artery calcification is noted. No pericardial or pleural effusion.  No evidence for focal airspace consolidation. No pulmonary edema. Linear scarring in the anterior right middle lobe on image 41 is unchanged. Calcified granuloma in the posterior left costophrenic sulcus is stable. 2 mm nodular density on the left major fissure is in a location that was not included in the field-of-view on the prior study. Nevertheless, this is probably a tiny subpleural lymph node.  Bone windows reveal no worrisome lytic or sclerotic osseous lesions. Several nonacute left-sided rib fractures are evident.  IMPRESSION: No findings suspicious for metastatic disease in the chest. 2 mm nodular density in the left lung is likely a subpleural lymph node. Attention on followup imaging is recommended.   Electronically Signed   By: Eric  Mansell M.D.   On: 11/15/2013 14:31   Ct Biopsy  11/15/2013   INDICATION: History of prostate cancer, adenocarcinoma of the small bowel and Lynch syndrome, now with indeterminate mass within the right lower abdominal quadrant.  EXAM: CT BIOPSY OF INDETERMINATE MASS WITHIN THE RIGHT LOWER ABDOMINAL QUADRANT  COMPARISON:  CT CHEST W/O CM dated 11/15/2013; CT ABD/PELV WO CM dated 10/12/2013  MEDICATIONS: Fentanyl 100 mcg IV; Versed 2 mg IV  ANESTHESIA/SEDATION: Sedation time  16 minutes  CONTRAST:  None  COMPLICATIONS: None immediate  PROCEDURE: Informed consent was obtained from the patient following an explanation of the procedure, risks, benefits and alternatives. A time out was performed prior to the initiation of the procedure.  The patient was positioned supine on the CT table and a limited CT was performed for procedural planning demonstrating grossly unchanged  appearance of infiltrate of approximately 12.5 x 9.1 cm mass within the right lower abdominal quadrant (image 10, series 2). The procedure was planned. The   operative site was prepped and draped in the usual sterile fashion. Appropriate trajectory was confirmed with a 22 gauge spinal needle after the adjacent tissues were anesthetized with 1% Lidocaine with epinephrine.  Under intermittent CT guidance, a 17 gauge coaxial needle was advanced into the peripheral aspect of the mass. Positioning was confirmed 4 samples were obtained with an 18 gauge core needle biopsy device. Samples were placed in saline. The tract was embolized with a small amount of Gel-Foam. The co-axial needle was removed and hemostasis was achieved with manual compression.  A dressing was placed. The patient tolerated the procedure well without immediate postprocedural complication.  IMPRESSION: Technically successful CT guided core needle biopsy of infiltrative mass within the right lower abdominal quadrant.   Electronically Signed   By: John  Watts M.D.   On: 11/15/2013 15:44      ASSESSMENT AND PLAN: Retroperitoneal sarcoma Patient has a history of Lynch syndrome and a recent small bowel adenocarcinoma in addition to the new sarcoma.   The retroperitoneal sarcoma will need to be resected. It does not appear to involve any adjacent structures, but does have close proximity to the ureter. I will ask urology to place a ureteral stent prior to resection.   We will plan to do a right lower quadrant incision and try to do retroperitoneal approach.  This may be difficult since he has had prior right colectomy and retroperitoneum is likely violated.  His previous midline incision took quite a bit of time to heal, and he has had 4 surgeries via this incision.    I discussed the risks of bleeding, infection, hernia, damage to adjacent structures, possible need to take out additional structures if adherent to mass, and risk of general  complications like VTE, cardiac or pulmonary issues, urinary retention, etc.    I discussed that we would not do a bowel prep.   I also reviewed that he may need additional treatment like radiation if he has high grade tumor or close/positive margins.    30 min spent in evaluation, examination, coordination of care and counseling.  >50% of time spent in counseling.      Marlie Kuennen L Mirelle Biskup MD Surgical Oncology, General and Endocrine Surgery [facility]      Visit Diagnoses: 1. Retroperitoneal sarcoma     Primary Care Physician: OSBORNE,JAMES CHARLES, MD  SHERRILL, BRAD WILSON,ERIC   

## 2013-12-14 NOTE — Anesthesia Postprocedure Evaluation (Signed)
  Anesthesia Post-op Note  Patient: Dean Owens  Procedure(s) Performed: Procedure(s) (LRB): EXPLORATORY LAPAROTOMY AND RESECTION OF RETROPERITONEAL SARCOMA (N/A) CYSTOSCOPY WITH RIGHT RETROGRADE PYELOGRAM / INSERTION RIGHT URETERAL STENT (Right)  Patient Location: PACU  Anesthesia Type: General  Level of Consciousness: awake and alert   Airway and Oxygen Therapy: Patient Spontanous Breathing  Post-op Pain: mild  Post-op Assessment: Post-op Vital signs reviewed, Patient's Cardiovascular Status Stable, Respiratory Function Stable, Patent Airway and No signs of Nausea or vomiting  Last Vitals:  Filed Vitals:   12/14/13 1930  BP: 144/64  Pulse: 86  Temp: 36.7 C  Resp: 19    Post-op Vital Signs: stable   Complications: No apparent anesthesia complications

## 2013-12-14 NOTE — Interval H&P Note (Signed)
History and Physical Interval Note:  12/14/2013 12:58 PM  Dean Owens  has presented today for surgery, with the diagnosis of retroperitoneal sarcoma  The various methods of treatment have been discussed with the patient and family. After consideration of risks, benefits and other options for treatment, the patient has consented to  Procedure(s): EXPLORATORY LAPAROTOMY AND RESECTION OF RETROPERITONEAL SARCOMA (N/A) CYSTOSCOPY WITH BILATERAL STENT PLACEMENT (N/A) as a surgical intervention .  The patient's history has been reviewed, patient examined, no change in status, stable for surgery.  I have reviewed the patient's chart and labs.  Questions were answered to the patient's satisfaction.     [name]

## 2013-12-15 ENCOUNTER — Encounter (HOSPITAL_COMMUNITY): Payer: Self-pay | Admitting: General Surgery

## 2013-12-15 DIAGNOSIS — C499 Malignant neoplasm of connective and soft tissue, unspecified: Secondary | ICD-10-CM

## 2013-12-15 HISTORY — DX: Malignant neoplasm of connective and soft tissue, unspecified: C49.9

## 2013-12-15 LAB — GLUCOSE, CAPILLARY
GLUCOSE-CAPILLARY: 254 mg/dL — AB (ref 70–99)
GLUCOSE-CAPILLARY: 135 mg/dL — AB (ref 70–99)
GLUCOSE-CAPILLARY: 119 mg/dL — AB (ref 70–99)
GLUCOSE-CAPILLARY: 118 mg/dL — AB (ref 70–99)
Glucose-Capillary: 172 mg/dL — ABNORMAL HIGH (ref 70–99)

## 2013-12-15 LAB — BASIC METABOLIC PANEL
BUN: 13 mg/dL (ref 6–23)
CO2: 23 mEq/L (ref 19–32)
CREATININE: 0.86 mg/dL (ref 0.50–1.35)
Calcium: 8.9 mg/dL (ref 8.4–10.5)
Chloride: 99 mEq/L (ref 96–112)
GFR calc Af Amer: >90 mL/min (ref >90)
GFR, EST NON AFRICAN AMERICAN: 88 mL/min — AB (ref >90)
Glucose, Bld: 295 mg/dL — ABNORMAL HIGH (ref 70–99)
Potassium: 5.3 mEq/L (ref 3.7–5.3)
Sodium: 132 mEq/L — ABNORMAL LOW (ref 137–147)

## 2013-12-15 LAB — CBC
HEMATOCRIT: 30.4 % — AB (ref 39.0–52.0)
HEMOGLOBIN: 9.6 g/dL — AB (ref 13.0–17.0)
MCH: 23.9 pg — AB (ref 26.0–34.0)
MCHC: 31.6 g/dL (ref 30.0–36.0)
MCV: 75.8 fL — AB (ref 78.0–100.0)
Platelets: 336 10*3/uL (ref 150–400)
RBC: 4.01 MIL/uL — ABNORMAL LOW (ref 4.22–5.81)
RDW: 20.5 % — AB (ref 11.5–15.5)
WBC: 10.5 10*3/uL (ref 4.0–10.5)

## 2013-12-15 LAB — PROTIME-INR
INR: 1.24 (ref 0.00–1.49)
Prothrombin Time: 15.3 seconds — ABNORMAL HIGH (ref 11.6–15.2)

## 2013-12-15 LAB — MAGNESIUM: Magnesium: 1.5 mg/dL (ref 1.5–2.5)

## 2013-12-15 LAB — PHOSPHORUS: Phosphorus: 4.6 mg/dL (ref 2.3–4.6)

## 2013-12-15 MED ORDER — AMLODIPINE BESY-BENAZEPRIL HCL 5-10 MG PO CAPS: 1.0000 | ORAL_CAPSULE | Freq: Every morning | ORAL | Status: DC

## 2013-12-15 MED ORDER — BENAZEPRIL HCL 10 MG PO TABS
10.0000 mg | ORAL_TABLET | Freq: Every day | ORAL | Status: DC
  Administered 2013-12-15 – 2013-12-22 (×6): 10 mg via ORAL
  Filled 2013-12-15 (×8): qty 1

## 2013-12-15 MED ORDER — AMLODIPINE BESYLATE 5 MG PO TABS
5.0000 mg | ORAL_TABLET | Freq: Every day | ORAL | Status: DC
  Administered 2013-12-15 – 2013-12-22 (×6): 5 mg via ORAL
  Filled 2013-12-15 (×8): qty 1

## 2013-12-15 NOTE — Op Note (Signed)
Preoperative diagnosis: Large right retroperitoneal sarcoma Postoperative diagnosis: Same  Procedure: Cystoscopy with right retrograde pyelography and placement of ureteral stent   Surgeon: [name] M.D.  Anesthesia: Gen.  Indications: Dean Owens is 68 years of age and has a very complex history. He currently has a very large right-sided retroperitoneal sarcoma based on needle biopsy. He currently is without hydronephrosis but given the location of this mass within the retroperitoneum the general surgeons felt that placement of ureteral stent with a greatly and identification of the ureter and avoidance of any injury.     Technique and findings: Patient was brought the operating room where he had successful induction of general endotracheal anesthesia. He was placed in lithotomy position prepped and draped in usual manner. Appropriate surgical timeout was performed. Cystoscopy revealed unremarkable anterior and prostatic urethra. The bladder showed no gross pathology. Right ureteral orifice was identified and a guidewire was placed. Fluoroscopic interpretation was utilized. A 6 French open-ended catheter was placed in the distal ureter. The guidewire was removed retrograde pyelography was done with fluoroscopic interpretation. The ureter appeared recently delicate it did not appear to be grossly distorted or obstructed. With the guidewire back in position the open-ended stent was placed to the right renal pelvis. Foley catheter was then placed in the stent was secured to the catheter. This will be removed by the general surgery service at the completion of the procedure unless there is concerns about ureteral injury at which point we will reassess the situation.   At that juncture the patient was repositioned and prepped for the general surgery procedure which is dictated separately.

## 2013-12-15 NOTE — Progress Notes (Signed)
1 Day Post-Op  Subjective: Pain controlled.  No flatus yet.    Objective: Vital signs in last 24 hours: Temp:  [97.1 F (36.2 C)-98.3 F (36.8 C)] 97.7 F (36.5 C) (04/22 0800) Pulse Rate:  [64-91] 64 (04/22 0800) Resp:  [11-24] 14 (04/22 0800) BP: (124-166)/(56-73) 124/56 mmHg (04/22 0800) SpO2:  [97 %-100 %] 99 % (04/22 0800) FiO2 (%):  [100 %] 100 % (04/22 0036) Weight:  [211 lb 13.8 oz (96.1 kg)] 211 lb 13.8 oz (96.1 kg) (04/21 2000)    Intake/Output from previous day: 04/21 0701 - 04/22 0700 In: 5230.1 [I.V.:4305.1; Blood:250; IV ZOXWRUEAV:409] Out: 2155 [Urine:1505; Blood:650] Intake/Output this shift: Total I/O In: 250 [I.V.:250] Out: -   General appearance: alert, cooperative and no distress Resp: breathing comfortably GI: soft, approp tender, non distended.  Lab Results:   Recent Labs  12/14/13 2033 12/15/13 0324  WBC 16.8* 10.5  HGB 10.0* 9.6*  HCT 31.2* 30.4*  PLT 367 336   BMET  Recent Labs  12/14/13 1939 12/14/13 2033 12/15/13 0324  NA 136*  --  132*  K 4.8  --  5.3  CL 101  --  99  CO2 19  --  23  GLUCOSE 211*  --  295*  BUN 12  --  13  CREATININE 0.92 0.92 0.86  CALCIUM 9.4  --  8.9   PT/INR  Recent Labs  12/15/13 0324  LABPROT 15.3*  INR 1.24   ABG No results found for this basename: PHART, PCO2, PO2, HCO3,  in the last 72 hours  Studies/Results: Dg C-arm 1-60 Min-no Report  12/14/2013   CLINICAL DATA: stent placement   C-ARM 1-60 MINUTES  Fluoroscopy was utilized by the requesting physician.  No radiographic  interpretation.     Anti-infectives: Anti-infectives   Start     Dose/Rate Route Frequency Ordered Stop   12/14/13 2359  ceFAZolin (ANCEF) IVPB 1 g/50 mL premix     1 g 100 mL/hr over 30 Minutes Intravenous Every 6 hours 12/14/13 2004 12/15/13 1759   12/14/13 1004  ceFAZolin (ANCEF) IVPB 2 g/50 mL premix     2 g 100 mL/hr over 30 Minutes Intravenous On call to O.R. 12/14/13 1004 12/14/13 1815       Assessment/Plan: s/p Procedure(s): EXPLORATORY LAPAROTOMY AND RESECTION OF RETROPERITONEAL SARCOMA (N/A) CYSTOSCOPY WITH RIGHT RETROGRADE PYELOGRAM / INSERTION RIGHT URETERAL STENT (Right) Advance diet Continue foley due to strict I&O and urinary output monitoring transfer to floor.   Start antihypertensives for HTN.    LOS: 1 day    [name] 12/15/2013

## 2013-12-15 NOTE — Progress Notes (Signed)
Inpatient Diabetes Program Recommendations  AACE/ADA: New Consensus Statement on Inpatient Glycemic Control (2013)  Target Ranges:  Prepandial:   less than 140 mg/dL      Peak postprandial:   less than 180 mg/dL (1-2 hours)      Critically ill patients:  140 - 180 mg/dL   Reason for Visit: Hyperglycemia  Diabetes history: DM2 Outpatient Diabetes medications: glipizide 5 mg QAM Current orders for Inpatient glycemic control: Novolog sensitive tidwc  Results for Dean Owens, Dean Owens (MRN [id number]) as of 12/15/2013 10:59  Ref. Range 12/14/2013 10:35 12/14/2013 18:35 12/14/2013 22:40  Glucose-Capillary Latest Range: 70-99 mg/dL 134 (H) 182 (H) 229 (H)  Results for Dean Owens, Dean Owens (MRN [id number]) as of 12/15/2013 10:59  Ref. Range 12/07/2013 11:05  Hemoglobin A1C Latest Range: <5.7 % 6.6 (H)   Please consider Novolog sensitive Q4H while NPO. Appears to have good glycemic control at home.  Will continue to follow. Thank you. Lorenda Peck, RD, LDN, CDE Inpatient Diabetes Coordinator [phone]

## 2013-12-16 LAB — BASIC METABOLIC PANEL
BUN: 12 mg/dL (ref 6–23)
CO2: 23 mEq/L (ref 19–32)
Calcium: 8.5 mg/dL (ref 8.4–10.5)
Chloride: 105 mEq/L (ref 96–112)
Creatinine, Ser: 0.72 mg/dL (ref 0.50–1.35)
Glucose, Bld: 127 mg/dL — ABNORMAL HIGH (ref 70–99)
POTASSIUM: 4.5 meq/L (ref 3.7–5.3)
Sodium: 137 mEq/L (ref 137–147)

## 2013-12-16 LAB — CBC
HCT: 26.8 % — ABNORMAL LOW (ref 39.0–52.0)
Hemoglobin: 8.4 g/dL — ABNORMAL LOW (ref 13.0–17.0)
MCH: 24.2 pg — ABNORMAL LOW (ref 26.0–34.0)
MCHC: 31.3 g/dL (ref 30.0–36.0)
MCV: 77.2 fL — ABNORMAL LOW (ref 78.0–100.0)
PLATELETS: 292 10*3/uL (ref 150–400)
RBC: 3.47 MIL/uL — ABNORMAL LOW (ref 4.22–5.81)
RDW: 21.2 % — AB (ref 11.5–15.5)
WBC: 9.8 10*3/uL (ref 4.0–10.5)

## 2013-12-16 LAB — GLUCOSE, CAPILLARY
GLUCOSE-CAPILLARY: 105 mg/dL — AB (ref 70–99)
GLUCOSE-CAPILLARY: 132 mg/dL — AB (ref 70–99)
Glucose-Capillary: 104 mg/dL — ABNORMAL HIGH (ref 70–99)
Glucose-Capillary: 139 mg/dL — ABNORMAL HIGH (ref 70–99)

## 2013-12-16 MED ORDER — ALUM & MAG HYDROXIDE-SIMETH 200-200-20 MG/5ML PO SUSP
30.0000 mL | Freq: Four times a day (QID) | ORAL | Status: DC | PRN
  Administered 2013-12-16 – 2013-12-21 (×4): 30 mL via ORAL
  Filled 2013-12-16 (×5): qty 30

## 2013-12-16 NOTE — Progress Notes (Signed)
Patient ID: Dean Owens, male   DOB: 06/10/1946, 68 y.o.   MRN: [id number] 2 Days Post-Op  Subjective: Doing ok.  No nausea or flatus.    Objective: Vital signs in last 24 hours: Temp:  [97.2 F (36.2 C)-98.7 F (37.1 C)] 98 F (36.7 C) (04/23 0519) Pulse Rate:  [69-83] 76 (04/23 0519) Resp:  [11-18] 18 (04/23 0519) BP: (124-143)/(60-73) 127/72 mmHg (04/23 0519) SpO2:  [91 %-100 %] 98 % (04/23 0519) Last BM Date:  (pta)  Intake/Output from previous day: 04/22 0701 - 04/23 0700 In: 2925 [I.V.:2875; IV Piggyback:50] Out: 1250 [Urine:1250] Intake/Output this shift:    General appearance: alert, cooperative and no distress Resp: breathing comfortably GI: soft, approp tender, non distended.  Lab Results:   Recent Labs  12/15/13 0324 12/16/13 0436  WBC 10.5 9.8  HGB 9.6* 8.4*  HCT 30.4* 26.8*  PLT 336 292   BMET  Recent Labs  12/15/13 0324 12/16/13 0436  NA 132* 137  K 5.3 4.5  CL 99 105  CO2 23 23  GLUCOSE 295* 127*  BUN 13 12  CREATININE 0.86 0.72  CALCIUM 8.9 8.5   PT/INR  Recent Labs  12/15/13 0324  LABPROT 15.3*  INR 1.24   ABG No results found for this basename: PHART, PCO2, PO2, HCO3,  in the last 72 hours  Studies/Results: Dg C-arm 1-60 Min-no Report  12/14/2013   CLINICAL DATA: stent placement   C-ARM 1-60 MINUTES  Fluoroscopy was utilized by the requesting physician.  No radiographic  interpretation.     Anti-infectives: Anti-infectives   Start     Dose/Rate Route Frequency Ordered Stop   12/14/13 2359  ceFAZolin (ANCEF) IVPB 1 g/50 mL premix     1 g 100 mL/hr over 30 Minutes Intravenous Every 6 hours 12/14/13 2004 12/15/13 1759   12/14/13 1004  ceFAZolin (ANCEF) IVPB 2 g/50 mL premix     2 g 100 mL/hr over 30 Minutes Intravenous On call to O.R. 12/14/13 1004 12/14/13 1815      Assessment/Plan: s/p Procedure(s): EXPLORATORY LAPAROTOMY AND RESECTION OF RETROPERITONEAL SARCOMA (N/A) CYSTOSCOPY WITH RIGHT RETROGRADE PYELOGRAM  / INSERTION RIGHT URETERAL STENT (Right) Advance diet Decrease IVF Ambulate IS D/c foley   Start antihypertensives for HTN.    LOS: 2 days    [name] 12/16/2013

## 2013-12-17 ENCOUNTER — Inpatient Hospital Stay (HOSPITAL_COMMUNITY): Payer: Medicare Other

## 2013-12-17 LAB — BASIC METABOLIC PANEL
BUN: 7 mg/dL (ref 6–23)
CO2: 26 mEq/L (ref 19–32)
CREATININE: 0.74 mg/dL (ref 0.50–1.35)
Calcium: 8.8 mg/dL (ref 8.4–10.5)
Chloride: 95 mEq/L — ABNORMAL LOW (ref 96–112)
GFR calc Af Amer: >90 mL/min (ref >90)
Glucose, Bld: 135 mg/dL — ABNORMAL HIGH (ref 70–99)
Potassium: 4.4 mEq/L (ref 3.7–5.3)
SODIUM: 131 meq/L — AB (ref 137–147)

## 2013-12-17 LAB — CBC
HCT: 28.9 % — ABNORMAL LOW (ref 39.0–52.0)
Hemoglobin: 9.3 g/dL — ABNORMAL LOW (ref 13.0–17.0)
MCH: 24.4 pg — AB (ref 26.0–34.0)
MCHC: 32.2 g/dL (ref 30.0–36.0)
MCV: 75.9 fL — AB (ref 78.0–100.0)
PLATELETS: 330 10*3/uL (ref 150–400)
RBC: 3.81 MIL/uL — ABNORMAL LOW (ref 4.22–5.81)
RDW: 20.6 % — ABNORMAL HIGH (ref 11.5–15.5)
WBC: 11.1 10*3/uL — ABNORMAL HIGH (ref 4.0–10.5)

## 2013-12-17 LAB — GLUCOSE, CAPILLARY
Glucose-Capillary: 150 mg/dL — ABNORMAL HIGH (ref 70–99)
Glucose-Capillary: 162 mg/dL — ABNORMAL HIGH (ref 70–99)
Glucose-Capillary: 189 mg/dL — ABNORMAL HIGH (ref 70–99)
Glucose-Capillary: 187 mg/dL — ABNORMAL HIGH (ref 70–99)

## 2013-12-17 MED ORDER — PROMETHAZINE HCL 25 MG/ML IJ SOLN
12.5000 mg | Freq: Four times a day (QID) | INTRAMUSCULAR | Status: DC | PRN
  Administered 2013-12-17 – 2013-12-19 (×4): 12.5 mg via INTRAVENOUS
  Filled 2013-12-17 (×4): qty 1

## 2013-12-17 MED ORDER — BUPIVACAINE 0.25 % ON-Q PUMP DUAL CATH 300 ML
300.0000 mL | INJECTION | Status: DC
  Filled 2013-12-17: qty 300

## 2013-12-17 NOTE — Progress Notes (Signed)
  Patient ID: Dean Owens, male   DOB: 06-15-1946, 68 y.o.   MRN: [id number] 3 Days Post-Op   Subjective: Some nausea today.  Objective: Vital signs in last 24 hours: Temp:  [98.5 F (36.9 C)-99.3 F (37.4 C)] 98.5 F (36.9 C) (04/24 0510) Pulse Rate:  [89] 89 (04/24 0510) Resp:  [13-22] 14 (04/24 0800) BP: (124-130)/(64-65) 124/65 mmHg (04/24 0510) SpO2:  [96 %-100 %] 96 % (04/24 0800) Last BM Date: 12/13/13  Intake/Output from previous day: 04/23 0701 - 04/24 0700 In: 2407.9 [P.O.:360; I.V.:2047.9] Out: 1175 [Urine:1175] Intake/Output this shift:    General appearance: alert, cooperative and no distress Resp: breathing comfortably GI: soft, approp tender, distended.  Lab Results:   Recent Labs  12/16/13 0436 12/17/13 0421  WBC 9.8 11.1*  HGB 8.4* 9.3*  HCT 26.8* 28.9*  PLT 292 330   BMET  Recent Labs  12/16/13 0436 12/17/13 0421  NA 137 131*  K 4.5 4.4  CL 105 95*  CO2 23 26  GLUCOSE 127* 135*  BUN 12 7  CREATININE 0.72 0.74  CALCIUM 8.5 8.8   PT/INR  Recent Labs  12/15/13 0324  LABPROT 15.3*  INR 1.24   ABG No results found for this basename: PHART, PCO2, PO2, HCO3,  in the last 72 hours  Studies/Results: No results found.  Anti-infectives: Anti-infectives   Start     Dose/Rate Route Frequency Ordered Stop   12/14/13 2359  ceFAZolin (ANCEF) IVPB 1 g/50 mL premix     1 g 100 mL/hr over 30 Minutes Intravenous Every 6 hours 12/14/13 2004 12/15/13 1759   12/14/13 1004  ceFAZolin (ANCEF) IVPB 2 g/50 mL premix     2 g 100 mL/hr over 30 Minutes Intravenous On call to O.R. 12/14/13 1004 12/14/13 1815      Assessment/Plan: s/p Procedure(s): EXPLORATORY LAPAROTOMY AND RESECTION OF RETROPERITONEAL SARCOMA (N/A) CYSTOSCOPY WITH RIGHT RETROGRADE PYELOGRAM / INSERTION RIGHT URETERAL STENT (Right) Ileus.  Hold on clears. Decrease IVF Ambulate IS  Await pathology Start antihypertensives for HTN.    LOS: 3 days    [name] 12/17/2013

## 2013-12-18 LAB — BASIC METABOLIC PANEL
BUN: 18 mg/dL (ref 6–23)
CHLORIDE: 95 meq/L — AB (ref 96–112)
CO2: 29 meq/L (ref 19–32)
Calcium: 9.5 mg/dL (ref 8.4–10.5)
Creatinine, Ser: 1.24 mg/dL (ref 0.50–1.35)
GFR calc Af Amer: 68 mL/min — ABNORMAL LOW (ref >90)
GFR, EST NON AFRICAN AMERICAN: 58 mL/min — AB (ref >90)
Glucose, Bld: 218 mg/dL — ABNORMAL HIGH (ref 70–99)
Potassium: 4.4 mEq/L (ref 3.7–5.3)
SODIUM: 137 meq/L (ref 137–147)

## 2013-12-18 LAB — CBC
HCT: 31.7 % — ABNORMAL LOW (ref 39.0–52.0)
HEMOGLOBIN: 10.1 g/dL — AB (ref 13.0–17.0)
MCH: 23.9 pg — ABNORMAL LOW (ref 26.0–34.0)
MCHC: 31.9 g/dL (ref 30.0–36.0)
MCV: 75.1 fL — ABNORMAL LOW (ref 78.0–100.0)
Platelets: 461 10*3/uL — ABNORMAL HIGH (ref 150–400)
RBC: 4.22 MIL/uL (ref 4.22–5.81)
RDW: 21 % — ABNORMAL HIGH (ref 11.5–15.5)
WBC: 15.1 10*3/uL — AB (ref 4.0–10.5)

## 2013-12-18 LAB — TYPE AND SCREEN
ABO/RH(D): O POS
ANTIBODY SCREEN: NEGATIVE
Unit division: 0
Unit division: 0

## 2013-12-18 LAB — GLUCOSE, CAPILLARY
Glucose-Capillary: 222 mg/dL — ABNORMAL HIGH (ref 70–99)
Glucose-Capillary: 201 mg/dL — ABNORMAL HIGH (ref 70–99)
Glucose-Capillary: 176 mg/dL — ABNORMAL HIGH (ref 70–99)
Glucose-Capillary: 167 mg/dL — ABNORMAL HIGH (ref 70–99)

## 2013-12-18 NOTE — Progress Notes (Signed)
General Surgery Note  LOS: 4 days  POD -  4 Days Post-Op  Assessment/Plan: 1.  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  2. DVT prophylaxis - SQ Heparin 3. On thyroid replacement 4.  WBC and glucose up some  Will recheck again tomorrow.   Active Problems:   Sarcoma of retroperitoneum  Subjective:  Feels better with "ileus regimen".  Has passed small flatus, but no BM.   Objective:   Filed Vitals:   12/18/13 0624  BP: 137/65  Pulse: 59  Temp: 98 F (36.7 C)  Resp: 19     Intake/Output from previous day:  04/24 0701 - 04/25 0700 In: 1166.7 [I.V.:1166.7] Out: 202 [Urine:201; Stool:1]  Intake/Output this shift:      Physical Exam:   General: WN WM who is alert and oriented.    HEENT: Normal. Pupils equal. .   Lungs: Clear.  Does 1,500+ with IS.   Abdomen: Mild distention.  High pitched BS.   Wound: Dressing intact.   Lab Results:    Recent Labs  12/17/13 0421 12/18/13 0402  WBC 11.1* 15.1*  HGB 9.3* 10.1*  HCT 28.9* 31.7*  PLT 330 461*    BMET   Recent Labs  12/17/13 0421 12/18/13 0402  NA 131* 137  K 4.4 4.4  CL 95* 95*  CO2 26 29  GLUCOSE 135* 218*  BUN 7 18  CREATININE 0.74 1.24  CALCIUM 8.8 9.5    PT/INR  No results found for this basename: LABPROT, INR,  in the last 72 hours  ABG  No results found for this basename: PHART, PCO2, PO2, HCO3,  in the last 72 hours   Studies/Results:  Dg Abd 2 Views  12/17/2013   CLINICAL DATA:  Vomiting and abdominal distension post exploratory laparotomy and resection of retroperitoneal sarcoma on 12/14/2013; past history of colon cancer, prostate cancer, Lynch syndrome, appendectomy, sigmoidectomy, colectomy, smaller resection, hypertension, diabetes  EXAM: ABDOMEN - 2 VIEW   COMPARISON:  CT abdomen and pelvis 10/12/2013, abdominal radiographs 10/11/2013  FINDINGS: Free intraperitoneal air.  Air-filled loops of large and small bowel question ileus.  Numerous surgical clips in the right abdomen new since previous exam.  No definite bowel wall thickening.  Bones unremarkable.  Brachytherapy seed implants in prostate bed.  No acute osseous findings.  IMPRESSION: Free intraperitoneal air, which could be related to abdominal surgery 3 days ago.  Air-filled loops of large and small bowel throughout abdomen suggesting postoperative ileus.  No evidence of bowel wall thickening.   Electronically Signed   By: Lavonia Dana M.D.   On: 12/17/2013 23:44     Anti-infectives:   Anti-infectives   Start     Dose/Rate Route Frequency Ordered Stop   12/14/13 2359  ceFAZolin (ANCEF) IVPB 1 g/50 mL premix     1 g 100 mL/hr over 30 Minutes Intravenous Every 6 hours 12/14/13 2004 12/15/13 1759   12/14/13 1004  ceFAZolin (ANCEF) IVPB 2 g/50 mL premix     2 g 100 mL/hr over 30 Minutes Intravenous On call to O.R. 12/14/13 1004 12/14/13 1815      Alphonsa Overall, MD, FACS Pager: [facility] Office: [phone] 12/18/2013

## 2013-12-18 NOTE — Progress Notes (Signed)
Patient refused NG tube.  Patient stated, "I feel better".  Patient preferred to ambulate and was able to burp and accepted to sit on a rocking chair for a while. Patient's abdomen now is palpable and non-taut. No further vomiting.  Will continue to monitor

## 2013-12-18 NOTE — Progress Notes (Signed)
Pt nauseated off and on this 12 hour shift. Antiemetics "help but do not completely relieve  Nausea. Pt refused ng during night but now desires gastric tube to relieve nausea . #16  Ng to left nare. Placement checked by myself and Margie Billet. 1950cc green liquid immediate return. Pt tolerated well

## 2013-12-19 LAB — CBC
HCT: 29.5 % — ABNORMAL LOW (ref 39.0–52.0)
Hemoglobin: 9.1 g/dL — ABNORMAL LOW (ref 13.0–17.0)
MCH: 24.1 pg — ABNORMAL LOW (ref 26.0–34.0)
MCHC: 30.8 g/dL (ref 30.0–36.0)
MCV: 78 fL (ref 78.0–100.0)
Platelets: 409 10*3/uL — ABNORMAL HIGH (ref 150–400)
RBC: 3.78 MIL/uL — ABNORMAL LOW (ref 4.22–5.81)
RDW: 21.2 % — ABNORMAL HIGH (ref 11.5–15.5)
WBC: 10.8 10*3/uL — ABNORMAL HIGH (ref 4.0–10.5)

## 2013-12-19 LAB — BASIC METABOLIC PANEL
BUN: 27 mg/dL — ABNORMAL HIGH (ref 6–23)
CALCIUM: 9 mg/dL (ref 8.4–10.5)
CHLORIDE: 96 meq/L (ref 96–112)
CO2: 33 mEq/L — ABNORMAL HIGH (ref 19–32)
CREATININE: 1.33 mg/dL (ref 0.50–1.35)
GFR calc non Af Amer: 54 mL/min — ABNORMAL LOW (ref >90)
GFR, EST AFRICAN AMERICAN: 62 mL/min — AB (ref >90)
Glucose, Bld: 167 mg/dL — ABNORMAL HIGH (ref 70–99)
Potassium: 4.5 mEq/L (ref 3.7–5.3)
Sodium: 139 mEq/L (ref 137–147)

## 2013-12-19 LAB — GLUCOSE, CAPILLARY
GLUCOSE-CAPILLARY: 155 mg/dL — AB (ref 70–99)
Glucose-Capillary: 168 mg/dL — ABNORMAL HIGH (ref 70–99)
Glucose-Capillary: 159 mg/dL — ABNORMAL HIGH (ref 70–99)

## 2013-12-19 MED ORDER — INSULIN ASPART 100 UNIT/ML ~~LOC~~ SOLN
0.0000 [IU] | Freq: Four times a day (QID) | SUBCUTANEOUS | Status: DC
  Administered 2013-12-19 – 2013-12-20 (×5): 2 [IU] via SUBCUTANEOUS
  Administered 2013-12-20 (×2): 1 [IU] via SUBCUTANEOUS
  Administered 2013-12-21: 2 [IU] via SUBCUTANEOUS

## 2013-12-19 MED ORDER — LEVOTHYROXINE SODIUM 100 MCG IV SOLR
25.0000 ug | Freq: Every day | INTRAVENOUS | Status: DC
  Administered 2013-12-19 – 2013-12-21 (×3): 25 ug via INTRAVENOUS
  Filled 2013-12-19 (×3): qty 5

## 2013-12-19 NOTE — Progress Notes (Signed)
General Surgery Note  LOS: 5 days  POD -  5 Days Post-Op  Assessment/Plan: 1.  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  Plan:  to increase IVF, check labs in AM, check KUB in AM  2.  DVT prophylaxis - SQ Heparin 3.  On thyroid replacement - switched to IV synthroid 4.  Diabetes - on SSI   Active Problems:   Sarcoma of retroperitoneum  Subjective:  Now had NGT.  Feels better.  Is passing small amounts of flatus.   Objective:   Filed Vitals:   12/19/13 0624  BP: 116/76  Pulse: 68  Temp: 98.5 F (36.9 C)  Resp: 18     Intake/Output from previous day:  04/25 0701 - 04/26 0700 In: 1226.7 [I.V.:1226.7] Out: 5300 [Urine:900; Emesis/NG output:4400]  Intake/Output this shift:      Physical Exam:   General: WN WM who is alert and oriented.    HEENT: Normal. Pupils equal. .   Lungs: Clear.  Does 2,000+ with IS.   Abdomen: Flatter than yesterday.  BS present.   Wound: Dressing intact.  Has OnQ in place.   Lab Results:     Recent Labs  12/18/13 0402 12/19/13 0535  WBC 15.1* 10.8*  HGB 10.1* 9.1*  HCT 31.7* 29.5*  PLT 461* 409*    BMET    Recent Labs  12/18/13 0402 12/19/13 0535  NA 137 139  K 4.4 4.5  CL 95* 96  CO2 29 33*  GLUCOSE 218* 167*  BUN 18 27*  CREATININE 1.24 1.33  CALCIUM 9.5 9.0    PT/INR  No results found for this basename: LABPROT, INR,  in the last 72 hours  ABG  No results found for this basename: PHART, PCO2, PO2, HCO3,  in the last 72 hours   Studies/Results:  Dg Abd 2 Views  12/17/2013   CLINICAL DATA:  Vomiting and abdominal distension post exploratory laparotomy and resection of retroperitoneal sarcoma on 12/14/2013; past history of colon cancer, prostate cancer, Lynch syndrome, appendectomy, sigmoidectomy,  colectomy, smaller resection, hypertension, diabetes  EXAM: ABDOMEN - 2 VIEW  COMPARISON:  CT abdomen and pelvis 10/12/2013, abdominal radiographs 10/11/2013  FINDINGS: Free intraperitoneal air.  Air-filled loops of large and small bowel question ileus.  Numerous surgical clips in the right abdomen new since previous exam.  No definite bowel wall thickening.  Bones unremarkable.  Brachytherapy seed implants in prostate bed.  No acute osseous findings.  IMPRESSION: Free intraperitoneal air, which could be related to abdominal surgery 3 days ago.  Air-filled loops of large and small bowel throughout abdomen suggesting postoperative ileus.  No evidence of bowel wall thickening.   Electronically Signed   By: Lavonia Dana M.D.   On: 12/17/2013 23:44     Anti-infectives:   Anti-infectives   Start     Dose/Rate Route Frequency Ordered Stop   12/14/13 2359  ceFAZolin (ANCEF) IVPB 1 g/50 mL premix     1 g 100 mL/hr over 30 Minutes Intravenous Every 6 hours 12/14/13 2004 12/15/13 1759   12/14/13 1004  ceFAZolin (ANCEF) IVPB 2 g/50 mL premix     2 g 100 mL/hr over 30 Minutes Intravenous On call to O.R. 12/14/13 1004 12/14/13 1815      Alphonsa Overall, MD, FACS Pager: [facility] Office: [phone] 12/19/2013

## 2013-12-20 ENCOUNTER — Encounter: Payer: Medicare Other | Admitting: Genetic Counselor

## 2013-12-20 ENCOUNTER — Inpatient Hospital Stay (HOSPITAL_COMMUNITY): Payer: Medicare Other

## 2013-12-20 ENCOUNTER — Other Ambulatory Visit: Payer: Medicare Other

## 2013-12-20 LAB — CBC
HCT: UNDETERMINED % (ref 39.0–52.0)
HCT: 27.8 % — ABNORMAL LOW (ref 39.0–52.0)
Hemoglobin: UNDETERMINED g/dL (ref 13.0–17.0)
Hemoglobin: 8.7 g/dL — ABNORMAL LOW (ref 13.0–17.0)
MCH: UNDETERMINED pg (ref 26.0–34.0)
MCH: 24.8 pg — ABNORMAL LOW (ref 26.0–34.0)
MCHC: UNDETERMINED g/dL (ref 30.0–36.0)
MCHC: 31.3 g/dL (ref 30.0–36.0)
MCV: UNDETERMINED fL (ref 78.0–100.0)
MCV: 79.2 fL (ref 78.0–100.0)
Platelets: UNDETERMINED 10*3/uL (ref 150–400)
Platelets: 379 10*3/uL (ref 150–400)
RBC: UNDETERMINED MIL/uL (ref 4.22–5.81)
RBC: 3.51 MIL/uL — ABNORMAL LOW (ref 4.22–5.81)
RDW: UNDETERMINED % (ref 11.5–15.5)
RDW: 20.6 % — AB (ref 11.5–15.5)
WBC: UNDETERMINED 10*3/uL (ref 4.0–10.5)
WBC: 7.7 10*3/uL (ref 4.0–10.5)

## 2013-12-20 LAB — GLUCOSE, CAPILLARY
GLUCOSE-CAPILLARY: 189 mg/dL — AB (ref 70–99)
Glucose-Capillary: 130 mg/dL — ABNORMAL HIGH (ref 70–99)
Glucose-Capillary: 145 mg/dL — ABNORMAL HIGH (ref 70–99)
Glucose-Capillary: 158 mg/dL — ABNORMAL HIGH (ref 70–99)
Glucose-Capillary: 168 mg/dL — ABNORMAL HIGH (ref 70–99)

## 2013-12-20 LAB — BASIC METABOLIC PANEL
BUN: UNDETERMINED mg/dL (ref 6–23)
BUN: 23 mg/dL (ref 6–23)
CALCIUM: UNDETERMINED mg/dL (ref 8.4–10.5)
CHLORIDE: 97 meq/L (ref 96–112)
CO2: UNDETERMINED meq/L (ref 19–32)
CO2: 34 mEq/L — ABNORMAL HIGH (ref 19–32)
CREATININE: UNDETERMINED mg/dL (ref 0.50–1.35)
Calcium: 8.9 mg/dL (ref 8.4–10.5)
Chloride: UNDETERMINED mEq/L (ref 96–112)
Creatinine, Ser: 1.11 mg/dL (ref 0.50–1.35)
GFR calc non Af Amer: 67 mL/min — ABNORMAL LOW (ref >90)
GFR, EST AFRICAN AMERICAN: 77 mL/min — AB (ref >90)
GLUCOSE: 167 mg/dL — AB (ref 70–99)
Glucose, Bld: UNDETERMINED mg/dL (ref 70–99)
Potassium: UNDETERMINED mEq/L (ref 3.7–5.3)
Potassium: 4.1 mEq/L (ref 3.7–5.3)
Sodium: UNDETERMINED mEq/L (ref 137–147)
Sodium: 141 mEq/L (ref 137–147)

## 2013-12-20 MED ORDER — MORPHINE SULFATE 2 MG/ML IJ SOLN
1.0000 mg | INTRAMUSCULAR | Status: DC | PRN
  Administered 2013-12-20 – 2013-12-21 (×7): 2 mg via INTRAVENOUS
  Filled 2013-12-20 (×7): qty 1

## 2013-12-20 NOTE — Progress Notes (Signed)
Patient ID: Dean Owens, male   DOB: 1945/09/02, 68 y.o.   MRN: [id number] 6 Days Post-Op   Subjective: Passing a fair amount of gas and had 3 BMs.  No n/v.    Objective: Vital signs in last 24 hours: Temp:  [98 F (36.7 C)-98.2 F (36.8 C)] 98.1 F (36.7 C) 01-18-2023 0525) Pulse Rate:  [52-91] 65 01-18-23 0525) Resp:  [12-20] 20 Jan 18, 2023 0752) BP: (135-145)/(75-80) 135/75 mmHg 2023-01-18 0525) SpO2:  [91 %-100 %] 94 % 2023-01-18 0752) Last BM Date: 12/16/13  Intake/Output from previous day: 04/26 0701 - 01-18-23 0700 In: 2830 [I.V.:2830] Out: 3400 [Urine:650; Emesis/NG output:2750] Intake/Output this shift:    General appearance: alert, cooperative and no distress Resp: breathing comfortably GI: soft, approp tender, non distended.  Tr pinkness on wound.   Lab Results:   Recent Labs  17-Jan-2014 0412 17-Jan-2014 0613  WBC SPECIMEN CONTAMINATED, UNABLE TO PERFORM TEST(S). 7.7  HGB SPECIMEN CONTAMINATED, UNABLE TO PERFORM TEST(S). 8.7*  HCT SPECIMEN CONTAMINATED, UNABLE TO PERFORM TEST(S). 27.8*  PLT SPECIMEN CONTAMINATED, UNABLE TO PERFORM TEST(S). 379   BMET  Recent Labs  2014-01-17 0412 01-17-2014 0613  NA SPECIMEN CONTAMINATED, UNABLE TO PERFORM TEST(S). 141  K SPECIMEN CONTAMINATED, UNABLE TO PERFORM TEST(S). 4.1  CL SPECIMEN CONTAMINATED, UNABLE TO PERFORM TEST(S). 97  CO2 SPECIMEN CONTAMINATED, UNABLE TO PERFORM TEST(S). 34*  GLUCOSE SPECIMEN CONTAMINATED, UNABLE TO PERFORM TEST(S). 167*  BUN SPECIMEN CONTAMINATED, UNABLE TO PERFORM TEST(S). 23  CREATININE SPECIMEN CONTAMINATED, UNABLE TO PERFORM TEST(S). 1.11  CALCIUM SPECIMEN CONTAMINATED, UNABLE TO PERFORM TEST(S). 8.9   PT/INR No results found for this basename: LABPROT, INR,  in the last 72 hours ABG No results found for this basename: PHART, PCO2, PO2, HCO3,  in the last 72 hours  Studies/Results: Dg Abd 2 Views  01-17-2014   CLINICAL DATA:  Recent abdominal surgery. History of small-bowel obstruction. Abdominal pain  and tenderness.  EXAM: ABDOMEN - 2 VIEW  COMPARISON:  Abdominal radiograph 12/17/2013.  FINDINGS: The tip of the nasogastric tube is either in the proximal duodenum or in the pre-pyloric region of the antrum of the stomach. There is some distal colonic and rectal stool. Paucity of colonic gas. Several prominent loops of gas-filled small bowel are noted in the right side of abdomen, measuring up to 4.1 cm in diameter. A small volume of pneumoperitoneum underneath the right hemidiaphragm, decreased from the examination from 3 days ago. Multiple surgical clips throughout the abdomen and skin staples are noted in the right lower quadrant. Brachytherapy implants within the prostate gland.  IMPRESSION: 1. Persistent but decreasing volume of pneumoperitoneum, favored to be postoperative. 2. Nonspecific bowel gas pattern, as above, favor to represent a persistent postoperative ileus. 3. Support apparatus and postoperative changes, as above.   Electronically Signed   By: Vinnie Langton M.D.   On: Jan 17, 2014 08:16    Anti-infectives: Anti-infectives   Start     Dose/Rate Route Frequency Ordered Stop   12/14/13 2359  ceFAZolin (ANCEF) IVPB 1 g/50 mL premix     1 g 100 mL/hr over 30 Minutes Intravenous Every 6 hours 12/14/13 2004 12/15/13 1759   12/14/13 1004  ceFAZolin (ANCEF) IVPB 2 g/50 mL premix     2 g 100 mL/hr over 30 Minutes Intravenous On call to O.R. 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 D/c pca.     LOS: 6 days    [name] 12/20/2013

## 2013-12-21 LAB — GLUCOSE, CAPILLARY
GLUCOSE-CAPILLARY: 134 mg/dL — AB (ref 70–99)
Glucose-Capillary: 167 mg/dL — ABNORMAL HIGH (ref 70–99)
Glucose-Capillary: 205 mg/dL — ABNORMAL HIGH (ref 70–99)

## 2013-12-21 LAB — CBC
HEMATOCRIT: 28.3 % — AB (ref 39.0–52.0)
Hemoglobin: 8.5 g/dL — ABNORMAL LOW (ref 13.0–17.0)
MCH: 23.5 pg — AB (ref 26.0–34.0)
MCHC: 30 g/dL (ref 30.0–36.0)
MCV: 78.4 fL (ref 78.0–100.0)
Platelets: 423 10*3/uL — ABNORMAL HIGH (ref 150–400)
RBC: 3.61 MIL/uL — ABNORMAL LOW (ref 4.22–5.81)
RDW: 20.1 % — ABNORMAL HIGH (ref 11.5–15.5)
WBC: 8.1 10*3/uL (ref 4.0–10.5)

## 2013-12-21 LAB — BASIC METABOLIC PANEL
BUN: 13 mg/dL (ref 6–23)
CO2: 30 mEq/L (ref 19–32)
CREATININE: 0.91 mg/dL (ref 0.50–1.35)
Calcium: 8.6 mg/dL (ref 8.4–10.5)
Chloride: 95 mEq/L — ABNORMAL LOW (ref 96–112)
GFR calc Af Amer: >90 mL/min (ref >90)
GFR, EST NON AFRICAN AMERICAN: 86 mL/min — AB (ref >90)
GLUCOSE: 159 mg/dL — AB (ref 70–99)
POTASSIUM: 4.1 meq/L (ref 3.7–5.3)
Sodium: 135 mEq/L — ABNORMAL LOW (ref 137–147)

## 2013-12-21 MED ORDER — OXYCODONE-ACETAMINOPHEN 5-325 MG PO TABS
1.0000 | ORAL_TABLET | ORAL | Status: DC | PRN
  Administered 2013-12-21 – 2013-12-22 (×4): 2 via ORAL
  Filled 2013-12-21 (×4): qty 2

## 2013-12-21 MED ORDER — LEVOTHYROXINE SODIUM 50 MCG PO TABS
50.0000 ug | ORAL_TABLET | Freq: Every day | ORAL | Status: DC
  Administered 2013-12-22: 50 ug via ORAL
  Filled 2013-12-21 (×2): qty 1

## 2013-12-21 MED ORDER — OXYCODONE-ACETAMINOPHEN 5-325 MG PO TABS: 1.0000 | ORAL_TABLET | ORAL | Status: DC | PRN

## 2013-12-21 NOTE — Progress Notes (Signed)
Patient ID: Dean Owens, male   DOB: October 06, 1945, 68 y.o.   MRN: [id number] 7 Days Post-Op   Subjective: No n/v since NGT removed.  Tolerating clears.    Objective: Vital signs in last 24 hours: Temp:  [98.1 F (36.7 C)-98.4 F (36.9 C)] 98.4 F (36.9 C) (04/28 0600) Pulse Rate:  [74-79] 74 (04/28 0600) Resp:  [18] 18 (04/28 0600) BP: (136-156)/(64-85) 142/76 mmHg (04/28 0600) SpO2:  [95 %-98 %] 98 % (04/28 0600) Last BM Date: 12/21/13  Intake/Output from previous day: 01-18-2023 0701 - 04/28 0700 In: 3360 [P.O.:360; I.V.:3000] Out: 400 [Urine:200; Emesis/NG output:200] Intake/Output this shift:    General appearance: alert, cooperative and no distress Resp: breathing comfortably GI: soft, approp tender, non distended.    Lab Results:   Recent Labs  Jan 17, 2014 0613 12/21/13 0405  WBC 7.7 8.1  HGB 8.7* 8.5*  HCT 27.8* 28.3*  PLT 379 423*   BMET  Recent Labs  17-Jan-2014 0613 12/21/13 0405  NA 141 135*  K 4.1 4.1  CL 97 95*  CO2 34* 30  GLUCOSE 167* 159*  BUN 23 13  CREATININE 1.11 0.91  CALCIUM 8.9 8.6   PT/INR No results found for this basename: LABPROT, INR,  in the last 72 hours ABG No results found for this basename: PHART, PCO2, PO2, HCO3,  in the last 72 hours  Studies/Results: Dg Abd 2 Views  01/17/2014   CLINICAL DATA:  Recent abdominal surgery. History of small-bowel obstruction. Abdominal pain and tenderness.  EXAM: ABDOMEN - 2 VIEW  COMPARISON:  Abdominal radiograph 12/17/2013.  FINDINGS: The tip of the nasogastric tube is either in the proximal duodenum or in the pre-pyloric region of the antrum of the stomach. There is some distal colonic and rectal stool. Paucity of colonic gas. Several prominent loops of gas-filled small bowel are noted in the right side of abdomen, measuring up to 4.1 cm in diameter. A small volume of pneumoperitoneum underneath the right hemidiaphragm, decreased from the examination from 3 days ago. Multiple surgical clips  throughout the abdomen and skin staples are noted in the right lower quadrant. Brachytherapy implants within the prostate gland.  IMPRESSION: 1. Persistent but decreasing volume of pneumoperitoneum, favored to be postoperative. 2. Nonspecific bowel gas pattern, as above, favor to represent a persistent postoperative ileus. 3. Support apparatus and postoperative changes, as above.   Electronically Signed   By: Vinnie Langton M.D.   On: 17-Jan-2014 08:16    Anti-infectives: Anti-infectives   Start     Dose/Rate Route Frequency Ordered Stop   12/14/13 2359  ceFAZolin (ANCEF) IVPB 1 g/50 mL premix     1 g 100 mL/hr over 30 Minutes Intravenous Every 6 hours 12/14/13 2004 12/15/13 1759   12/14/13 1004  ceFAZolin (ANCEF) IVPB 2 g/50 mL premix     2 g 100 mL/hr over 30 Minutes Intravenous On call to O.R. 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     LOS: 7 days    [name] 12/21/2013

## 2013-12-21 NOTE — Discharge Instructions (Signed)
[facility]      [facility] °[phone] ° °ABDOMINAL SURGERY: POST OP INSTRUCTIONS ° °Always review your discharge instruction sheet given to you by the facility where your surgery was performed. ° °IF YOU HAVE DISABILITY OR FAMILY LEAVE FORMS, YOU MUST BRING THEM TO THE OFFICE FOR PROCESSING.  PLEASE DO NOT GIVE THEM TO YOUR DOCTOR. ° °1. A prescription for pain medication may be given to you upon discharge.  Take your pain medication as prescribed, if needed.  If narcotic pain medicine is not needed, then you may take acetaminophen (Tylenol) or ibuprofen (Advil) as needed. °2. Take your usually prescribed medications unless otherwise directed. °3. If you need a refill on your pain medication, please contact your pharmacy. They will contact our office to request authorization.  Prescriptions will not be filled after 5pm or on week-ends. °4. You should follow a light diet the first few days after arrival home, such as soup and crackers, pudding, etc.unless your doctor has advised otherwise. A high-fiber, low fat diet can be resumed as tolerated.   Be sure to include lots of fluids daily. Most patients will experience some swelling and bruising on the chest and neck area.  Ice packs will help.  Swelling and bruising can take several days to resolve °5. Most patients will experience some swelling and bruising in the area of the incision. Ice pack will help. Swelling and bruising can take several days to resolve..  °6. It is common to experience some constipation if taking pain medication after surgery.  Increasing fluid intake and taking a stool softener will usually help or prevent this problem from occurring.  A mild laxative (Milk of Magnesia or Miralax) should be taken according to package directions if there are no bowel movements after 48 hours. °7.  You may have steri-strips (small skin tapes) in place directly over the incision.  These strips should be left on the skin for 10-14 days.  If your  surgeon used skin glue on the incision, you may shower in 48 hours.  The glue will flake off over the next 2-3 weeks.  Any sutures or staples will be removed at the office during your follow-up visit. You may find that a light gauze bandage over your incision may keep your staples from being rubbed or pulled. You may shower and replace the bandage daily. °8. ACTIVITIES:  You may resume regular (light) daily activities beginning the next day--such as daily self-care, walking, climbing stairs--gradually increasing activities as tolerated.  You may have sexual intercourse when it is comfortable.  Refrain from any heavy lifting or straining until approved by your doctor. °a. You may drive when you no longer are taking prescription pain medication, you can comfortably wear a seatbelt, and you can safely maneuver your car and apply brakes °b. Return to Work: __________8 weeks if applicable_________________________ °9. You should see your doctor in the office for a follow-up appointment approximately two weeks after your surgery.  Make sure that you call for this appointment within a day or two after you arrive home to insure a convenient appointment time. °OTHER INSTRUCTIONS:  °_____________________________________________________________ °_____________________________________________________________ ° °WHEN TO CALL YOUR DOCTOR: °1. Fever over 101.0 °2. Inability to urinate °3. Nausea and/or vomiting °4. Extreme swelling or bruising °5. Continued bleeding from incision. °6. Increased pain, redness, or drainage from the incision. °7. Difficulty swallowing or breathing °8. Muscle cramping or spasms. °9. Numbness or tingling in hands or feet or around lips. ° °The clinic staff is   available to answer your questions during regular business hours.  Please don’t hesitate to call and ask to speak to one of the nurses if you have concerns. ° °For further questions, please visit [email or web address] ° ° ° °

## 2013-12-21 NOTE — Progress Notes (Signed)
This patient is receiving Levothyroxine . Based on criteria approved by the Pharmacy and Therapeutics Committee, this medication is being converted to the equivalent oral dose form. These criteria include:   . The patient is eating (either orally or per tube) and/or has been taking other orally administered medications for at least 24 hours.  . This patient has no evidence of active gastrointestinal bleeding or impaired GI absorption (gastrectomy, short bowel, patient on TNA or NPO).   If you have questions about this conversion, please contact the pharmacy department.  Dara Hoyer, [facility] 12/21/2013 1:18 PM

## 2013-12-21 NOTE — Progress Notes (Signed)
Pt OOB/ambulating in halls today without difficulty. Occasional c/o abd pain and PRN pain meds admin as ordered. Tolerating Regular diet as ordered. Bowel movements x3 this am per pt.

## 2013-12-21 NOTE — Discharge Summary (Signed)
Physician Discharge Summary  Patient ID: Dean Owens MRN: [id number] DOB/AGE: Aug 10, 1946 68 y.o.  Admit date: 12/14/2013 Discharge date: 12/21/2013  Admission Diagnoses: Patient Active Problem List   Diagnosis Date Noted  . Retroperitoneal sarcoma 11/29/2013    Priority: High  . Sarcoma of retroperitoneum 12/14/2013  . Lynch syndrome   . 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 Accelerated hypertension 10/21/2013  . Type II diabetes mellitus 10/21/2013  . Unspecified hypothyroidism 10/21/2013  . Protein-calorie malnutrition, severe 10/14/2013  . Small bowel tumor 10/14/2013  . Colon cancer 10/12/2013  . Prostate cancer 10/12/2013    Discharge Diagnoses:  Active Problems:   Sarcoma of retroperitoneum see above  Discharged Condition: stable  Hospital Course:  Pt was admitted to the floor following a open excision of her right sided retroperitoneal sarcoma. This was a very extensive procedure given his prior surgical history. He did well for the first couple days with minimal pain. His On-Q pump was working quite well. After 3 days postoperatively, he developed an ileus and required NG tube placement. He had around 5 L out of his NG tube over several days. He also had a bump in his creatinine and this occurred and required IV fluids.  He eventually started to pass gas and have bowel movements and was able to have his NG tube removed and diet advanced. He was able to ambulate and void independently. He was able to transition to oral narcotics for his pain control. His staples are removed prior to discharge.  Consults: None  Significant Diagnostic Studies: labs: Cr 0.91, Wbcs 8.1, HCT 28.3  Treatments: IV hydration and surgery: see above  Discharge Exam: Blood pressure 149/67, pulse 69, temperature 98.1 F (36.7 C), temperature source Oral, resp. rate 18, height 5' 11.75" (1.822 m), weight 211 lb 13.8 oz (96.1 kg), SpO2 97.00%. General appearance: alert, cooperative and no  distress Resp: breathing comfortably Cardio: regular rate and rhythm GI: soft, approp tender, staples intact, non distended. Extremities: extremities normal, atraumatic, no cyanosis or edema  Disposition: 06-Home-Health Care Svc  Discharge Orders   Future Appointments Provider Department Dept Phone   01/03/2014 12:00 PM [name], [facility] [place] [phone]   01/13/2014 12:30 PM Ladell Pier, MD [facility] [phone]   Future Orders Complete By Expires   Call MD for:  difficulty breathing, headache or visual disturbances  As directed    Call MD for:  persistant nausea and vomiting  As directed    Call MD for:  redness, tenderness, or signs of infection (pain, swelling, redness, odor or green/yellow discharge around incision site)  As directed    Call MD for:  severe uncontrolled pain  As directed    Call MD for:  temperature >100.4  As directed    Diet - low sodium heart healthy  As directed    Increase activity slowly  As directed        Medication List         acetaminophen 325 MG tablet  Commonly known as:  TYLENOL  Take 2 tablets (650 mg total) by mouth every 6 (six) hours as needed for mild pain, moderate pain, fever or headache.     amLODipine-benazepril 5-10 MG per capsule  Commonly known as:  LOTREL  Take 1 capsule by mouth every morning.     diphenoxylate-atropine 2.5-0.025 MG per tablet  Commonly known as:  LOMOTIL  Take 1 tablet by mouth 4 (four) times daily as needed for  diarrhea or loose stools.     ferrous sulfate 325 (65 FE) MG tablet  Take 325 mg by mouth 2 (two) times daily with a meal.     glipiZIDE 5 MG tablet  Commonly known as:  GLUCOTROL  Take 5 mg by mouth daily before breakfast.     levothyroxine 50 MCG tablet  Commonly known as:  SYNTHROID, LEVOTHROID  Take 50 mcg by mouth daily before breakfast.     multivitamin with minerals Tabs tablet  Take 1 tablet by mouth daily.      oxyCODONE-acetaminophen 5-325 MG per tablet  Commonly known as:  PERCOCET/ROXICET  Take 1-2 tablets by mouth every 4 (four) hours as needed for moderate pain.           Follow-up Information   Follow up with [facility], [place] On 01/03/2014. (be there 11:45)    Specialty:  General Surgery   Contact information:   [place] [address] [phone]       Signed: [name] 12/21/2013, 10:43 PM

## 2013-12-22 LAB — BASIC METABOLIC PANEL
BUN: 11 mg/dL (ref 6–23)
CALCIUM: 8.8 mg/dL (ref 8.4–10.5)
CO2: 26 meq/L (ref 19–32)
Chloride: 100 mEq/L (ref 96–112)
Creatinine, Ser: 0.83 mg/dL (ref 0.50–1.35)
GFR calc Af Amer: >90 mL/min (ref >90)
GFR calc non Af Amer: 89 mL/min — ABNORMAL LOW (ref >90)
Glucose, Bld: 158 mg/dL — ABNORMAL HIGH (ref 70–99)
Potassium: 4.2 mEq/L (ref 3.7–5.3)
SODIUM: 136 meq/L — AB (ref 137–147)

## 2013-12-22 LAB — CBC
HCT: 28.8 % — ABNORMAL LOW (ref 39.0–52.0)
Hemoglobin: 9.1 g/dL — ABNORMAL LOW (ref 13.0–17.0)
MCH: 24.1 pg — AB (ref 26.0–34.0)
MCHC: 31.6 g/dL (ref 30.0–36.0)
MCV: 76.2 fL — ABNORMAL LOW (ref 78.0–100.0)
PLATELETS: 463 10*3/uL — AB (ref 150–400)
RBC: 3.78 MIL/uL — AB (ref 4.22–5.81)
RDW: 19.8 % — ABNORMAL HIGH (ref 11.5–15.5)
WBC: 8.9 10*3/uL (ref 4.0–10.5)

## 2013-12-29 ENCOUNTER — Other Ambulatory Visit (INDEPENDENT_AMBULATORY_CARE_PROVIDER_SITE_OTHER): Payer: Self-pay | Admitting: General Surgery

## 2013-12-29 ENCOUNTER — Telehealth (INDEPENDENT_AMBULATORY_CARE_PROVIDER_SITE_OTHER): Payer: Self-pay | Admitting: General Surgery

## 2013-12-29 ENCOUNTER — Telehealth (INDEPENDENT_AMBULATORY_CARE_PROVIDER_SITE_OTHER): Payer: Self-pay

## 2013-12-29 DIAGNOSIS — G8918 Other acute postprocedural pain: Secondary | ICD-10-CM

## 2013-12-29 DIAGNOSIS — C480 Malignant neoplasm of retroperitoneum: Secondary | ICD-10-CM

## 2013-12-29 MED ORDER — OXYCODONE-ACETAMINOPHEN 5-325 MG PO TABS: 1.0000 | ORAL_TABLET | ORAL | Status: DC | PRN

## 2013-12-29 NOTE — Telephone Encounter (Signed)
Script up front for pick up

## 2013-12-29 NOTE — Telephone Encounter (Signed)
Santiago Glad from [facility] called to let us know that after Dr. Lisbeth Renshaw spoke with Dr. Barry Dienes, he did agree to see the patient on 01/05/14.  I placed the referral in EPIC.

## 2013-12-29 NOTE — Telephone Encounter (Signed)
Pt requesting refill of percocet. Will ask our urge doctor this afternoon since Dr Barry Dienes is unavailable.

## 2013-12-29 NOTE — Addendum Note (Signed)
Addended by: Carlene Coria on: 12/29/2013 03:24 PM   Modules accepted: Orders

## 2013-12-31 ENCOUNTER — Encounter: Payer: Self-pay | Admitting: Radiation Oncology

## 2013-12-31 NOTE — Progress Notes (Signed)
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  Diagnosis 10/14/13:Small intestine, resection for tumor- INVASIVE WELL DIFFERENTIATED ADENOCARCINOMA, SPANNING 7.2 CM IN GREATEST DIMENSION.- TUMOR INVADES THROUGH MUSCULARIS PROPRIA INTO SUBSEROSAL TISSUES. THREE OF SIX LYMPH NODES ARE POSITIVE FOR METASTATIC ADENOCARCINOMA (3/6). - MARGINS ARE NEGATIVE.  Past/Anticipated interventions by surgeon, if any:  12/14/13 Preoperative diagnosis: Large right retroperitoneal sarcoma  Postoperative diagnosis: Same  Procedure: Cystoscopy with right retrograde pyelography and placement of ureteral stent  Surgeon: [name] M.D.   Post op recheck  up Dr.Byerly,F, 01/03/14      Past/Anticipated interventions by medical oncology, if any: appt Dr. Benay Spice 01/13/14   Weight changes, if any: loss 40 lbs   Bowel/Bladder complaints, if any: constipation, s/p pain meds, , takes colace prn  Nausea / Vomiting, if any: none  Pain issues, if any:  esophagus after eating food hurts bottom  SAFETY ISSUES:  Prior radiation?  Yes, prostate seed Implantation   Pacemaker/ICD? No  Is the patient on methotrexate? No  Current Complaints / other details: Hx Colon Cancer MSH2 mutation , Prostate Cancer DX 2007, , Skin= Squamous Cell Cancer, 4 surgeries for Lynch syndrome, cryoablatin prostate feb 2015 with Dr. Gaynelle Arabian,

## 2014-01-03 ENCOUNTER — Encounter (INDEPENDENT_AMBULATORY_CARE_PROVIDER_SITE_OTHER): Payer: Self-pay | Admitting: General Surgery

## 2014-01-03 ENCOUNTER — Ambulatory Visit (INDEPENDENT_AMBULATORY_CARE_PROVIDER_SITE_OTHER): Payer: Medicare Other | Admitting: General Surgery

## 2014-01-03 VITALS — BP 132/70 | HR 74 | Temp 97.2°F | Ht 71.0 in | Wt 196.0 lb

## 2014-01-03 DIAGNOSIS — C480 Malignant neoplasm of retroperitoneum: Secondary | ICD-10-CM

## 2014-01-03 NOTE — Assessment & Plan Note (Signed)
He will likely need radiation for positive margin.  He has appointment later this week with Dr. Lisbeth Renshaw.   He is still tired, with a decreased appetite, which is normal for this point.    I will see him back in 2-3 months.

## 2014-01-03 NOTE — Progress Notes (Signed)
HISTORY: Patient is a 68 year old male who is approximately 2-3 weeks status post excision of a large right-sided retroperitoneal liposarcoma. He is doing relatively well. He is still taking around 3-5 pain pills per day. He has decreased on the significantly. He still is slightly fatigued and has decreased appetite. He denies fevers, chills, night sweats, nausea, and vomiting. His bowel movements are back to baseline.    EXAM: General:  Alert and oriented.   Incision:  Healing well.  No evidence of cellulitis.     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   ASSESSMENT AND PLAN:   Retroperitoneal liposarcoma He will likely need radiation for positive margin.  He has appointment later this week with Dr. Lisbeth Renshaw.   He is still tired, with a decreased appetite, which is normal for this point.    I will see him back in 2-3 months.        Milus Height, MD Surgical Oncology, [facility]  Horton Finer, MD No ref. provider found

## 2014-01-03 NOTE — Patient Instructions (Signed)
Continue lifting restrictions.    Follow up in 2-3 months.

## 2014-01-05 ENCOUNTER — Encounter: Payer: Self-pay | Admitting: Radiation Oncology

## 2014-01-05 ENCOUNTER — Ambulatory Visit
Admission: RE | Admit: 2014-01-05 | Discharge: 2014-01-05 | Disposition: A | Discharge status: Home / Self Care | Payer: Medicare Other | Source: Ambulatory Visit | Attending: Radiation Oncology | Admitting: Radiation Oncology

## 2014-01-05 VITALS — BP 150/76 | HR 80 | Temp 97.8°F | Resp 20 | Ht 71.75 in | Wt 196.3 lb

## 2014-01-05 DIAGNOSIS — C179 Malignant neoplasm of small intestine, unspecified: Secondary | ICD-10-CM | POA: Insufficient documentation

## 2014-01-05 DIAGNOSIS — Z510 Encounter for antineoplastic radiation therapy: Secondary | ICD-10-CM | POA: Diagnosis not present

## 2014-01-05 DIAGNOSIS — C480 Malignant neoplasm of retroperitoneum: Secondary | ICD-10-CM | POA: Diagnosis not present

## 2014-01-05 HISTORY — DX: Ileus, unspecified: K56.7

## 2014-01-05 HISTORY — DX: Malignant neoplasm of connective and soft tissue, unspecified: C49.9

## 2014-01-05 NOTE — Progress Notes (Signed)
Radiation Oncology         ([phone]) [phone] ________________________________  Name: Dean Owens MRN: [id number]  Date: 01/05/2014  DOB: 05/24/1946  QB:HALPFXT,KWIOX CHARLES, MD  Dean Klein, MD     REFERRING PHYSICIAN: [name], MD   DIAGNOSIS: The primary encounter diagnosis was Sarcoma of retroperitoneum. A diagnosis of Retroperitoneal liposarcoma was also pertinent to this visit.   HISTORY OF PRESENT ILLNESS::Dean Owens is a 67 y.o. male who is seen for an initial consultation visit. The patient has had a significant history of malignancy. He has been diagnosed with Lynch syndrome and the patient has undergone several: Resections related to this. The patient recently suffered from a bowel obstruction and an exploratory laparotomy was performed. This demonstrated a small bowel adenocarcinoma which was resected. The patient also was found to have a right-sided tumor which was quite large which was not initially removed. The pathology from the small intestine tumor returned positive for invasive well-differentiated adenocarcinoma measuring 7.2 cm. This represented a T3 N1 tumor with 3/6 lymph nodes positive for carcinoma. The margins were negative.  The patient more recently underwent a biopsy of the retroperitoneal mass which returned positive for spindle cell neoplasm consistent with sarcoma. Resection was performed on 12/15/2013. This returned positive for a high-grade liposarcoma measuring 16 cm. The resection margins were positive corresponding to the anterior, posterior, and lateral margins. This represented a T2 B. tumor. No lymph nodes were evaluated. No. Neural involvement and no lymphovascular invasion was seen.  The patient's case has been discussed in multidisciplinary GI conference. Given the high risk features, I have been asked to see the patient today for consideration of possible postoperative radiation treatment.   PREVIOUS RADIATION THERAPY: No   PAST  MEDICAL HISTORY:  has a past medical history of Hypertension; Type 2 diabetes mellitus; Hypothyroidism; History of colon cancer (NO RECURRENCE); ED (erectile dysfunction) of organic origin; Right knee DJD; Eczema; OA (osteoarthritis); Wears glasses; History of iron deficiency anemia; History of squamous cell carcinoma excision; Accelerated hypertension (10/21/2013); Type II diabetes mellitus (10/21/2013); Unspecified hypothyroidism (10/21/2013); Colon cancer (1997, 1999, 2015); Lynch syndrome; History of nonmelanoma skin cancer; Anemia; Recurrent prostate carcinoma (FOLLOWED BY Dean Owens); Sarcoma (12/15/13); Ileus; and History of blood transfusion (09/2013).     PAST SURGICAL HISTORY: Past Surgical History  Procedure Laterality Date  . Givens capsule study N/A 07/05/2013    Procedure: GIVENS CAPSULE STUDY;  Surgeon: Dean Fair, MD;  Location: [facility];  Service: Endoscopy;  Laterality: N/A;  . Radioactive prostate seed implants  OCT 2007  . Knee arthroscopy Bilateral 2006 &  2007  . Tonsillectomy  AS CHILD  . Appendectomy  AGE 49  . Cryoablation N/A 10/01/2013    Procedure: CRYO ABLATION PROSTATE;  Surgeon: Dean Rud, MD;  Location: [facility];  Service: Urology;  Laterality: N/A;  . Prostate surgery    . Right colectomy  09/07/1995  . Sigmoidectomy  11/13/95  . Total colectomy  12/29/1997    ileorectal anastomosis  . Laparotomy N/A 10/14/2013    Procedure: EXPLORATORY LAPAROTOMY;  Surgeon: Dean Curry, MD;  Location: [facility];  Service: General;  Laterality: N/A;  . Bowel resection N/A 10/14/2013    Procedure: SMALL BOWEL RESECTION;  Surgeon: Dean Curry, MD;  Location: [facility];  Service: General;  Laterality: N/A;  . Lysis of adhesion N/A 10/14/2013    Procedure: LYSIS OF ADHESION;  Surgeon: Dean Curry, MD;  Location: [facility];  Service: General;  Laterality: N/A;  . Laparotomy N/A 12/14/2013    Procedure: EXPLORATORY LAPAROTOMY AND RESECTION OF RETROPERITONEAL  SARCOMA;  Surgeon: Dean Klein, MD;  Location: [facility];  Service: General;  Laterality: N/A;  . Cystoscopy with retrograde pyelogram, ureteroscopy and stent placement Right 12/14/2013    Procedure: CYSTOSCOPY WITH RIGHT RETROGRADE PYELOGRAM / [place];  Surgeon: Dean Amass, MD;  Location: [facility];  Service: Urology;  Laterality: Right;     FAMILY HISTORY: family history includes Brain cancer (age of onset: 36) in his cousin; Breast cancer (age of onset: 14) in his cousin; Cancer (age of onset: 32) in his paternal aunt; Colon cancer in his paternal grandmother; Colon cancer (age of onset: 59) in his brother; Colon cancer (age of onset: 31) in his cousin and cousin; Colon cancer (age of onset: 54) in his paternal aunt; Colon polyps in his cousin and cousin; Heart failure in his father and mother; Lung cancer (age of onset: 35) in his cousin; Ovarian cancer in his maternal aunt; Uterine cancer (age of onset: 67) in his cousin; Uterine cancer (age of onset: 72) in his cousin; Uterine cancer (age of onset: 81) in his paternal aunt.   SOCIAL HISTORY:  reports that he quit smoking about 3 months ago. His smoking use included Cigarettes. He has a 7.5 pack-year smoking history. He has never used smokeless tobacco. He reports that he drinks alcohol. He reports that he does not use illicit drugs.   ALLERGIES: Review of patient's allergies indicates no known allergies.   MEDICATIONS:  Current Outpatient Prescriptions  Medication Sig Dispense Refill  . acetaminophen (TYLENOL) 325 MG tablet Take 2 tablets (650 mg total) by mouth every 6 (six) hours as needed for mild pain, moderate pain, fever or headache.      [name] amLODipine-benazepril (LOTREL) 5-10 MG per capsule Take 1 capsule by mouth every morning.      . diphenoxylate-atropine (LOMOTIL) 2.5-0.025 MG per tablet Take 1 tablet by mouth 4 (four) times daily as needed for diarrhea or loose stools.      . ferrous sulfate 325 (65 FE) MG  tablet Take 325 mg by mouth 2 (two) times daily with a meal.      . glipiZIDE (GLUCOTROL) 5 MG tablet Take 5 mg by mouth daily before breakfast.      . levothyroxine (SYNTHROID, LEVOTHROID) 50 MCG tablet Take 50 mcg by mouth daily before breakfast.      . Multiple Vitamin (MULTIVITAMIN WITH MINERALS) TABS tablet Take 1 tablet by mouth daily.      [name] oxyCODONE-acetaminophen (PERCOCET/ROXICET) 5-325 MG per tablet Take 1-2 tablets by mouth every 4 (four) hours as needed for moderate pain.  50 tablet  0   No current facility-administered medications for this encounter.     REVIEW OF SYSTEMS:  A 15 point review of systems is documented in the electronic medical record. This was obtained by the nursing staff. However, I reviewed this with the patient to discuss relevant findings and make appropriate changes.  Pertinent items are noted in HPI.    PHYSICAL EXAM:  height is 5' 11.75" (1.822 m) and weight is 196 lb 4.8 oz (89.041 kg). His oral temperature is 97.8 F (36.6 C). His blood pressure is 150/76 and his pulse is 80. His respiration is 20 and oxygen saturation is 100%.   ECOG = 1  0 - Asymptomatic (Fully active, able to carry on all predisease activities without restriction)  1 - Symptomatic but completely ambulatory (  Restricted in physically strenuous activity but ambulatory and able to carry out work of a light or sedentary nature. For example, light housework, office work)  2 - Symptomatic, <50% in bed during the day (Ambulatory and capable of all self care but unable to carry out any work activities. Up and about more than 50% of waking hours)  3 - Symptomatic, >50% in bed, but not bedbound (Capable of only limited self-care, confined to bed or chair 50% or more of waking hours)  4 - Bedbound (Completely disabled. Cannot carry on any self-care. Totally confined to bed or chair)  5 - Death   Eustace Pen MM, Creech RH, Tormey DC, et al. [phone]). "Toxicity and response criteria of the [facility] Group". [place] Oncol. 5 (6): 649-55  General: Well-developed, in no acute distress HEENT: Normocephalic, atraumatic; oral cavity clear Neck: Supple without any lymphadenopathy Cardiovascular: Regular rate and rhythm Respiratory: Clear to auscultation bilaterally GI: Soft, nontender, normal bowel sounds; well-healed surgical incisions present in the anterior abdomen. No sign of infection. Extremities: No edema present Neuro: No focal deficits     LABORATORY DATA:  Lab Results  Component Value Date   WBC 8.9 12/22/2013   HGB 9.1* 12/22/2013   HCT 28.8* 12/22/2013   MCV 76.2* 12/22/2013   PLT 463* 12/22/2013   Lab Results  Component Value Date   NA 136* 12/22/2013   K 4.2 12/22/2013   CL 100 12/22/2013   CO2 26 12/22/2013   Lab Results  Component Value Date   ALT 23 12/07/2013   AST 29 12/07/2013   ALKPHOS 134* 12/07/2013   BILITOT 0.2* 12/07/2013      RADIOGRAPHY: Dg Abd 2 Views  12/20/2013   CLINICAL DATA:  Recent abdominal surgery. History of small-bowel obstruction. Abdominal pain and tenderness.  EXAM: ABDOMEN - 2 VIEW  COMPARISON:  Abdominal radiograph 12/17/2013.  FINDINGS: The tip of the nasogastric tube is either in the proximal duodenum or in the pre-pyloric region of the antrum of the stomach. There is some distal colonic and rectal stool. Paucity of colonic gas. Several prominent loops of gas-filled small bowel are noted in the right side of abdomen, measuring up to 4.1 cm in diameter. A small volume of pneumoperitoneum underneath the right hemidiaphragm, decreased from the examination from 3 days ago. Multiple surgical clips throughout the abdomen and skin staples are noted in the right lower quadrant. Brachytherapy implants within the prostate gland.  IMPRESSION: 1. Persistent but decreasing volume of pneumoperitoneum, favored to be postoperative. 2. Nonspecific bowel gas pattern, as above, favor to represent a persistent postoperative ileus. 3.  Support apparatus and postoperative changes, as above.   Electronically Signed   By: Vinnie Langton M.D.   On: 12/20/2013 08:16   Dg Abd 2 Views  12/17/2013   CLINICAL DATA:  Vomiting and abdominal distension post exploratory laparotomy and resection of retroperitoneal sarcoma on 12/14/2013; past history of colon cancer, prostate cancer, Lynch syndrome, appendectomy, sigmoidectomy, colectomy, smaller resection, hypertension, diabetes  EXAM: ABDOMEN - 2 VIEW  COMPARISON:  CT abdomen and pelvis 10/12/2013, abdominal radiographs 10/11/2013  FINDINGS: Free intraperitoneal air.  Air-filled loops of large and small bowel question ileus.  Numerous surgical clips in the right abdomen new since previous exam.  No definite bowel wall thickening.  Bones unremarkable.  Brachytherapy seed implants in prostate bed.  No acute osseous findings.  IMPRESSION: Free intraperitoneal air, which could be related to abdominal surgery 3 days ago.  Air-filled loops of  large and small bowel throughout abdomen suggesting postoperative ileus.  No evidence of bowel wall thickening.   Electronically Signed   By: Lavonia Dana M.D.   On: 12/17/2013 23:44   Dg C-arm 1-60 Min-no Report  12/27/2013   CLINICAL DATA:  Fluoroscopy was utilized by the requesting physician. No radiographic interpretation.  EXAM: DG C-ARM 1-60 MIN - NRPT [facility]  COMPARISON:  None.   Electronically Signed   By: Porfirio Mylar   On: 12/27/2013 10:50       IMPRESSION: The patient is status post resection of a large retroperitoneal sarcoma which corresponded to a high grade liposarcoma. This measures 16 cm and positive margins were present. The patient also has a complex history including a recent resection also of any adenocarcinoma of the small bowel.  Given the high-risk features involving the sarcoma, I recommended for the patient to proceed with postoperative radiation treatment. I discussed the rationale and possible benefit of this treatment with the patient  including local/regional control. We also discussed the possible side effects and risks of treatment as well. All of his questions were answered. The patient does wish to proceed with radiation treatment.   PLAN: The patient will be scheduled for a simulation in the near future such that we can proceed with treatment planning. The patient is going to be out of town in a couple of weeks. We will schedule a simulation such that we are ready to begin radiation treatment on 01/19/2014.      ________________________________   Jodelle Gross, MD, PhD   **Disclaimer: This note was dictated with voice recognition software. Similar sounding words can inadvertently be transcribed and this note may contain transcription errors which may not have been corrected upon publication of note.**

## 2014-01-05 NOTE — Addendum Note (Signed)
Encounter addended by: Deirdre Evener, RN on: 01/05/2014  6:51 PM<BR>     Documentation filed: Charges VN

## 2014-01-05 NOTE — Progress Notes (Signed)
Please see the Nurse Progress Note in the MD Initial Consult Encounter for this patient. 

## 2014-01-10 ENCOUNTER — Ambulatory Visit
Admission: RE | Admit: 2014-01-10 | Discharge: 2014-01-10 | Disposition: A | Discharge status: Home / Self Care | Payer: Medicare Other | Source: Ambulatory Visit | Attending: Radiation Oncology | Admitting: Radiation Oncology

## 2014-01-10 DIAGNOSIS — Z510 Encounter for antineoplastic radiation therapy: Secondary | ICD-10-CM | POA: Diagnosis not present

## 2014-01-10 DIAGNOSIS — C480 Malignant neoplasm of retroperitoneum: Secondary | ICD-10-CM

## 2014-01-12 ENCOUNTER — Telehealth: Payer: Self-pay | Admitting: *Deleted

## 2014-01-12 NOTE — Telephone Encounter (Signed)
Received message from pt asking "Do I need to come to MD appt tomorrow?  I'm under Dr. Ida Rogue care right now with radiation"  Per Dr. Benay Spice; notified pt that OK not to come to appt 01/13/14 and scheduler will contact him with new appt date/time for 6 weeks out.  Pt verbalized understanding and expressed appreciation for call back.

## 2014-01-13 ENCOUNTER — Ambulatory Visit: Payer: Medicare Other | Admitting: Oncology

## 2014-01-14 ENCOUNTER — Telehealth: Payer: Self-pay | Admitting: Oncology

## 2014-01-14 DIAGNOSIS — Z510 Encounter for antineoplastic radiation therapy: Secondary | ICD-10-CM | POA: Diagnosis not present

## 2014-01-14 NOTE — Telephone Encounter (Signed)
, °

## 2014-01-19 ENCOUNTER — Ambulatory Visit
Admission: RE | Admit: 2014-01-19 | Discharge: 2014-01-19 | Disposition: A | Discharge status: Home / Self Care | Payer: Medicare Other | Source: Ambulatory Visit | Attending: Radiation Oncology | Admitting: Radiation Oncology

## 2014-01-19 ENCOUNTER — Ambulatory Visit: Payer: Medicare Other

## 2014-01-19 ENCOUNTER — Ambulatory Visit: Admission: RE | Admit: 2014-01-19 | Payer: Medicare Other | Source: Ambulatory Visit | Admitting: Radiation Oncology

## 2014-01-19 DIAGNOSIS — Z510 Encounter for antineoplastic radiation therapy: Secondary | ICD-10-CM | POA: Diagnosis not present

## 2014-01-20 ENCOUNTER — Ambulatory Visit: Payer: Medicare Other

## 2014-01-20 ENCOUNTER — Ambulatory Visit
Admission: RE | Admit: 2014-01-20 | Discharge: 2014-01-20 | Disposition: A | Discharge status: Home / Self Care | Payer: Medicare Other | Source: Ambulatory Visit | Attending: Radiation Oncology | Admitting: Radiation Oncology

## 2014-01-20 DIAGNOSIS — Z510 Encounter for antineoplastic radiation therapy: Secondary | ICD-10-CM | POA: Diagnosis not present

## 2014-01-21 ENCOUNTER — Ambulatory Visit: Payer: Medicare Other

## 2014-01-21 ENCOUNTER — Ambulatory Visit
Admission: RE | Admit: 2014-01-21 | Discharge: 2014-01-21 | Disposition: A | Discharge status: Home / Self Care | Payer: Medicare Other | Source: Ambulatory Visit | Attending: Radiation Oncology | Admitting: Radiation Oncology

## 2014-01-21 ENCOUNTER — Encounter: Payer: Self-pay | Admitting: Radiation Oncology

## 2014-01-21 VITALS — BP 145/82 | HR 83 | Temp 97.7°F | Wt 194.9 lb

## 2014-01-21 DIAGNOSIS — Z510 Encounter for antineoplastic radiation therapy: Secondary | ICD-10-CM | POA: Diagnosis not present

## 2014-01-21 DIAGNOSIS — C480 Malignant neoplasm of retroperitoneum: Secondary | ICD-10-CM

## 2014-01-21 MED ORDER — RADIAPLEXRX EX GEL
Freq: Once | CUTANEOUS | Status: AC
  Administered 2014-01-21: 08:00:00 via TOPICAL

## 2014-01-21 NOTE — Progress Notes (Signed)
   Department of Radiation Oncology  Phone:  [phone] Fax:        [phone]  Weekly Treatment Note    Name: Dean Owens Date: 01/21/2014 MRN: [id number] DOB: [birth year]   Current dose: 5 .4 Gy  Current fraction: 3   MEDICATIONS: Current Outpatient Prescriptions  Medication Sig Dispense Refill  . acetaminophen (TYLENOL) 325 MG tablet Take 2 tablets (650 mg total) by mouth every 6 (six) hours as needed for mild pain, moderate pain, fever or headache.      [name] amLODipine-benazepril (LOTREL) 5-10 MG per capsule Take 1 capsule by mouth every morning.      . diphenoxylate-atropine (LOMOTIL) 2.5-0.025 MG per tablet Take 1 tablet by mouth 4 (four) times daily as needed for diarrhea or loose stools.      . ferrous sulfate 325 (65 FE) MG tablet Take 325 mg by mouth 2 (two) times daily with a meal.      . glipiZIDE (GLUCOTROL) 5 MG tablet Take 5 mg by mouth daily before breakfast.      . hyaluronate sodium (RADIAPLEXRX) GEL Apply 1 application topically daily. Apply to abdomen daily after rad txs and on weekends      . levothyroxine (SYNTHROID, LEVOTHROID) 50 MCG tablet Take 50 mcg by mouth daily before breakfast.      . Multiple Vitamin (MULTIVITAMIN WITH MINERALS) TABS tablet Take 1 tablet by mouth daily.      [name] oxyCODONE-acetaminophen (PERCOCET/ROXICET) 5-325 MG per tablet Take 1-2 tablets by mouth every 4 (four) hours as needed for moderate pain.  50 tablet  0   No current facility-administered medications for this encounter.     ALLERGIES: Review of patient's allergies indicates no known allergies.   LABORATORY DATA:  Lab Results  Component Value Date   WBC 8.9 12/22/2013   HGB 9.1* 12/22/2013   HCT 28.8* 12/22/2013   MCV 76.2* 12/22/2013   PLT 463* 12/22/2013   Lab Results  Component Value Date   NA 136* 12/22/2013   K 4.2 12/22/2013   CL 100 12/22/2013   CO2 26 12/22/2013   Lab Results  Component Value Date   ALT 23 12/07/2013   AST 29 12/07/2013   ALKPHOS 134*  12/07/2013   BILITOT 0.2* 12/07/2013     NARRATIVE: Dean Owens was seen today for weekly treatment management. The chart was checked and the patient's films were reviewed. The patient complains of some loose stools. He does have Lomotil medication available. No nausea so far. Overall he feels he is doing well.  PHYSICAL EXAMINATION: weight is 194 lb 14.4 oz (88.406 kg). His temperature is 97.7 F (36.5 C). His blood pressure is 145/82 and his pulse is 83. His oxygen saturation is 99%.        ASSESSMENT: The patient is doing satisfactorily with treatment.  PLAN: We will continue with the patient's radiation treatment as planned.

## 2014-01-21 NOTE — Progress Notes (Signed)
Weekly treatment-3rd of 28.  Experiencing no pain, no nausea  but is having periods of diarrhea. Pt education done.  Rad book, radiapelx given, business card, discussed side effects and teach back given.

## 2014-01-21 NOTE — Progress Notes (Signed)
  Radiation Oncology         [phone]) [phone] ________________________________  Name: Dean Owens MRN: [id number]  Date: 01/10/2014  DOB: [birth year]  SIMULATION AND TREATMENT PLANNING NOTE  DIAGNOSIS:  Retroperitoneal sarcoma  Site:  Right abdomen  NARRATIVE:  The patient was brought to the [facility].  Identity was confirmed.  All relevant records and images related to the planned course of therapy were reviewed.   Written consent to proceed with treatment was confirmed which was freely given after reviewing the details related to the planned course of therapy had been reviewed with the patient.  Then, the patient was set-up in a stable reproducible  supine position for radiation therapy.  CT images were obtained.  Surface markings were placed.    Medically necessary complex treatment device(s) for immobilization:  Customized VAC lock bag.   The CT images were loaded into the planning software.  Then the target and avoidance structures were contoured.  Treatment planning then occurred.  The radiation prescription was entered and confirmed.   I have requested : Intensity Modulated Radiotherapy (IMRT) is medically necessary for this case for the following reason:  Sparing of normal tissues including the kidneys and spinal cord given the large clinical target volume with the patient having undergone resection of a very large sarcoma from the abdomen.   PLAN:  The patient will receive 50.4 Gy in 28 fractions.  ________________________________   Jodelle Gross, MD, PhD

## 2014-01-24 ENCOUNTER — Ambulatory Visit
Admission: RE | Admit: 2014-01-24 | Discharge: 2014-01-24 | Disposition: A | Discharge status: Home / Self Care | Payer: Medicare Other | Source: Ambulatory Visit | Attending: Radiation Oncology | Admitting: Radiation Oncology

## 2014-01-24 ENCOUNTER — Ambulatory Visit: Payer: Medicare Other

## 2014-01-24 DIAGNOSIS — Z510 Encounter for antineoplastic radiation therapy: Secondary | ICD-10-CM | POA: Diagnosis not present

## 2014-01-25 ENCOUNTER — Ambulatory Visit: Payer: Medicare Other

## 2014-01-25 ENCOUNTER — Ambulatory Visit
Admission: RE | Admit: 2014-01-25 | Discharge: 2014-01-25 | Disposition: A | Discharge status: Home / Self Care | Payer: Medicare Other | Source: Ambulatory Visit | Attending: Radiation Oncology | Admitting: Radiation Oncology

## 2014-01-25 DIAGNOSIS — Z510 Encounter for antineoplastic radiation therapy: Secondary | ICD-10-CM | POA: Diagnosis not present

## 2014-01-26 ENCOUNTER — Ambulatory Visit
Admission: RE | Admit: 2014-01-26 | Discharge: 2014-01-26 | Disposition: A | Discharge status: Home / Self Care | Payer: Medicare Other | Source: Ambulatory Visit | Attending: Radiation Oncology | Admitting: Radiation Oncology

## 2014-01-26 ENCOUNTER — Ambulatory Visit: Payer: Medicare Other

## 2014-01-26 DIAGNOSIS — Z510 Encounter for antineoplastic radiation therapy: Secondary | ICD-10-CM | POA: Diagnosis not present

## 2014-01-27 ENCOUNTER — Ambulatory Visit: Payer: Medicare Other

## 2014-01-27 DIAGNOSIS — Z510 Encounter for antineoplastic radiation therapy: Secondary | ICD-10-CM | POA: Diagnosis not present

## 2014-01-28 ENCOUNTER — Encounter: Payer: Self-pay | Admitting: Radiation Oncology

## 2014-01-28 ENCOUNTER — Ambulatory Visit: Payer: Medicare Other

## 2014-01-28 ENCOUNTER — Ambulatory Visit
Admission: RE | Admit: 2014-01-28 | Discharge: 2014-01-28 | Disposition: A | Discharge status: Home / Self Care | Payer: Medicare Other | Source: Ambulatory Visit | Attending: Radiation Oncology | Admitting: Radiation Oncology

## 2014-01-28 VITALS — BP 151/77 | HR 93 | Temp 97.9°F | Resp 20 | Wt 199.0 lb

## 2014-01-28 DIAGNOSIS — C480 Malignant neoplasm of retroperitoneum: Secondary | ICD-10-CM

## 2014-01-28 DIAGNOSIS — Z510 Encounter for antineoplastic radiation therapy: Secondary | ICD-10-CM | POA: Diagnosis not present

## 2014-01-28 NOTE — Progress Notes (Signed)
   Department of Radiation Oncology  Phone:  [phone] Fax:        [phone]  Weekly Treatment Note    Name: Dean Owens Date: 01/28/2014 MRN: [id number] DOB: 09-14-1945   Current dose: 14.4 Gy  Current fraction: 8   MEDICATIONS: Current Outpatient Prescriptions  Medication Sig Dispense Refill  . acetaminophen (TYLENOL) 325 MG tablet Take 2 tablets (650 mg total) by mouth every 6 (six) hours as needed for mild pain, moderate pain, fever or headache.      [name] amLODipine-benazepril (LOTREL) 5-10 MG per capsule Take 1 capsule by mouth every morning.      . diphenoxylate-atropine (LOMOTIL) 2.5-0.025 MG per tablet Take 1 tablet by mouth 4 (four) times daily as needed for diarrhea or loose stools.      . ferrous sulfate 325 (65 FE) MG tablet Take 325 mg by mouth 2 (two) times daily with a meal.      . glipiZIDE (GLUCOTROL) 5 MG tablet Take 5 mg by mouth daily before breakfast.      . hyaluronate sodium (RADIAPLEXRX) GEL Apply 1 application topically daily. Apply to abdomen daily after rad txs and on weekends      . levothyroxine (SYNTHROID, LEVOTHROID) 50 MCG tablet Take 50 mcg by mouth daily before breakfast.      . Multiple Vitamin (MULTIVITAMIN WITH MINERALS) TABS tablet Take 1 tablet by mouth daily.      [name] oxyCODONE-acetaminophen (PERCOCET/ROXICET) 5-325 MG per tablet Take 1-2 tablets by mouth every 4 (four) hours as needed for moderate pain.  50 tablet  0   No current facility-administered medications for this encounter.     ALLERGIES: Review of patient's allergies indicates no known allergies.   LABORATORY DATA:  Lab Results  Component Value Date   WBC 8.9 12/22/2013   HGB 9.1* 12/22/2013   HCT 28.8* 12/22/2013   MCV 76.2* 12/22/2013   PLT 463* 12/22/2013   Lab Results  Component Value Date   NA 136* 12/22/2013   K 4.2 12/22/2013   CL 100 12/22/2013   CO2 26 12/22/2013   Lab Results  Component Value Date   ALT 23 12/07/2013   AST 29 12/07/2013   ALKPHOS 134*  12/07/2013   BILITOT 0.2* 12/07/2013     NARRATIVE: Dean Owens was seen today for weekly treatment management. The chart was checked and the patient's films were reviewed. The patient is doing relatively well. No nausea. He does have some diarrhea and is taking Lomotil which is adequately controlling this at this time. The patient asked about whether he could undergo cortisone injections for his knees and we discussed that this would be fine during treatment.  PHYSICAL EXAMINATION: weight is 199 lb (90.266 kg). His oral temperature is 97.9 F (36.6 C). His blood pressure is 151/77 and his pulse is 93. His respiration is 20.        ASSESSMENT: The patient is doing satisfactorily with treatment.  PLAN: We will continue with the patient's radiation treatment as planned.

## 2014-01-28 NOTE — Progress Notes (Addendum)
Weekly rad txs abdomen 8/28 completed, appetite low to medium, no nausea, takes 2 lomotil tabs in am and bedtime, helping loose stools, and takes 1 more lomitil tab if he wakes up in middle of the night, pain in right knee, asking if he can get cortisone injection  During rad txs, slight fatigue 10:47 AM

## 2014-01-31 ENCOUNTER — Ambulatory Visit: Payer: Medicare Other

## 2014-01-31 ENCOUNTER — Ambulatory Visit
Admission: RE | Admit: 2014-01-31 | Discharge: 2014-01-31 | Disposition: A | Discharge status: Home / Self Care | Payer: Medicare Other | Source: Ambulatory Visit | Attending: Radiation Oncology | Admitting: Radiation Oncology

## 2014-01-31 DIAGNOSIS — Z510 Encounter for antineoplastic radiation therapy: Secondary | ICD-10-CM | POA: Diagnosis not present

## 2014-02-01 ENCOUNTER — Ambulatory Visit: Payer: Medicare Other

## 2014-02-01 ENCOUNTER — Ambulatory Visit
Admission: RE | Admit: 2014-02-01 | Discharge: 2014-02-01 | Disposition: A | Discharge status: Home / Self Care | Payer: Medicare Other | Source: Ambulatory Visit | Attending: Radiation Oncology | Admitting: Radiation Oncology

## 2014-02-01 DIAGNOSIS — Z510 Encounter for antineoplastic radiation therapy: Secondary | ICD-10-CM | POA: Diagnosis not present

## 2014-02-02 ENCOUNTER — Ambulatory Visit
Admission: RE | Admit: 2014-02-02 | Discharge: 2014-02-02 | Disposition: A | Discharge status: Home / Self Care | Payer: Medicare Other | Source: Ambulatory Visit | Attending: Radiation Oncology | Admitting: Radiation Oncology

## 2014-02-02 ENCOUNTER — Ambulatory Visit: Payer: Medicare Other

## 2014-02-02 DIAGNOSIS — Z510 Encounter for antineoplastic radiation therapy: Secondary | ICD-10-CM | POA: Diagnosis not present

## 2014-02-03 ENCOUNTER — Ambulatory Visit
Admission: RE | Admit: 2014-02-03 | Discharge: 2014-02-03 | Disposition: A | Discharge status: Home / Self Care | Payer: Medicare Other | Source: Ambulatory Visit | Attending: Radiation Oncology | Admitting: Radiation Oncology

## 2014-02-03 ENCOUNTER — Ambulatory Visit: Payer: Medicare Other

## 2014-02-03 DIAGNOSIS — Z510 Encounter for antineoplastic radiation therapy: Secondary | ICD-10-CM | POA: Diagnosis not present

## 2014-02-04 ENCOUNTER — Ambulatory Visit
Admission: RE | Admit: 2014-02-04 | Discharge: 2014-02-04 | Disposition: A | Discharge status: Home / Self Care | Payer: Medicare Other | Source: Ambulatory Visit | Attending: Radiation Oncology | Admitting: Radiation Oncology

## 2014-02-04 ENCOUNTER — Encounter: Payer: Self-pay | Admitting: Radiation Oncology

## 2014-02-04 ENCOUNTER — Ambulatory Visit: Payer: Medicare Other

## 2014-02-04 VITALS — BP 162/80 | HR 94 | Temp 97.7°F | Resp 20 | Wt 197.8 lb

## 2014-02-04 DIAGNOSIS — Z510 Encounter for antineoplastic radiation therapy: Secondary | ICD-10-CM | POA: Diagnosis not present

## 2014-02-04 DIAGNOSIS — C480 Malignant neoplasm of retroperitoneum: Secondary | ICD-10-CM

## 2014-02-04 MED ORDER — ONDANSETRON HCL 8 MG PO TABS: 8.0000 mg | ORAL_TABLET | Freq: Two times a day (BID) | ORAL | Status: DC | PRN

## 2014-02-04 NOTE — Progress Notes (Addendum)
Weekly rad  Txs,  Abdomen  13 completd, is having nausea,request mediation to be called into [facility] , takes lomotil for his diarrhea, takes 8 tabs daily stated, appetite goes up and down, eats high fiber cereal in am, sugessted he stop that, Gave low fiber diet flyer sheet,  Did get his cortisone injection in his knee yesterday and pain is decreasing significantly feelin g better today 10:37 AM

## 2014-02-04 NOTE — Progress Notes (Signed)
   Department of Radiation Oncology  Phone:  [phone] Fax:        [phone]  Weekly Treatment Note    Name: [name] Date: 02/04/2014 MRN: [id number] DOB: 1946-07-30   Current dose: 23.4 Gy  Current fraction: 13   MEDICATIONS: Current Outpatient Prescriptions  Medication Sig Dispense Refill  . acetaminophen (TYLENOL) 325 MG tablet Take 2 tablets (650 mg total) by mouth every 6 (six) hours as needed for mild pain, moderate pain, fever or headache.      [name] amLODipine-benazepril (LOTREL) 5-10 MG per capsule Take 1 capsule by mouth every morning.      . diphenoxylate-atropine (LOMOTIL) 2.5-0.025 MG per tablet Take 1 tablet by mouth 4 (four) times daily as needed for diarrhea or loose stools.      . ferrous sulfate 325 (65 FE) MG tablet Take 325 mg by mouth 2 (two) times daily with a meal.      . glipiZIDE (GLUCOTROL) 5 MG tablet Take 5 mg by mouth daily before breakfast.      . hyaluronate sodium (RADIAPLEXRX) GEL Apply 1 application topically daily. Apply to abdomen daily after rad txs and on weekends      . levothyroxine (SYNTHROID, LEVOTHROID) 50 MCG tablet Take 50 mcg by mouth daily before breakfast.      . Multiple Vitamin (MULTIVITAMIN WITH MINERALS) TABS tablet Take 1 tablet by mouth daily.      [name] oxyCODONE-acetaminophen (PERCOCET/ROXICET) 5-325 MG per tablet Take 1-2 tablets by mouth every 4 (four) hours as needed for moderate pain.  50 tablet  0   No current facility-administered medications for this encounter.     ALLERGIES: Review of patient's allergies indicates no known allergies.   LABORATORY DATA:  Lab Results  Component Value Date   WBC 8.9 12/22/2013   HGB 9.1* 12/22/2013   HCT 28.8* 12/22/2013   MCV 76.2* 12/22/2013   PLT 463* 12/22/2013   Lab Results  Component Value Date   NA 136* 12/22/2013   K 4.2 12/22/2013   CL 100 12/22/2013   CO2 26 12/22/2013   Lab Results  Component Value Date   ALT 23 12/07/2013   AST 29 12/07/2013   ALKPHOS 134*  12/07/2013   BILITOT 0.2* 12/07/2013     NARRATIVE: Dean Owens was seen today for weekly treatment management. The chart was checked and the patient's films were reviewed. The patient states he is doing fairly well overall. He continues to take Lomotil for his diarrhea. He does complain of some nausea on occasion. It is not every day but he feels that he needs something for this.  PHYSICAL EXAMINATION: weight is 197 lb 12.8 oz (89.721 kg). His oral temperature is 97.7 F (36.5 C). His blood pressure is 162/80 and his pulse is 94. His respiration is 20.        ASSESSMENT: The patient is doing satisfactorily with treatment.  PLAN: We will continue with the patient's radiation treatment as planned. I have given him a prescription for Zofran. We are treating a large abdominal area and I believe aggressively treating his nausea is appropriate.

## 2014-02-07 ENCOUNTER — Ambulatory Visit
Admission: RE | Admit: 2014-02-07 | Discharge: 2014-02-07 | Disposition: A | Discharge status: Home / Self Care | Payer: Medicare Other | Source: Ambulatory Visit | Attending: Radiation Oncology | Admitting: Radiation Oncology

## 2014-02-07 ENCOUNTER — Ambulatory Visit: Payer: Medicare Other

## 2014-02-07 DIAGNOSIS — Z510 Encounter for antineoplastic radiation therapy: Secondary | ICD-10-CM | POA: Diagnosis not present

## 2014-02-08 ENCOUNTER — Ambulatory Visit: Payer: Medicare Other

## 2014-02-08 ENCOUNTER — Ambulatory Visit
Admission: RE | Admit: 2014-02-08 | Discharge: 2014-02-08 | Disposition: A | Discharge status: Home / Self Care | Payer: Medicare Other | Source: Ambulatory Visit | Attending: Radiation Oncology | Admitting: Radiation Oncology

## 2014-02-08 DIAGNOSIS — Z510 Encounter for antineoplastic radiation therapy: Secondary | ICD-10-CM | POA: Diagnosis not present

## 2014-02-09 ENCOUNTER — Ambulatory Visit: Payer: Medicare Other

## 2014-02-09 ENCOUNTER — Ambulatory Visit
Admission: RE | Admit: 2014-02-09 | Discharge: 2014-02-09 | Disposition: A | Discharge status: Home / Self Care | Payer: Medicare Other | Source: Ambulatory Visit | Attending: Radiation Oncology | Admitting: Radiation Oncology

## 2014-02-09 ENCOUNTER — Encounter: Payer: Self-pay | Admitting: Radiation Oncology

## 2014-02-09 DIAGNOSIS — Z510 Encounter for antineoplastic radiation therapy: Secondary | ICD-10-CM | POA: Diagnosis not present

## 2014-02-10 ENCOUNTER — Ambulatory Visit: Payer: Medicare Other

## 2014-02-10 ENCOUNTER — Ambulatory Visit
Admission: RE | Admit: 2014-02-10 | Discharge: 2014-02-10 | Disposition: A | Discharge status: Home / Self Care | Payer: Medicare Other | Source: Ambulatory Visit | Attending: Radiation Oncology | Admitting: Radiation Oncology

## 2014-02-10 DIAGNOSIS — C189 Malignant neoplasm of colon, unspecified: Secondary | ICD-10-CM

## 2014-02-11 ENCOUNTER — Ambulatory Visit
Admission: RE | Admit: 2014-02-11 | Discharge: 2014-02-11 | Disposition: A | Discharge status: Home / Self Care | Payer: Medicare Other | Source: Ambulatory Visit | Attending: Radiation Oncology | Admitting: Radiation Oncology

## 2014-02-11 ENCOUNTER — Ambulatory Visit: Payer: Medicare Other

## 2014-02-11 DIAGNOSIS — Z510 Encounter for antineoplastic radiation therapy: Secondary | ICD-10-CM | POA: Diagnosis not present

## 2014-02-14 ENCOUNTER — Encounter: Payer: Self-pay | Admitting: Radiation Oncology

## 2014-02-14 ENCOUNTER — Ambulatory Visit: Payer: Medicare Other

## 2014-02-14 ENCOUNTER — Ambulatory Visit
Admission: RE | Admit: 2014-02-14 | Discharge: 2014-02-14 | Disposition: A | Discharge status: Home / Self Care | Payer: Medicare Other | Source: Ambulatory Visit | Attending: Radiation Oncology | Admitting: Radiation Oncology

## 2014-02-14 DIAGNOSIS — Z510 Encounter for antineoplastic radiation therapy: Secondary | ICD-10-CM | POA: Diagnosis not present

## 2014-02-14 NOTE — Progress Notes (Signed)
Faxed STD completed forms and notes to [place] [phone]

## 2014-02-15 ENCOUNTER — Ambulatory Visit
Admission: RE | Admit: 2014-02-15 | Discharge: 2014-02-15 | Disposition: A | Discharge status: Home / Self Care | Payer: Medicare Other | Source: Ambulatory Visit | Attending: Radiation Oncology | Admitting: Radiation Oncology

## 2014-02-15 ENCOUNTER — Ambulatory Visit: Payer: Medicare Other

## 2014-02-15 DIAGNOSIS — Z510 Encounter for antineoplastic radiation therapy: Secondary | ICD-10-CM | POA: Diagnosis not present

## 2014-02-16 ENCOUNTER — Ambulatory Visit: Payer: Medicare Other

## 2014-02-16 ENCOUNTER — Encounter: Payer: Self-pay | Admitting: Radiation Oncology

## 2014-02-16 ENCOUNTER — Ambulatory Visit
Admission: RE | Admit: 2014-02-16 | Discharge: 2014-02-16 | Disposition: A | Discharge status: Home / Self Care | Payer: Medicare Other | Source: Ambulatory Visit | Attending: Radiation Oncology | Admitting: Radiation Oncology

## 2014-02-16 VITALS — BP 126/66 | HR 84 | Temp 97.6°F | Resp 20 | Wt 195.3 lb

## 2014-02-16 DIAGNOSIS — C480 Malignant neoplasm of retroperitoneum: Secondary | ICD-10-CM

## 2014-02-16 DIAGNOSIS — Z510 Encounter for antineoplastic radiation therapy: Secondary | ICD-10-CM | POA: Diagnosis not present

## 2014-02-16 NOTE — Progress Notes (Signed)
Weekly rad tx abdomen,  MD seen first, vitals taken afterwards, takes lomotil 2tabs 4x day still gets up every other hour at night with   diarrhea,zofran  Takes every day now since this Monday,"It helps me get through the day without nausea", appetite good 11:08 AM

## 2014-02-16 NOTE — Progress Notes (Signed)
   Department of Radiation Oncology  Phone:  [phone] Fax:        [phone]  Weekly Treatment Note    Name: Dean Owens Date: 02/16/2014 MRN: [id number] DOB: [birth year]   Current dose: 36 Gy  Current fraction: 20   MEDICATIONS: Current Outpatient Prescriptions  Medication Sig Dispense Refill  . acetaminophen (TYLENOL) 325 MG tablet Take 2 tablets (650 mg total) by mouth every 6 (six) hours as needed for mild pain, moderate pain, fever or headache.      [name] amLODipine-benazepril (LOTREL) 5-10 MG per capsule Take 1 capsule by mouth every morning.      . diphenoxylate-atropine (LOMOTIL) 2.5-0.025 MG per tablet Take 1 tablet by mouth 4 (four) times daily as needed for diarrhea or loose stools.      . ferrous sulfate 325 (65 FE) MG tablet Take 325 mg by mouth 2 (two) times daily with a meal.      . glipiZIDE (GLUCOTROL) 5 MG tablet Take 5 mg by mouth daily before breakfast.      . hyaluronate sodium (RADIAPLEXRX) GEL Apply 1 application topically daily. Apply to abdomen daily after rad txs and on weekends      . levothyroxine (SYNTHROID, LEVOTHROID) 50 MCG tablet Take 50 mcg by mouth daily before breakfast.      . Multiple Vitamin (MULTIVITAMIN WITH MINERALS) TABS tablet Take 1 tablet by mouth daily.      . ondansetron (ZOFRAN) 8 MG tablet Take 1 tablet (8 mg total) by mouth every 12 (twelve) hours as needed for nausea or vomiting.  30 tablet  0  . oxyCODONE-acetaminophen (PERCOCET/ROXICET) 5-325 MG per tablet Take 1-2 tablets by mouth every 4 (four) hours as needed for moderate pain.  50 tablet  0   No current facility-administered medications for this encounter.     ALLERGIES: Review of patient's allergies indicates no known allergies.   LABORATORY DATA:  Lab Results  Component Value Date   WBC 8.9 12/22/2013   HGB 9.1* 12/22/2013   HCT 28.8* 12/22/2013   MCV 76.2* 12/22/2013   PLT 463* 12/22/2013   Lab Results  Component Value Date   NA 136* 12/22/2013   K 4.2  12/22/2013   CL 100 12/22/2013   CO2 26 12/22/2013   Lab Results  Component Value Date   ALT 23 12/07/2013   AST 29 12/07/2013   ALKPHOS 134* 12/07/2013   BILITOT 0.2* 12/07/2013     NARRATIVE: Dean Owens was seen today for weekly treatment management. The chart was checked and the patient's films were reviewed. The patient continues to have diarrhea. He takes Lomotil regularly and this has continued to be an issue for him. He knows to drink plenty of fluids. Minimal nausea which has been well-controlled with his current medication.  PHYSICAL EXAMINATION: weight is 195 lb 4.8 oz (88.587 kg). His oral temperature is 97.6 F (36.4 C). His blood pressure is 126/66 and his pulse is 84. His respiration is 20.        ASSESSMENT: The patient is doing satisfactorily with treatment.  PLAN: We will continue with the patient's radiation treatment as planned.

## 2014-02-17 ENCOUNTER — Ambulatory Visit: Payer: Medicare Other

## 2014-02-17 ENCOUNTER — Ambulatory Visit
Admission: RE | Admit: 2014-02-17 | Discharge: 2014-02-17 | Disposition: A | Discharge status: Home / Self Care | Payer: Medicare Other | Source: Ambulatory Visit | Attending: Radiation Oncology | Admitting: Radiation Oncology

## 2014-02-17 DIAGNOSIS — Z510 Encounter for antineoplastic radiation therapy: Secondary | ICD-10-CM | POA: Diagnosis not present

## 2014-02-18 ENCOUNTER — Ambulatory Visit
Admission: RE | Admit: 2014-02-18 | Discharge: 2014-02-18 | Disposition: A | Discharge status: Home / Self Care | Payer: Medicare Other | Source: Ambulatory Visit | Attending: Radiation Oncology | Admitting: Radiation Oncology

## 2014-02-18 ENCOUNTER — Ambulatory Visit: Payer: Medicare Other

## 2014-02-18 ENCOUNTER — Encounter: Payer: Self-pay | Admitting: Radiation Oncology

## 2014-02-18 VITALS — Wt 196.4 lb

## 2014-02-18 DIAGNOSIS — Z510 Encounter for antineoplastic radiation therapy: Secondary | ICD-10-CM | POA: Diagnosis not present

## 2014-02-18 DIAGNOSIS — C480 Malignant neoplasm of retroperitoneum: Secondary | ICD-10-CM

## 2014-02-18 NOTE — Progress Notes (Signed)
   Department of Radiation Oncology  Phone:  [phone] Fax:        [phone]  Weekly Treatment Note    Name: Dean Owens Date: 02/18/2014 MRN: [id number] DOB: 03-Aug-1946   Current dose: 39.6 Gy  Current fraction:22   MEDICATIONS: Current Outpatient Prescriptions  Medication Sig Dispense Refill  . acetaminophen (TYLENOL) 325 MG tablet Take 2 tablets (650 mg total) by mouth every 6 (six) hours as needed for mild pain, moderate pain, fever or headache.      [name] amLODipine-benazepril (LOTREL) 5-10 MG per capsule Take 1 capsule by mouth every morning.      . diphenoxylate-atropine (LOMOTIL) 2.5-0.025 MG per tablet Take 1 tablet by mouth 4 (four) times daily as needed for diarrhea or loose stools.      . ferrous sulfate 325 (65 FE) MG tablet Take 325 mg by mouth 2 (two) times daily with a meal.      . glipiZIDE (GLUCOTROL) 5 MG tablet Take 5 mg by mouth daily before breakfast.      . hyaluronate sodium (RADIAPLEXRX) GEL Apply 1 application topically daily. Apply to abdomen daily after rad txs and on weekends      . levothyroxine (SYNTHROID, LEVOTHROID) 50 MCG tablet Take 50 mcg by mouth daily before breakfast.      . Multiple Vitamin (MULTIVITAMIN WITH MINERALS) TABS tablet Take 1 tablet by mouth daily.      . ondansetron (ZOFRAN) 8 MG tablet Take 1 tablet (8 mg total) by mouth every 12 (twelve) hours as needed for nausea or vomiting.  30 tablet  0  . oxyCODONE-acetaminophen (PERCOCET/ROXICET) 5-325 MG per tablet Take 1-2 tablets by mouth every 4 (four) hours as needed for moderate pain.  50 tablet  0   No current facility-administered medications for this encounter.     ALLERGIES: Review of patient's allergies indicates no known allergies.   LABORATORY DATA:  Lab Results  Component Value Date   WBC 8.9 12/22/2013   HGB 9.1* 12/22/2013   HCT 28.8* 12/22/2013   MCV 76.2* 12/22/2013   PLT 463* 12/22/2013   Lab Results  Component Value Date   NA 136* 12/22/2013   K 4.2  12/22/2013   CL 100 12/22/2013   CO2 26 12/22/2013   Lab Results  Component Value Date   ALT 23 12/07/2013   AST 29 12/07/2013   ALKPHOS 134* 12/07/2013   BILITOT 0.2* 12/07/2013     NARRATIVE: Dean Owens was seen today for weekly treatment management. The chart was checked and the patient's films were reviewed. The patient is clinically stable. No new complaints. No abdominal discomfort.  PHYSICAL EXAMINATION: Alert, in no acute distress      ASSESSMENT: The patient is doing satisfactorily with treatment.  PLAN: We will continue with the patient's radiation treatment as planned.

## 2014-02-18 NOTE — Progress Notes (Signed)
Weekly rad txs 22 complee abdomen, no significan t chnages from when seen 2 days ago sttaed patint, no vitals taken 10:58 AM

## 2014-02-21 ENCOUNTER — Ambulatory Visit: Payer: Medicare Other

## 2014-02-21 ENCOUNTER — Ambulatory Visit
Admission: RE | Admit: 2014-02-21 | Discharge: 2014-02-21 | Disposition: A | Discharge status: Home / Self Care | Payer: Medicare Other | Source: Ambulatory Visit | Attending: Radiation Oncology | Admitting: Radiation Oncology

## 2014-02-21 DIAGNOSIS — Z510 Encounter for antineoplastic radiation therapy: Secondary | ICD-10-CM | POA: Diagnosis not present

## 2014-02-22 ENCOUNTER — Ambulatory Visit
Admission: RE | Admit: 2014-02-22 | Discharge: 2014-02-22 | Disposition: A | Discharge status: Home / Self Care | Payer: Medicare Other | Source: Ambulatory Visit | Attending: Radiation Oncology | Admitting: Radiation Oncology

## 2014-02-22 ENCOUNTER — Ambulatory Visit: Payer: Medicare Other

## 2014-02-22 DIAGNOSIS — Z510 Encounter for antineoplastic radiation therapy: Secondary | ICD-10-CM | POA: Diagnosis not present

## 2014-02-23 ENCOUNTER — Ambulatory Visit: Payer: Medicare Other

## 2014-02-23 ENCOUNTER — Ambulatory Visit
Admission: RE | Admit: 2014-02-23 | Discharge: 2014-02-23 | Disposition: A | Discharge status: Home / Self Care | Payer: Medicare Other | Source: Ambulatory Visit | Attending: Radiation Oncology | Admitting: Radiation Oncology

## 2014-02-23 DIAGNOSIS — Z510 Encounter for antineoplastic radiation therapy: Secondary | ICD-10-CM | POA: Diagnosis not present

## 2014-02-24 ENCOUNTER — Encounter: Payer: Self-pay | Admitting: Radiation Oncology

## 2014-02-24 ENCOUNTER — Ambulatory Visit
Admission: RE | Admit: 2014-02-24 | Discharge: 2014-02-24 | Disposition: A | Discharge status: Home / Self Care | Payer: Medicare Other | Source: Ambulatory Visit | Attending: Radiation Oncology | Admitting: Radiation Oncology

## 2014-02-24 ENCOUNTER — Ambulatory Visit: Payer: Medicare Other

## 2014-02-24 VITALS — BP 125/62 | HR 85 | Temp 97.5°F | Resp 20 | Wt 197.7 lb

## 2014-02-24 DIAGNOSIS — C480 Malignant neoplasm of retroperitoneum: Secondary | ICD-10-CM

## 2014-02-24 DIAGNOSIS — Z510 Encounter for antineoplastic radiation therapy: Secondary | ICD-10-CM | POA: Diagnosis not present

## 2014-02-24 NOTE — Progress Notes (Signed)
   Department of Radiation Oncology  Phone:  [phone] Fax:        [phone]  Weekly Treatment Note    Name: Dean Owens Date: 02/24/2014 MRN: [id number] DOB: 1946/06/22   Current dose: 46.8 Gy  Current fraction: 26   MEDICATIONS: Current Outpatient Prescriptions  Medication Sig Dispense Refill  . acetaminophen (TYLENOL) 325 MG tablet Take 2 tablets (650 mg total) by mouth every 6 (six) hours as needed for mild pain, moderate pain, fever or headache.      [name] amLODipine-benazepril (LOTREL) 5-10 MG per capsule Take 1 capsule by mouth every morning.      . diphenoxylate-atropine (LOMOTIL) 2.5-0.025 MG per tablet Take 1 tablet by mouth 4 (four) times daily as needed for diarrhea or loose stools.      . ferrous sulfate 325 (65 FE) MG tablet Take 325 mg by mouth 2 (two) times daily with a meal.      . glipiZIDE (GLUCOTROL) 5 MG tablet Take 5 mg by mouth daily before breakfast.      . hyaluronate sodium (RADIAPLEXRX) GEL Apply 1 application topically daily. Apply to abdomen daily after rad txs and on weekends      . levothyroxine (SYNTHROID, LEVOTHROID) 50 MCG tablet Take 50 mcg by mouth daily before breakfast.      . Multiple Vitamin (MULTIVITAMIN WITH MINERALS) TABS tablet Take 1 tablet by mouth daily.      . ondansetron (ZOFRAN) 8 MG tablet Take 1 tablet (8 mg total) by mouth every 12 (twelve) hours as needed for nausea or vomiting.  30 tablet  0  . oxyCODONE-acetaminophen (PERCOCET/ROXICET) 5-325 MG per tablet Take 1-2 tablets by mouth every 4 (four) hours as needed for moderate pain.  50 tablet  0   No current facility-administered medications for this encounter.     ALLERGIES: Review of patient's allergies indicates no known allergies.   LABORATORY DATA:  Lab Results  Component Value Date   WBC 8.9 12/22/2013   HGB 9.1* 12/22/2013   HCT 28.8* 12/22/2013   MCV 76.2* 12/22/2013   PLT 463* 12/22/2013   Lab Results  Component Value Date   NA 136* 12/22/2013   K 4.2  12/22/2013   CL 100 12/22/2013   CO2 26 12/22/2013   Lab Results  Component Value Date   ALT 23 12/07/2013   AST 29 12/07/2013   ALKPHOS 134* 12/07/2013   BILITOT 0.2* 12/07/2013     NARRATIVE: Dean Owens was seen today for weekly treatment management. The chart was checked and the patient's films were reviewed. The patient is doing very well overall, especially considering the area of treatment. He continues to have some diarrhea, unchanged. He does complain of fatigue and decreased appetite. He has 2 fractions remaining.  PHYSICAL EXAMINATION: weight is 197 lb 11.2 oz (89.676 kg). His oral temperature is 97.5 F (36.4 C). His blood pressure is 125/62 and his pulse is 85. His respiration is 20.        ASSESSMENT: The patient is doing satisfactorily with treatment.  PLAN: We will continue with the patient's radiation treatment as planned. Please with how the patient is doing. He will finish his treatment next week and followup with me in 1 month. I am hopeful that his appetite, fatigue, and diarrhea will improve after treatment is completed.

## 2014-02-24 NOTE — Progress Notes (Signed)
Weekly rad txs abdomen, 26th tx completed, still having diarrhea, takes 8 lomotil total daily, gets 3 hours sleep some nights, sothernights up every other hour with diarrhea, nausea  At bay with taking his zofran daily, very fatigued appetite fair,   10:52 AM  10:51 AM

## 2014-02-28 ENCOUNTER — Ambulatory Visit: Payer: Medicare Other

## 2014-02-28 ENCOUNTER — Ambulatory Visit
Admission: RE | Admit: 2014-02-28 | Discharge: 2014-02-28 | Disposition: A | Discharge status: Home / Self Care | Payer: Medicare Other | Source: Ambulatory Visit | Attending: Radiation Oncology | Admitting: Radiation Oncology

## 2014-02-28 DIAGNOSIS — Z510 Encounter for antineoplastic radiation therapy: Secondary | ICD-10-CM | POA: Diagnosis not present

## 2014-03-01 ENCOUNTER — Ambulatory Visit: Payer: Medicare Other

## 2014-03-01 ENCOUNTER — Ambulatory Visit
Admission: RE | Admit: 2014-03-01 | Discharge: 2014-03-01 | Disposition: A | Discharge status: Home / Self Care | Payer: Medicare Other | Source: Ambulatory Visit | Attending: Radiation Oncology | Admitting: Radiation Oncology

## 2014-03-01 ENCOUNTER — Encounter: Payer: Self-pay | Admitting: Radiation Oncology

## 2014-03-01 DIAGNOSIS — Z510 Encounter for antineoplastic radiation therapy: Secondary | ICD-10-CM | POA: Diagnosis not present

## 2014-03-02 ENCOUNTER — Ambulatory Visit: Payer: Medicare Other

## 2014-03-07 ENCOUNTER — Ambulatory Visit (HOSPITAL_BASED_OUTPATIENT_CLINIC_OR_DEPARTMENT_OTHER): Payer: Medicare Other | Admitting: Oncology

## 2014-03-07 ENCOUNTER — Telehealth: Payer: Self-pay | Admitting: Oncology

## 2014-03-07 VITALS — BP 130/61 | HR 91 | Temp 98.2°F | Resp 18 | Ht 71.75 in | Wt 198.4 lb

## 2014-03-07 DIAGNOSIS — E119 Type 2 diabetes mellitus without complications: Secondary | ICD-10-CM

## 2014-03-07 DIAGNOSIS — C179 Malignant neoplasm of small intestine, unspecified: Secondary | ICD-10-CM

## 2014-03-07 DIAGNOSIS — D649 Anemia, unspecified: Secondary | ICD-10-CM

## 2014-03-07 DIAGNOSIS — I10 Essential (primary) hypertension: Secondary | ICD-10-CM

## 2014-03-07 DIAGNOSIS — Z800 Family history of malignant neoplasm of digestive organs: Secondary | ICD-10-CM

## 2014-03-07 DIAGNOSIS — D509 Iron deficiency anemia, unspecified: Secondary | ICD-10-CM

## 2014-03-07 DIAGNOSIS — E039 Hypothyroidism, unspecified: Secondary | ICD-10-CM

## 2014-03-07 NOTE — Telephone Encounter (Signed)
lmonvm advising the pt of his 4 mnth f/u appt in November.

## 2014-03-07 NOTE — Progress Notes (Signed)
  [facility] OFFICE PROGRESS NOTE   Diagnosis: Small bowel carcinoma, liposarcoma  INTERVAL HISTORY:   Dean Owens returns as scheduled. He underwent resection of the right retroperitoneal sarcoma on 12/14/2013. The mass was adherent with the anterior psoas and psoas tendon. The pathology ([id number]) confirmed a high-grade liposarcoma. The anterior, posterior, and lateral margins were positive. There was loss of MSH2 and MSH6 expression.  He completed adjuvant radiation 03/01/2012. Dean Owens. A nodular area at the right lower side of the midline scar has decreased in size.  He has a firm nodular skin lesion at the low anterior chest. He plans to have this removed by Dr. Ronnald Ramp.    Objective:  Vital signs in last 24 hours:  Blood pressure 130/61, pulse 91, temperature 98.2 F (36.8 C), temperature source Oral, resp. rate 18, height 5' 11.75" (1.822 m), weight 198 lb 6.4 oz (89.994 kg), SpO2 100.00%.    HEENT: Neck without mass Lymphatics: No cervical, supraclavicular, or inguinal nodes Resp: Lungs clear bilaterally Cardio: Regular rate and rhythm GI:  no hepatosplenomegaly, nontender, no mass, firm nodularity to the right of the lower aspect of the midline scar. Vascular: No leg edema  Skin: 3-4 mm erythematous nodular lesion at the low anterior chest/upper abdomen    Lab Results:  Lab Results  Component Value Date   WBC 8.9 12/22/2013   HGB 9.1* 12/22/2013   HCT 28.8* 12/22/2013   MCV 76.2* 12/22/2013   PLT 463* 12/22/2013   NEUTROABS 8.6* 12/07/2013    Lab Results  Component Value Date   CEA 2.6 11/16/2013     Medications: I have reviewed the patient's current medications.  Assessment/Plan: 1. 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 Colon cancer X3, 1997 and 1999, status post  an intra-abdominal colectomy with ileorectal anastomosis in May 1999  3. Iron deficiency anemia  4. Diabetes  5. Hypothyroidism  6. Hypertension  7. 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 Right retroperitoneal mass on a CT 10/12/2013   Biopsy 11/15/2013 confirmed a spindle cell neoplasm consistent with sarcoma  Status post resection of the retroperitoneal mass 12/14/2013 with the pathology revealing a high-grade liposarcoma with positive surgical margins, loss of MSH2 and MSH6 expression   Adjuvant radiation completed 03/01/2014   Disposition:  Dean Owens underwent resection of a high-grade retroperitoneal liposarcoma 12/14/2013. Surgical margins were positive. He completed adjuvant radiation 03/01/2014. He remains at high risk of developing recurrent sarcoma. It appears the sarcoma is related to his history of hereditary non-polyposis colon cancer syndrome.  Dean Owens is scheduled for an office visit with Dr. Lisbeth Renshaw on 04/06/2014. We will check a CEA and CBC that day. He will return for an office visit in 4 months. We will plan for a surveillance CT of the abdomen/pelvis 6-9 months following surgery.  Betsy Coder, MD  03/07/2014  10:46 AM

## 2014-03-10 ENCOUNTER — Other Ambulatory Visit: Payer: Self-pay | Admitting: Dermatology

## 2014-03-28 NOTE — Progress Notes (Signed)
  Radiation Oncology         [phone]) [phone] ________________________________  Name: Dean Owens MRN: [id number]  Date: 03/01/2014  DOB: 1945/12/08  End of Treatment Note  Diagnosis:   Retroperitoneal sarcoma     Indication for treatment:  Curative       Radiation treatment dates:    01/19/2014 through 03/01/2014  Site/dose:   The patient was treated to a large area involving the right abdomen and extending to the right pelvis. He was treated in a post operative manner to a dose of 50.4 gray at 1.8 gray per fraction. The patient was treated using IM and image guidance on a daily basis.RT   Narrative: The patient tolerated radiation treatment relatively well.   The patient really did very well in terms of not having major issues with GI toxicity including nausea. Some effect on appetite by the end of treatment.   Plan: The patient has completed radiation treatment. The patient will return to radiation oncology clinic for routine followup in one month. I advised the patient to call or return sooner if they have any questions or concerns related to their recovery or treatment. ________________________________  Jodelle Gross, M.D., Ph.D.

## 2014-04-04 ENCOUNTER — Encounter: Payer: Self-pay | Admitting: Radiation Oncology

## 2014-04-06 ENCOUNTER — Encounter: Payer: Self-pay | Admitting: Radiation Oncology

## 2014-04-06 ENCOUNTER — Ambulatory Visit
Admission: RE | Admit: 2014-04-06 | Discharge: 2014-04-06 | Disposition: A | Discharge status: Home / Self Care | Payer: Medicare Other | Source: Ambulatory Visit | Attending: Radiation Oncology | Admitting: Radiation Oncology

## 2014-04-06 VITALS — BP 142/57 | HR 107 | Temp 97.8°F | Resp 20 | Ht 71.75 in | Wt 203.2 lb

## 2014-04-06 DIAGNOSIS — C480 Malignant neoplasm of retroperitoneum: Secondary | ICD-10-CM

## 2014-04-06 HISTORY — DX: Personal history of irradiation: Z92.3

## 2014-04-06 NOTE — Progress Notes (Signed)
  Radiation Oncology         [phone]) [phone] ________________________________  Name: Dean Owens MRN: [id number]  Date: 04/06/2014  DOB: [birth year]  Follow-Up Visit Note  CC: Horton Finer, MD  [name], MD  Diagnosis:   Retroperitoneal sarcoma  Interval Since Last Radiation:  One month   Narrative:  The patient returns today for routine follow-up.  The patient states he is doing fairly well. He notes no nausea for the last couple of weeks. No change in diarrhea which has been an ongoing issue for him. Appetite good. He does continue to complain of some ongoing fatigue.                              ALLERGIES:  has No Known Allergies.  Meds: Current Outpatient Prescriptions  Medication Sig Dispense Refill  . acetaminophen (TYLENOL) 325 MG tablet Take 2 tablets (650 mg total) by mouth every 6 (six) hours as needed for mild pain, moderate pain, fever or headache.      [name] amLODipine-benazepril (LOTREL) 5-10 MG per capsule Take 1 capsule by mouth every morning.      . diphenoxylate-atropine (LOMOTIL) 2.5-0.025 MG per tablet Take 1 tablet by mouth 4 (four) times daily as needed for diarrhea or loose stools.      . ferrous sulfate 325 (65 FE) MG tablet Take 325 mg by mouth daily.       [name] glipiZIDE (GLUCOTROL) 5 MG tablet Take 5 mg by mouth daily before breakfast.      . hyaluronate sodium (RADIAPLEXRX) GEL Apply 1 application topically daily. Apply to abdomen daily after rad txs and on weekends      . levothyroxine (SYNTHROID, LEVOTHROID) 50 MCG tablet Take 50 mcg by mouth daily before breakfast.      . Multiple Vitamin (MULTIVITAMIN WITH MINERALS) TABS tablet Take 1 tablet by mouth daily.      . ondansetron (ZOFRAN) 8 MG tablet Take 1 tablet (8 mg total) by mouth every 12 (twelve) hours as needed for nausea or vomiting.  30 tablet  0  . oxyCODONE-acetaminophen (PERCOCET/ROXICET) 5-325 MG per tablet Take 1-2 tablets by mouth every 4 (four) hours as needed for moderate pain.  50  tablet  0   No current facility-administered medications for this encounter.    Physical Findings: The patient is in no acute distress. Patient is alert and oriented.  height is 5' 11.75" (1.822 m) and weight is 203 lb 3.2 oz (92.171 kg). His oral temperature is 97.8 F (36.6 C). His blood pressure is 142/57 and his pulse is 107. His respiration is 20. .     Lab Findings: Lab Results  Component Value Date   WBC 8.9 12/22/2013   HGB 9.1* 12/22/2013   HCT 28.8* 12/22/2013   MCV 76.2* 12/22/2013   PLT 463* 12/22/2013     Radiographic Findings: No results found.  Impression:    The patient is doing satisfactorily one month after his course of adjuvant radiation. No ongoing issues related to GI toxicity from radiation treatment. He did excellent during treatment in terms of side effects given the volume of the target area.  Plan:  The patient will followup in our clinic on a when necessary basis.   Jodelle Gross, M.D., Ph.D.

## 2014-04-06 NOTE — Progress Notes (Signed)
Follow up s/p rad txs retroperitoneal sarcoma , no c/o nuasea since past 10 days, still fatigued, no pain, , appetite good 1:55 PM

## 2014-05-16 ENCOUNTER — Telehealth: Payer: Self-pay | Admitting: Nurse Practitioner

## 2014-05-16 ENCOUNTER — Encounter: Payer: Self-pay | Admitting: Nurse Practitioner

## 2014-05-16 ENCOUNTER — Other Ambulatory Visit: Payer: Self-pay | Admitting: Nurse Practitioner

## 2014-05-16 ENCOUNTER — Ambulatory Visit (HOSPITAL_BASED_OUTPATIENT_CLINIC_OR_DEPARTMENT_OTHER): Payer: Medicare Other | Admitting: Nurse Practitioner

## 2014-05-16 ENCOUNTER — Telehealth: Payer: Self-pay | Admitting: *Deleted

## 2014-05-16 ENCOUNTER — Ambulatory Visit (HOSPITAL_COMMUNITY)
Admission: RE | Admit: 2014-05-16 | Discharge: 2014-05-16 | Disposition: A | Discharge status: Home / Self Care | Payer: Medicare Other | Source: Ambulatory Visit | Attending: Nurse Practitioner | Admitting: Nurse Practitioner

## 2014-05-16 ENCOUNTER — Ambulatory Visit (HOSPITAL_BASED_OUTPATIENT_CLINIC_OR_DEPARTMENT_OTHER): Payer: Medicare Other

## 2014-05-16 VITALS — BP 142/62 | HR 112 | Temp 97.6°F | Resp 20 | Ht 71.75 in | Wt 202.3 lb

## 2014-05-16 DIAGNOSIS — R740 Nonspecific elevation of levels of transaminase and lactic acid dehydrogenase [LDH]: Secondary | ICD-10-CM

## 2014-05-16 DIAGNOSIS — R109 Unspecified abdominal pain: Secondary | ICD-10-CM | POA: Insufficient documentation

## 2014-05-16 DIAGNOSIS — C189 Malignant neoplasm of colon, unspecified: Secondary | ICD-10-CM

## 2014-05-16 DIAGNOSIS — C480 Malignant neoplasm of retroperitoneum: Secondary | ICD-10-CM

## 2014-05-16 DIAGNOSIS — E8809 Other disorders of plasma-protein metabolism, not elsewhere classified: Secondary | ICD-10-CM | POA: Insufficient documentation

## 2014-05-16 DIAGNOSIS — N289 Disorder of kidney and ureter, unspecified: Secondary | ICD-10-CM

## 2014-05-16 DIAGNOSIS — M542 Cervicalgia: Secondary | ICD-10-CM | POA: Insufficient documentation

## 2014-05-16 DIAGNOSIS — E875 Hyperkalemia: Secondary | ICD-10-CM

## 2014-05-16 DIAGNOSIS — R7401 Elevation of levels of liver transaminase levels: Secondary | ICD-10-CM

## 2014-05-16 DIAGNOSIS — D72829 Elevated white blood cell count, unspecified: Secondary | ICD-10-CM | POA: Insufficient documentation

## 2014-05-16 DIAGNOSIS — R630 Anorexia: Secondary | ICD-10-CM | POA: Insufficient documentation

## 2014-05-16 DIAGNOSIS — R7402 Elevation of levels of lactic acid dehydrogenase (LDH): Secondary | ICD-10-CM

## 2014-05-16 DIAGNOSIS — R110 Nausea: Secondary | ICD-10-CM

## 2014-05-16 DIAGNOSIS — R197 Diarrhea, unspecified: Secondary | ICD-10-CM | POA: Insufficient documentation

## 2014-05-16 LAB — CBC WITH DIFFERENTIAL/PLATELET
BASO%: 0.9 % (ref 0.0–2.0)
Basophils Absolute: 0.2 10*3/uL — ABNORMAL HIGH (ref 0.0–0.1)
EOS ABS: 0.2 10*3/uL (ref 0.0–0.5)
EOS%: 0.7 % (ref 0.0–7.0)
HCT: 34.4 % — ABNORMAL LOW (ref 38.4–49.9)
HGB: 11 g/dL — ABNORMAL LOW (ref 13.0–17.1)
LYMPH%: 6 % — ABNORMAL LOW (ref 14.0–49.0)
MCH: 26.4 pg — ABNORMAL LOW (ref 27.2–33.4)
MCHC: 32 g/dL (ref 32.0–36.0)
MCV: 82.6 fL (ref 79.3–98.0)
MONO#: 1.2 10*3/uL — ABNORMAL HIGH (ref 0.1–0.9)
MONO%: 5.8 % (ref 0.0–14.0)
NEUT%: 86.6 % — ABNORMAL HIGH (ref 39.0–75.0)
NEUTROS ABS: 18.4 10*3/uL — AB (ref 1.5–6.5)
Platelets: 539 10*3/uL — ABNORMAL HIGH (ref 140–400)
RBC: 4.17 10*6/uL — ABNORMAL LOW (ref 4.20–5.82)
RDW: 18.6 % — ABNORMAL HIGH (ref 11.0–14.6)
WBC: 21.2 10*3/uL — AB (ref 4.0–10.3)
lymph#: 1.3 10*3/uL (ref 0.9–3.3)

## 2014-05-16 LAB — BASIC METABOLIC PANEL (CC13)
ANION GAP: 11 meq/L (ref 3–11)
BUN: 39.3 mg/dL — ABNORMAL HIGH (ref 7.0–26.0)
CALCIUM: 8.5 mg/dL (ref 8.4–10.4)
CO2: 19 meq/L — AB (ref 22–29)
Chloride: 103 mEq/L (ref 98–109)
Creatinine: 1.7 mg/dL — ABNORMAL HIGH (ref 0.7–1.3)
Glucose: 112 mg/dl (ref 70–140)
Potassium: 5.6 mEq/L — ABNORMAL HIGH (ref 3.5–5.1)
SODIUM: 134 meq/L — AB (ref 136–145)

## 2014-05-16 LAB — COMPREHENSIVE METABOLIC PANEL (CC13)
ALBUMIN: 2.8 g/dL — AB (ref 3.5–5.0)
ALT: 18 U/L (ref 0–55)
AST: 81 U/L — AB (ref 5–34)
Alkaline Phosphatase: 393 U/L — ABNORMAL HIGH (ref 40–150)
Anion Gap: 14 mEq/L — ABNORMAL HIGH (ref 3–11)
BUN: 40.9 mg/dL — ABNORMAL HIGH (ref 7.0–26.0)
CO2: 18 mEq/L — ABNORMAL LOW (ref 22–29)
Calcium: 9.2 mg/dL (ref 8.4–10.4)
Chloride: 101 mEq/L (ref 98–109)
Creatinine: 2 mg/dL — ABNORMAL HIGH (ref 0.7–1.3)
GLUCOSE: 131 mg/dL (ref 70–140)
Potassium: 5.4 mEq/L — ABNORMAL HIGH (ref 3.5–5.1)
Sodium: 133 mEq/L — ABNORMAL LOW (ref 136–145)
Total Bilirubin: 0.49 mg/dL (ref 0.20–1.20)
Total Protein: 8 g/dL (ref 6.4–8.3)

## 2014-05-16 MED ORDER — ONDANSETRON 8 MG/50ML IVPB (CHCC)
8.0000 mg | Freq: Once | INTRAVENOUS | Status: AC
  Administered 2014-05-16: 8 mg via INTRAVENOUS

## 2014-05-16 MED ORDER — SODIUM POLYSTYRENE SULFONATE 15 GM/60ML PO SUSP
15.0000 g | Freq: Four times a day (QID) | ORAL | Status: DC
Start: 2014-05-16 — End: 2014-05-24

## 2014-05-16 MED ORDER — MORPHINE SULFATE 4 MG/ML IJ SOLN
INTRAMUSCULAR | Status: AC
  Filled 2014-05-16: qty 1

## 2014-05-16 MED ORDER — SODIUM CHLORIDE 0.9 % IV SOLN
INTRAVENOUS | Status: DC
  Administered 2014-05-16: 15:00:00 via INTRAVENOUS

## 2014-05-16 MED ORDER — FAMOTIDINE IN NACL 20-0.9 MG/50ML-% IV SOLN
20.0000 mg | Freq: Once | INTRAVENOUS | Status: AC
  Administered 2014-05-16: 20 mg via INTRAVENOUS

## 2014-05-16 MED ORDER — ONDANSETRON 8 MG/NS 50 ML IVPB
INTRAVENOUS | Status: AC
Start: 2014-05-16 — End: 2014-05-16
  Filled 2014-05-16: qty 8

## 2014-05-16 MED ORDER — SODIUM CHLORIDE 0.9 % IV SOLN
Freq: Once | INTRAVENOUS | Status: AC
  Administered 2014-05-16: 13:00:00 via INTRAVENOUS

## 2014-05-16 MED ORDER — FAMOTIDINE IN NACL 20-0.9 MG/50ML-% IV SOLN
INTRAVENOUS | Status: AC
  Filled 2014-05-16: qty 50

## 2014-05-16 MED ORDER — MORPHINE SULFATE 4 MG/ML IJ SOLN
2.0000 mg | INTRAMUSCULAR | Status: AC | PRN
  Administered 2014-05-16 (×2): 2 mg via INTRAVENOUS

## 2014-05-16 MED ORDER — OXYCODONE-ACETAMINOPHEN 5-325 MG PO TABS: 1.0000 | ORAL_TABLET | Freq: Four times a day (QID) | ORAL | Status: DC | PRN

## 2014-05-16 NOTE — Assessment & Plan Note (Signed)
Alkaline phosphatase has increased since last lab check.  This may be an indication of disease progression.  Will further evaluate with restaging scans tomorrow.

## 2014-05-16 NOTE — Assessment & Plan Note (Signed)
Patient has 3 specific areas to his right neck and upper shoulder region that bothering her father in him.  Patient is observed with full range of motion.  Will obtain CT of the chest/abdomen/pelvis to further evaluate.  May very well need an orthopedic referral in the future.

## 2014-05-16 NOTE — Assessment & Plan Note (Addendum)
Patient underwent a small bowel resection in February 2015; and completed radiation therapy 03/01/2014.  Do to acute onset abdominal discomfort and distention-plain film abdominal x-ray was ordered today which revealed no bowel obstruction.  It did show ascites however.  Patient will be ordered a diagnostic/therapeutic paracentesis tomorrow.  Will send paracentesis fluid for cytology.  Will also order a CT with contrast of the chest/abdomen/pelvis as soon as possible for further evaluation as well.  Patient's acute renal insufficiency will have to be addressed prior to obtaining CT.  Patient has plans to return first thing in the morning for repeat labs; and to make final arrangements regarding paracentesis and restaging scans.  CEA is pending result.

## 2014-05-16 NOTE — Progress Notes (Signed)
SYMPTOM MANAGEMENT CLINIC  No chief complaint on file.   HPI: Dean Owens 68 y.o. male diagnosed with colon cancer and liposarcoma.  Patient is status post small bowel resection in degree 2015 and radiation therapy completed in July 2015.  Patient called the cancer Center today requesting urgent care visit.  He is complaining of a three-day acute onset of abdominal pain and distention.  He is complaining of some mild nausea; but no vomiting.  He has had 3 days of diarrhea as well.  He feels fairly dehydrated today.  He states he has had minimal appetite whatsoever.  He denies any blood in stool.  He denies any dysuria, frequency, or other UTI symptoms.  He denies any recent fevers or chills.  Also, he is complaining of 3 specific sites to his right neck and right shoulder area but hasn't bothered him for the past week or so as well.  He denies any known injury to this area.  HPI  CURRENT THERAPY: No active treatment plan for patient.   ROS  Past Medical History  Diagnosis Date  . Hypertension   . Type 2 diabetes mellitus   . Hypothyroidism   . History of colon cancer NO RECURRENCE    S/P COLECTOMY X3  LAST ONE 1998--  [place]  . ED (erectile dysfunction) of organic origin   . Right knee DJD   . Eczema   . OA (osteoarthritis)     LEFT KNEE  . Wears glasses   . History of iron deficiency anemia   . History of squamous cell carcinoma excision   . Accelerated hypertension 10/21/2013  . Type II diabetes mellitus 10/21/2013  . Unspecified hypothyroidism 10/21/2013  . 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 Lynch syndrome   . History of nonmelanoma skin cancer   . Anemia   . Recurrent prostate carcinoma FOLLOWED BY DR Gaynelle Arabian    DX 2007  S/P RADIOACTIVE SEED IMPLANTS  . 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 Ileus     s/p op 3 days  . History of blood transfusion 09/2013    PRBC  . S/P radiation therapy 01/19/14-03/01/14    retroperitonela  sarcoma/abdomen    Past Surgical History  Procedure Laterality Date  . Givens capsule study N/A 07/05/2013    Procedure: GIVENS CAPSULE STUDY;  Surgeon: [name], MD;  Location: [place];  Service: Endoscopy;  Laterality: N/A;  . Radioactive prostate seed implants  OCT 2007  . Knee arthroscopy Bilateral 2006 &  2007  . Tonsillectomy  AS CHILD  . Appendectomy  AGE 22  . Cryoablation N/A 10/01/2013    Procedure: CRYO ABLATION PROSTATE;  Surgeon: [name], MD;  Location: [facility];  Service: Urology;  Laterality: N/A;  . Prostate surgery    . Right colectomy  09/07/1995  . Sigmoidectomy  11/13/95  . Total colectomy  12/29/1997    ileorectal anastomosis  . Laparotomy N/A 10/14/2013    Procedure: EXPLORATORY LAPAROTOMY;  Surgeon: [name], MD;  Location: [place];  Service: General;  Laterality: N/A;  . Bowel resection N/A 10/14/2013    Procedure: SMALL BOWEL RESECTION;  Surgeon: [name], MD;  Location: [place];  Service: General;  Laterality: N/A;  . Lysis of adhesion N/A 10/14/2013    Procedure: LYSIS OF ADHESION;  Surgeon: [name], MD;  Location: [place];  Service: General;  Laterality: N/A;  .  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 Cystoscopy with retrograde pyelogram, ureteroscopy and stent placement Right 12/14/2013    Procedure: CYSTOSCOPY WITH RIGHT RETROGRADE PYELOGRAM / [place];  Surgeon: [name], MD;  Location: [facility];  Service: Urology;  Laterality: Right;    has Colon cancer; Prostate cancer; Protein-calorie malnutrition, severe; Small bowel tumor; Accelerated hypertension; Type II diabetes mellitus; Unspecified hypothyroidism; Small bowel carcinoma; Lynch syndrome; Retroperitoneal liposarcoma; Sarcoma of retroperitoneum; Diarrhea; Anorexia; Nausea alone; Neck  pain; Renal insufficiency; Hyperkalemia; Hyperphosphatemia; Transaminitis; Hypoalbuminemia; and Leukocytosis, unspecified on his problem list.     has No Known Allergies.    Medication List       This list is accurate as of: 05/16/14  7:47 PM.  Always use your most recent med list.               acetaminophen 325 MG tablet  Commonly known as:  TYLENOL  Take 2 tablets (650 mg total) by mouth every 6 (six) hours as needed for mild pain, moderate pain, fever or headache.     amLODipine-benazepril 5-10 MG per capsule  Commonly known as:  LOTREL  Take 1 capsule by mouth every morning.     diphenoxylate-atropine 2.5-0.025 MG per tablet  Commonly known as:  LOMOTIL  Take 1 tablet by mouth 4 (four) times daily as needed for diarrhea or loose stools.     ferrous sulfate 325 (65 FE) MG tablet  Take 325 mg by mouth daily.     glipiZIDE 5 MG tablet  Commonly known as:  GLUCOTROL  Take 5 mg by mouth daily before breakfast.     levothyroxine 50 MCG tablet  Commonly known as:  SYNTHROID, LEVOTHROID  Take 50 mcg by mouth daily before breakfast.     multivitamin with minerals Tabs tablet  Take 1 tablet by mouth daily.     oxyCODONE-acetaminophen 5-325 MG per tablet  Commonly known as:  PERCOCET/ROXICET  Take 1-2 tablets by mouth every 6 (six) hours as needed for severe pain.     sodium polystyrene 15 GM/60ML suspension  Commonly known as:  KAYEXALATE  Take 60 mLs (15 g total) by mouth every 6 (six) hours.         PHYSICAL EXAMINATION  Blood pressure 142/62, pulse 112, temperature 97.6 F (36.4 C), temperature source Oral, resp. rate 20, height 5' 11.75" (1.822 m), weight 202 lb 4.8 oz (91.763 kg), SpO2 100.00%.  Physical Exam  Nursing note and vitals reviewed. Constitutional: He is oriented to person, place, and time and well-developed, well-nourished, and in no distress. No distress.  HENT:  Head: Normocephalic and atraumatic.  Mouth/Throat: Oropharynx is clear and moist.  No oropharyngeal exudate.  Eyes: Conjunctivae are normal. Pupils are equal, round, and reactive to light. No scleral icterus.  Neck: Normal range of motion. Neck supple. No JVD present. No tracheal deviation present. No thyromegaly present.  Complaint of discomfort with palpation to right side of his neck; and radiating to the top of his right shoulder.  No palpable masses noted on exam. Patient was observed with full range of motion to the right upper extremity.  Cardiovascular: Normal rate, regular rhythm, normal heart sounds and intact distal pulses.  Exam reveals no friction rub.   No murmur heard. Pulmonary/Chest: Effort normal and breath sounds normal. No respiratory distress. He has no wheezes.  Abdominal: He exhibits distension and ascites. Bowel sounds are tinkling. There is no hepatosplenomegaly. There is generalized tenderness. There is no rigidity, no rebound, no guarding, no CVA tenderness, no tenderness at McBurney's point and negative Murphy's sign.  Musculoskeletal: Normal range of motion. He exhibits no edema and no tenderness.  Lymphadenopathy:    He has no cervical adenopathy.  Neurological: He is alert and oriented to person, place, and time. Gait normal.  Skin: Skin is warm and dry. No rash noted. No erythema.  Patient has large, healed lateral abdominal incision.  Psychiatric: Affect normal.   LABORATORY DATA:. CBC  Lab Results  Component Value Date   WBC 21.2* 05/16/2014   RBC 4.17* 05/16/2014   HGB 11.0* 05/16/2014   HCT 34.4* 05/16/2014   PLT 539* 05/16/2014   MCV 82.6 05/16/2014   MCH 26.4* 05/16/2014   MCHC 32.0 05/16/2014   RDW 18.6* 05/16/2014   LYMPHSABS 1.3 05/16/2014   MONOABS 1.2* 05/16/2014   EOSABS 0.2 05/16/2014   BASOSABS 0.2* 05/16/2014     CMET  Lab Results  Component Value Date   NA 134* 05/16/2014   K 5.6* 05/16/2014   CL 100 12/22/2013   CO2 19* 05/16/2014   GLUCOSE 112 05/16/2014   BUN 39.3* 05/16/2014   CREATININE 1.7* 05/16/2014   CALCIUM 8.5  05/16/2014   PROT 8.0 05/16/2014   ALBUMIN 2.8* 05/16/2014   AST 81* 05/16/2014   ALT 18 05/16/2014   ALKPHOS 393* 05/16/2014   BILITOT 0.49 05/16/2014   GFRNONAA 89* 12/22/2013   GFRAA >90 12/22/2013   RADIOGRAPHIC STUDIES: Result    CLINICAL DATA: History colon cancer. Abdominal pain.  EXAM:  ABDOMEN - 1 VIEW  COMPARISON: 12/20/2013.  FINDINGS:  Surgical clips are noted throughout the abdomen and pelvis.  Increased density is noted throughout the abdomen. These findings  are consistent with ascites. There is no bowel distention. No free  air is identified. No acute bony abnormalities identified.  IMPRESSION:  1. Findings consistent with ascites. This can be confirmed with  ultrasound.  2. Surgical clips noted throughout the abdomen and pelvis. No  evidence of bowel distention or free air.  Electronically Signed  By: Marcello Moores Register  On: 05/16/2014 12:44   ASSESSMENT/PLAN:    Colon cancer  Assessment & Plan Patient underwent a small bowel resection in February 2015; and completed radiation therapy 03/01/2014.  Do to acute onset abdominal discomfort and distention-plain film abdominal x-ray was ordered today which revealed no bowel obstruction.  It did show ascites however.  Patient will be ordered a diagnostic/therapeutic paracentesis tomorrow.  Will send paracentesis fluid for cytology.  Will also order a CT with contrast of the chest/abdomen/pelvis as soon as possible for further evaluation as well.  Patient's acute renal insufficiency will have to be addressed prior to obtaining CT.  Patient has plans to return first thing in the morning for repeat labs; and to make final arrangements regarding paracentesis and restaging scans.  CEA is pending result.   Retroperitoneal liposarcoma  Assessment & Plan Please see previous note regarding colon cancer treatment and plans for restaging scans/paracentesis.   Diarrhea  Assessment & Plan Patient has been taking Lomotil for treatment  of his diarrhea.  Advised patient to hold all Lomotil at this time since patient was given Kayexalate this evening to decrease his potassium overnight.   Anorexia  Assessment & Plan Patient has had minimal to no appetite for the past to 3 days.  Patient was encouraged to push  fluids is much as possible while he is in the midst of these scans.   Nausea alone  Assessment & Plan Patient was given Zofran while at the [facility] this afternoon.  Patient states that he already  has some Zofran at home to use on an as-needed basis for nausea as well.   Neck pain  Assessment & Plan Patient has 3 specific areas to his right neck and upper shoulder region that bothering her father in him.  Patient is observed with full range of motion.  Will obtain CT of the chest/abdomen/pelvis to further evaluate.  May very well need an orthopedic referral in the future.     Renal insufficiency  Assessment & Plan Creatinine today was initially 2.0; but did decrease to 1.7 after IV fluid rehydration.  Most likely, this is secondary to fairly severe dehydration.  Patient was instructed to hold his Lotrel up for the time being.  Hopefully, this will prevent patient from becoming even more dehydrated.  Will recheck labs first thing in the morning.   Hyperkalemia  Assessment & Plan Patient's potassium remained elevated today between 5.4 and 5.6.  Patient denies taking any potassium tablets at home.  Was initially thought that hyperkalemia was associated with dehydration; but potassium remained elevated despite IV fluids.  Patient was prescribed Kayexalate 15 g to take by 2 different times this evening to reduce his potassium.  Will recheck labs first thing in the morning.   Hyperphosphatemia  Assessment & Plan Alkaline phosphatase has increased since last lab check.  This may be an indication of disease progression.  Will further evaluate with restaging scans tomorrow.   Transaminitis  Assessment &  Plan AST was mildly elevated will monitor closely.   Hypoalbuminemia  Assessment & Plan Albumin is decreased.  Patient was encouraged to push protein in his diet is much as possible in the future.   Leukocytosis, unspecified  Assessment & Plan White count was elevated to 21 today.  Patient denies any recent fevers or chills.   Patient stated understanding of all instructions; and was in agreement with this plan of care. The patient knows to call the clinic with any problems, questions or concerns.    This was a shared visit with Dr. Benay Spice today.  Total time spent with patient was 70  minutes;  with greater than 75  percent of that time spent in face to face counseling regarding patient's symptoms, review of his lab results and abdominal x-ray results, coordination of scans for tomorrow, and frequent monitoring while in the infusion area,  and coordination of care and follow up.  Disclaimer: This note was dictated with voice recognition software. Similar sounding words can inadvertently be transcribed and may not be corrected upon review.   Drue Second, NP 05/16/2014   This was a shared visit with Drue Second.  Mr. Mena was interviewed and examined. He has developed acute renal failure versus dehydration. He will receive intravenous fluids and we will refer him for imaging of the abdomen. I am concerned he has developed recurrent tumor in the abdomen. He will return for a repeat chemistry panel and imaging studies on 05/17/2014. He will discontinue the benazepril.  Julieanne Manson, M.D.

## 2014-05-16 NOTE — Telephone Encounter (Signed)
Pt confirmed labs/ov per 09/21 POF, gave pt AVS...Marland KitchenMarland KitchenKJ

## 2014-05-16 NOTE — Assessment & Plan Note (Signed)
AST was mildly elevated will monitor closely.

## 2014-05-16 NOTE — Telephone Encounter (Signed)
Spk w/pt advised to come in earlier for labs per NP/CB, pt confirmed...Marland KitchenMarland KitchenKJ

## 2014-05-16 NOTE — Patient Instructions (Signed)

## 2014-05-16 NOTE — Assessment & Plan Note (Signed)
Patient has had minimal to no appetite for the past to 3 days.  Patient was encouraged to push fluids is much as possible while he is in the midst of these scans.

## 2014-05-16 NOTE — Assessment & Plan Note (Signed)
Albumin is decreased.  Patient was encouraged to push protein in his diet is much as possible in the future.

## 2014-05-16 NOTE — Assessment & Plan Note (Addendum)
Creatinine today was initially 2.0; but did decrease to 1.7 after IV fluid rehydration.  Most likely, this is secondary to fairly severe dehydration.  Patient was instructed to hold his Lotrel up for the time being.  Hopefully, this will prevent patient from becoming even more dehydrated.  Will recheck labs first thing in the morning.

## 2014-05-16 NOTE — Assessment & Plan Note (Signed)
White count was elevated to 21 today.  Patient denies any recent fevers or chills.

## 2014-05-16 NOTE — Progress Notes (Signed)
1323-Morphine 2 mg IV given as ordered.  Pt states that pain is a "tight pain to abdomen,  level "6 of 10".  1400-Pt states pain now at a level "2".

## 2014-05-16 NOTE — Telephone Encounter (Signed)
Pt called reports "I've had a distended abdomin for 3 days now and need to see Dr.Sherrill NOW"  Per Dr. Benay Spice; returned call to patient to offer to be seen in [facility] for distended abdomin and he confirmed he would come now.

## 2014-05-16 NOTE — Assessment & Plan Note (Signed)
Please see previous note regarding colon cancer treatment and plans for restaging scans/paracentesis.

## 2014-05-16 NOTE — Assessment & Plan Note (Signed)
Patient's potassium remained elevated today between 5.4 and 5.6.  Patient denies taking any potassium tablets at home.  Was initially thought that hyperkalemia was associated with dehydration; but potassium remained elevated despite IV fluids.  Patient was prescribed Kayexalate 15 g to take by 2 different times this evening to reduce his potassium.  Will recheck labs first thing in the morning.

## 2014-05-16 NOTE — Assessment & Plan Note (Signed)
Patient was given Zofran while at the [facility] this afternoon.  Patient states that he already  has some Zofran at home to use on an as-needed basis for nausea as well.

## 2014-05-16 NOTE — Assessment & Plan Note (Signed)
Patient has been taking Lomotil for treatment of his diarrhea.  Advised patient to hold all Lomotil at this time since patient was given Kayexalate this evening to decrease his potassium overnight.

## 2014-05-17 ENCOUNTER — Ambulatory Visit (HOSPITAL_COMMUNITY)
Admission: RE | Admit: 2014-05-17 | Discharge: 2014-05-17 | Disposition: A | Discharge status: Home / Self Care | Payer: Medicare Other | Source: Ambulatory Visit | Attending: Nurse Practitioner | Admitting: Nurse Practitioner

## 2014-05-17 ENCOUNTER — Other Ambulatory Visit (HOSPITAL_BASED_OUTPATIENT_CLINIC_OR_DEPARTMENT_OTHER): Payer: Medicare Other

## 2014-05-17 ENCOUNTER — Ambulatory Visit (HOSPITAL_BASED_OUTPATIENT_CLINIC_OR_DEPARTMENT_OTHER): Payer: Medicare Other | Admitting: Nurse Practitioner

## 2014-05-17 ENCOUNTER — Telehealth: Payer: Self-pay | Admitting: *Deleted

## 2014-05-17 DIAGNOSIS — C189 Malignant neoplasm of colon, unspecified: Secondary | ICD-10-CM | POA: Insufficient documentation

## 2014-05-17 DIAGNOSIS — R109 Unspecified abdominal pain: Secondary | ICD-10-CM

## 2014-05-17 DIAGNOSIS — R188 Other ascites: Secondary | ICD-10-CM | POA: Diagnosis present

## 2014-05-17 DIAGNOSIS — N289 Disorder of kidney and ureter, unspecified: Secondary | ICD-10-CM

## 2014-05-17 DIAGNOSIS — C480 Malignant neoplasm of retroperitoneum: Secondary | ICD-10-CM

## 2014-05-17 DIAGNOSIS — E875 Hyperkalemia: Secondary | ICD-10-CM

## 2014-05-17 LAB — BASIC METABOLIC PANEL (CC13)
ANION GAP: 12 meq/L — AB (ref 3–11)
BUN: 39.9 mg/dL — ABNORMAL HIGH (ref 7.0–26.0)
CALCIUM: 8.5 mg/dL (ref 8.4–10.4)
CO2: 19 meq/L — AB (ref 22–29)
CREATININE: 2.1 mg/dL — AB (ref 0.7–1.3)
Chloride: 102 mEq/L (ref 98–109)
Glucose: 166 mg/dl — ABNORMAL HIGH (ref 70–140)
Potassium: 5.4 mEq/L — ABNORMAL HIGH (ref 3.5–5.1)
SODIUM: 133 meq/L — AB (ref 136–145)

## 2014-05-17 LAB — CEA: CEA: 30.7 ng/mL — AB (ref 0.0–5.0)

## 2014-05-17 NOTE — Telephone Encounter (Signed)
Patient scheduled to see Dr. Amedeo Kinsman tomorrow at 9:45am at [facility] at [facility]. Per MD request, copy of 05/16/14 office visit with Selena Lesser, NP, labs, and results from renal ultrasound, US paracentesis and xray faxed to [phone] attn: Kennyth Lose who is Dr. Corinne Ports nurse.

## 2014-05-17 NOTE — Procedures (Signed)
US guided diagnostic/therapeutic paracentesis performed yielding 2.4 liters yellow fluid. Only the above amount of fluid was aspirated at this time secondary to renal insufficiency. A portion of the fluid was sent to the lab for cytology. No immediate complications.

## 2014-05-18 ENCOUNTER — Ambulatory Visit (HOSPITAL_COMMUNITY): Payer: Medicare Other

## 2014-05-18 ENCOUNTER — Telehealth: Payer: Self-pay | Admitting: Nurse Practitioner

## 2014-05-18 ENCOUNTER — Encounter: Payer: Self-pay | Admitting: Nurse Practitioner

## 2014-05-18 NOTE — Assessment & Plan Note (Signed)
See previous note regarding colon cancer.

## 2014-05-18 NOTE — Telephone Encounter (Signed)
, °

## 2014-05-18 NOTE — Assessment & Plan Note (Addendum)
Patient continues with elevated creatinine despite aggressive fluid rehydration yesterday afternoon.  Creatinine has increased from 1.7 back up to 2.1.  Patient to followup with primary care provider in the morning for further evaluation and management.  Patient was advised to continue to hold Lotrel.

## 2014-05-18 NOTE — Assessment & Plan Note (Signed)
Despite patient receiving 2 doses of Kayexalate overnight-the potassium has remained elevated at 5.4.  Patient will followup with primary care provider first thing in the morning for further evaluation and management of hyperkalemia.

## 2014-05-18 NOTE — Telephone Encounter (Signed)
I had called patient to review scan results; but there was no answer.  Patient immediately copy back to review scan results from just yesterday afternoon.  It does appear the patient has progression of his disease.  Informed patient that there was possibly a need for a biopsy to confirm whether this was actually progression of colon cancer or a liposarcoma.  Confirm with patient that all recent records and reports have been faxed to Dr. Corinne Ports office for his appointment later this morning.  Patient was advised to continue to hold his Lotrel until his permit her provider did instruct him otherwise.  Also advised patient that we will be calling him to set up an appointment with Dr. Benay Spice and myself for tomorrow morning to further review all scan results.  Patient stated he understood all instructions; and was in agreement this plan of care.  The

## 2014-05-18 NOTE — Progress Notes (Signed)
[place]   Chief Complaint  Patient presents with  . Follow-up    HPI: Dean Owens 68 y.o. male diagnosed with colon cancer and  peritoneal liposarcoma.  Patient presented to the [facility] just yesterday with a 3 day complaint of abdominal pain and distention.  Was found per plain film x-ray the patient was suffering with some ascites.  Patient has plans to obtain a therapeutic/diagnostic paracentesis this morning; and will obtain a CT with contrast of the chest/abdomen/pelvis this afternoon for further evaluation.  Also, patient will followup with his primary care provider first thing tomorrow morning for further management of both renal insufficiency and hyperkalemia.  Patient continues to hold his Lotrel to her further instructions per history care provider.  HPI  CURRENT THERAPY: No active treatment plan for patient.   ROS  Past Medical History  Diagnosis Date  . Hypertension   . Type 2 diabetes mellitus   . Hypothyroidism   . History of colon cancer NO RECURRENCE    S/P COLECTOMY X3  LAST ONE 1998--  Dean Owens  . ED (erectile dysfunction) of organic origin   . Right knee DJD   . Eczema   . OA (osteoarthritis)     LEFT KNEE  . Wears glasses   . History of iron deficiency anemia   . History of squamous cell carcinoma excision   . Accelerated hypertension 10/21/2013  . Type II diabetes mellitus 10/21/2013  . Unspecified hypothyroidism 10/21/2013  . 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 Lynch syndrome   . History of nonmelanoma skin cancer   . Anemia   . Recurrent prostate carcinoma FOLLOWED BY DR Gaynelle Arabian    DX 2007  S/P RADIOACTIVE SEED IMPLANTS  . 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 Ileus     s/p op 3 days  . History of blood transfusion 09/2013    PRBC  . S/P radiation therapy 01/19/14-03/01/14    retroperitonela sarcoma/abdomen    Past Surgical History  Procedure Laterality Date  . Givens capsule study N/A  07/05/2013    Procedure: GIVENS CAPSULE STUDY;  Surgeon: [name], MD;  Location: [facility];  Service: Endoscopy;  Laterality: N/A;  . Radioactive prostate seed implants  OCT 2007  . Knee arthroscopy Bilateral 2006 &  2007  . Tonsillectomy  AS CHILD  . Appendectomy  AGE 50  . Cryoablation N/A 10/01/2013    Procedure: CRYO ABLATION PROSTATE;  Surgeon: [name], MD;  Location: [facility];  Service: Urology;  Laterality: N/A;  . Prostate surgery    . Right colectomy  09/07/1995  . Sigmoidectomy  11/13/95  . Total colectomy  12/29/1997    ileorectal anastomosis  . Laparotomy N/A 10/14/2013    Procedure: EXPLORATORY LAPAROTOMY;  Surgeon: [name], MD;  Location: [facility];  Service: General;  Laterality: N/A;  . Bowel resection N/A 10/14/2013    Procedure: SMALL BOWEL RESECTION;  Surgeon: [name], MD;  Location: [place] [facility];  Service: General;  Laterality: N/A;  . Lysis of adhesion N/A 10/14/2013    Procedure: LYSIS OF ADHESION;  Surgeon: [name], MD;  Location: [place];  Service: General;  Laterality: N/A;  . 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 Cystoscopy with retrograde pyelogram, ureteroscopy and  stent placement Right 12/14/2013    Procedure: CYSTOSCOPY WITH RIGHT RETROGRADE PYELOGRAM / INSERTION RIGHT URETERAL STENT;  Surgeon: [name], MD;  Location: [facility];  Service: Urology;  Laterality: Right;    has Colon cancer; Prostate cancer; Protein-calorie malnutrition, severe; Small bowel tumor; Accelerated hypertension; Type II diabetes mellitus; Unspecified hypothyroidism; Small bowel carcinoma; Lynch syndrome; Retroperitoneal liposarcoma; Sarcoma of retroperitoneum; Diarrhea; Anorexia; Nausea alone; Neck pain; Renal insufficiency; Hyperkalemia; Hyperphosphatemia; Transaminitis; Hypoalbuminemia; and  Leukocytosis, unspecified on his problem list.     has No Known Allergies.    Medication List       This list is accurate as of: 05/17/14 11:59 PM.  Always use your most recent med list.               acetaminophen 325 MG tablet  Commonly known as:  TYLENOL  Take 2 tablets (650 mg total) by mouth every 6 (six) hours as needed for mild pain, moderate pain, fever or headache.     amLODipine-benazepril 5-10 MG per capsule  Commonly known as:  LOTREL  Take 1 capsule by mouth every morning.     diphenoxylate-atropine 2.5-0.025 MG per tablet  Commonly known as:  LOMOTIL  Take 1 tablet by mouth 4 (four) times daily as needed for diarrhea or loose stools.     ferrous sulfate 325 (65 FE) MG tablet  Take 325 mg by mouth daily.     glipiZIDE 5 MG tablet  Commonly known as:  GLUCOTROL  Take 5 mg by mouth daily before breakfast.     levothyroxine 50 MCG tablet  Commonly known as:  SYNTHROID, LEVOTHROID  Take 50 mcg by mouth daily before breakfast.     multivitamin with minerals Tabs tablet  Take 1 tablet by mouth daily.     oxyCODONE-acetaminophen 5-325 MG per tablet  Commonly known as:  PERCOCET/ROXICET  Take 1-2 tablets by mouth every 6 (six) hours as needed for severe pain.     sodium polystyrene 15 GM/60ML suspension  Commonly known as:  KAYEXALATE  Take 60 mLs (15 g total) by mouth every 6 (six) hours.         PHYSICAL EXAMINATION  Weight 0 lb (0 kg).  Physical Exam  Nursing note reviewed. Constitutional: He is oriented to person, place, and time and well-developed, well-nourished, and in no distress. No distress.  Neurological: He is alert and oriented to person, place, and time. Gait normal. GCS score is 15.  Skin: Skin is warm and dry.  Psychiatric: Mood, affect and judgment normal.    LABORATORY DATA:. CBC  Lab Results  Component Value Date   WBC 21.2* 05/16/2014   RBC 4.17* 05/16/2014   HGB 11.0* 05/16/2014   HCT 34.4* 05/16/2014   PLT 539* 05/16/2014    MCV 82.6 05/16/2014   MCH 26.4* 05/16/2014   MCHC 32.0 05/16/2014   RDW 18.6* 05/16/2014   LYMPHSABS 1.3 05/16/2014   MONOABS 1.2* 05/16/2014   EOSABS 0.2 05/16/2014   BASOSABS 0.2* 05/16/2014     CMET  Lab Results  Component Value Date   NA 133* 05/17/2014   K 5.4* 05/17/2014   CL 100 12/22/2013   CO2 19* 05/17/2014   GLUCOSE 166* 05/17/2014   BUN 39.9* 05/17/2014   CREATININE 2.1* 05/17/2014   CALCIUM 8.5 05/17/2014   PROT 8.0 05/16/2014   ALBUMIN 2.8* 05/16/2014   AST 81* 05/16/2014   ALT 18 05/16/2014   ALKPHOS 393* 05/16/2014   BILITOT 0.49 05/16/2014   GFRNONAA  89* 12/22/2013   GFRAA >90 12/22/2013   ASSESSMENT/PLAN:    Colon cancer  Assessment & Plan Patient to obtain a therapeutic/diagnostic paracentesis this morning; and wil obtain a CT with contrast of the chest/abdomen/pelvis later this afternoon.  Patient's permit her provider is Dr. Dorian Heckle.  Patient has been given an appointment to see one of Dr. Kathline Magic partners for followup and management of patient's new onset renal insufficiency and hyperkalemia tomorrow morning at 9:45 AM.  Advised patient that would fax a copy of all recent data and reports to Dr. Corinne Ports office.  Also advised patient that I would call him first thing in the morning to review his scan results.     Retroperitoneal liposarcoma  Assessment & Plan See previous note regarding colon cancer.     Renal insufficiency  Assessment & Plan Patient continues with elevated creatinine despite aggressive fluid rehydration yesterday afternoon.  Creatinine has increased from 1.7 back up to 2.1.  Patient to followup with primary care provider in the morning for further evaluation and management.  Patient was advised to continue to hold Lotrel.   Hyperkalemia  Assessment & Plan Despite patient receiving 2 doses of Kayexalate overnight-the potassium has remained elevated at 5.4.  Patient will followup with primary care provider first thing in the morning for  further evaluation and management of hyperkalemia.   Patient stated understanding of all instructions; and was in agreement with this plan of care. The patient knows to call the clinic with any problems, questions or concerns.   Review/collaboration with Dr. Benay Spice regarding all aspects of patient's visit today.   Total time spent with patient was 40 minutes;  with greater than 90 percent of that time spent in face to face counseling regarding his symptoms, review of his lab results, review of his paracentesis results, coordination of appointment with her care practice, and coordination of care and follow up.  Disclaimer: This note was dictated with voice recognition software. Similar sounding words can inadvertently be transcribed and may not be corrected upon review.   Drue Second, NP 05/18/2014

## 2014-05-18 NOTE — Assessment & Plan Note (Signed)
Patient to obtain a therapeutic/diagnostic paracentesis this morning; and wil obtain a CT with contrast of the chest/abdomen/pelvis later this afternoon.  Patient's permit her provider is Dr. Dorian Heckle.  Patient has been given an appointment to see one of Dr. Kathline Magic partners for followup and management of patient's new onset renal insufficiency and hyperkalemia tomorrow morning at 9:45 AM.  Advised patient that would fax a copy of all recent data and reports to Dr. Corinne Ports office.  Also advised patient that I would call him first thing in the morning to review his scan results.

## 2014-05-18 NOTE — Telephone Encounter (Signed)
Sent copy of patient's CT scan from 05/17/14 to Dr. Corinne Ports office.

## 2014-05-19 ENCOUNTER — Ambulatory Visit (HOSPITAL_BASED_OUTPATIENT_CLINIC_OR_DEPARTMENT_OTHER): Payer: Medicare Other | Admitting: Nurse Practitioner

## 2014-05-19 VITALS — BP 144/67 | HR 114 | Temp 98.2°F | Resp 20 | Ht 71.0 in | Wt 203.4 lb

## 2014-05-19 DIAGNOSIS — E875 Hyperkalemia: Secondary | ICD-10-CM

## 2014-05-19 DIAGNOSIS — C189 Malignant neoplasm of colon, unspecified: Secondary | ICD-10-CM

## 2014-05-19 DIAGNOSIS — N289 Disorder of kidney and ureter, unspecified: Secondary | ICD-10-CM

## 2014-05-19 DIAGNOSIS — R109 Unspecified abdominal pain: Secondary | ICD-10-CM

## 2014-05-19 DIAGNOSIS — C179 Malignant neoplasm of small intestine, unspecified: Secondary | ICD-10-CM

## 2014-05-20 ENCOUNTER — Telehealth: Payer: Self-pay | Admitting: Nurse Practitioner

## 2014-05-20 ENCOUNTER — Ambulatory Visit (HOSPITAL_COMMUNITY)
Admission: RE | Admit: 2014-05-20 | Discharge: 2014-05-20 | Disposition: A | Discharge status: Home / Self Care | Payer: Medicare Other | Source: Ambulatory Visit | Attending: Oncology | Admitting: Oncology

## 2014-05-20 DIAGNOSIS — R188 Other ascites: Secondary | ICD-10-CM | POA: Diagnosis not present

## 2014-05-20 DIAGNOSIS — N289 Disorder of kidney and ureter, unspecified: Secondary | ICD-10-CM | POA: Insufficient documentation

## 2014-05-20 DIAGNOSIS — C179 Malignant neoplasm of small intestine, unspecified: Secondary | ICD-10-CM | POA: Insufficient documentation

## 2014-05-20 NOTE — Procedures (Addendum)
US guided diagnostic/ therapeutic paracentesis performed yielding 4 liters (maximum ordered) yellow fluid. A portion of the fluid was sent to the lab for cytology. No immediate complications.

## 2014-05-20 NOTE — Telephone Encounter (Signed)
, °

## 2014-05-24 ENCOUNTER — Ambulatory Visit (HOSPITAL_BASED_OUTPATIENT_CLINIC_OR_DEPARTMENT_OTHER): Payer: Medicare Other | Admitting: *Deleted

## 2014-05-24 ENCOUNTER — Ambulatory Visit (HOSPITAL_BASED_OUTPATIENT_CLINIC_OR_DEPARTMENT_OTHER): Payer: Medicare Other | Admitting: Nurse Practitioner

## 2014-05-24 ENCOUNTER — Telehealth: Payer: Self-pay | Admitting: *Deleted

## 2014-05-24 ENCOUNTER — Telehealth: Payer: Self-pay | Admitting: Oncology

## 2014-05-24 VITALS — BP 125/83 | HR 109 | Temp 98.1°F | Resp 18 | Ht 71.0 in | Wt 196.7 lb

## 2014-05-24 DIAGNOSIS — E875 Hyperkalemia: Secondary | ICD-10-CM

## 2014-05-24 DIAGNOSIS — N289 Disorder of kidney and ureter, unspecified: Secondary | ICD-10-CM

## 2014-05-24 DIAGNOSIS — R188 Other ascites: Secondary | ICD-10-CM

## 2014-05-24 DIAGNOSIS — D649 Anemia, unspecified: Secondary | ICD-10-CM

## 2014-05-24 DIAGNOSIS — C480 Malignant neoplasm of retroperitoneum: Secondary | ICD-10-CM

## 2014-05-24 DIAGNOSIS — C189 Malignant neoplasm of colon, unspecified: Secondary | ICD-10-CM

## 2014-05-24 DIAGNOSIS — C179 Malignant neoplasm of small intestine, unspecified: Secondary | ICD-10-CM

## 2014-05-24 DIAGNOSIS — R109 Unspecified abdominal pain: Secondary | ICD-10-CM

## 2014-05-24 LAB — CBC WITH DIFFERENTIAL/PLATELET
BASO%: 0.4 % (ref 0.0–2.0)
BASOS ABS: 0.1 10*3/uL (ref 0.0–0.1)
EOS%: 0.3 % (ref 0.0–7.0)
Eosinophils Absolute: 0.1 10*3/uL (ref 0.0–0.5)
HEMATOCRIT: 33.4 % — AB (ref 38.4–49.9)
HGB: 11 g/dL — ABNORMAL LOW (ref 13.0–17.1)
LYMPH%: 5.5 % — AB (ref 14.0–49.0)
MCH: 26.4 pg — AB (ref 27.2–33.4)
MCHC: 32.9 g/dL (ref 32.0–36.0)
MCV: 80.1 fL (ref 79.3–98.0)
MONO#: 1.2 10*3/uL — ABNORMAL HIGH (ref 0.1–0.9)
MONO%: 5.7 % (ref 0.0–14.0)
NEUT%: 88.1 % — AB (ref 39.0–75.0)
NEUTROS ABS: 18.2 10*3/uL — AB (ref 1.5–6.5)
Platelets: 400 10*3/uL (ref 140–400)
RBC: 4.17 10*6/uL — ABNORMAL LOW (ref 4.20–5.82)
RDW: 17.3 % — AB (ref 11.0–14.6)
WBC: 20.6 10*3/uL — AB (ref 4.0–10.3)
lymph#: 1.1 10*3/uL (ref 0.9–3.3)

## 2014-05-24 LAB — COMPREHENSIVE METABOLIC PANEL (CC13)
ALK PHOS: 411 U/L — AB (ref 40–150)
ALT: 16 U/L (ref 0–55)
AST: 95 U/L — AB (ref 5–34)
Albumin: 2.2 g/dL — ABNORMAL LOW (ref 3.5–5.0)
Anion Gap: 5 mEq/L (ref 3–11)
BUN: 33 mg/dL — AB (ref 7.0–26.0)
CO2: 24 mEq/L (ref 22–29)
CREATININE: 2 mg/dL — AB (ref 0.7–1.3)
Calcium: 8.9 mg/dL (ref 8.4–10.4)
Chloride: 97 mEq/L — ABNORMAL LOW (ref 98–109)
Glucose: 157 mg/dl — ABNORMAL HIGH (ref 70–140)
Potassium: 6.4 mEq/L (ref 3.5–5.1)
Sodium: 125 mEq/L — ABNORMAL LOW (ref 136–145)
Total Bilirubin: 0.44 mg/dL (ref 0.20–1.20)
Total Protein: 6.8 g/dL (ref 6.4–8.3)

## 2014-05-24 LAB — CEA: CEA: 46.5 ng/mL — AB (ref 0.0–5.0)

## 2014-05-24 MED ORDER — SODIUM POLYSTYRENE SULFONATE 15 GM/60ML PO SUSP: 60.0000 g | Freq: Every day | ORAL | Status: DC

## 2014-05-24 MED ORDER — OXYCODONE-ACETAMINOPHEN 5-325 MG PO TABS: 1.0000 | ORAL_TABLET | Freq: Four times a day (QID) | ORAL | Status: DC | PRN

## 2014-05-24 NOTE — Telephone Encounter (Signed)
s/w pt re appt for 10/1. message sent to CB and BS re bx and [place] per 9/29 pof - no orders.

## 2014-05-24 NOTE — Telephone Encounter (Signed)
Pt here to see Retta Mac, NP.  Pt informed NP that he had seen his primary Dr. Amedeo Kinsman last week , and was still waiting for a referral call to renal specialist.   Called Dr. Corinne Ports office and spoke with a CMA.  Was informed that the referral has not been made yet.   Message relayed to [place], NP. Dr. Corinne Ports  Phone    [phone].

## 2014-05-25 ENCOUNTER — Telehealth: Payer: Self-pay | Admitting: *Deleted

## 2014-05-25 ENCOUNTER — Other Ambulatory Visit: Payer: Self-pay | Admitting: Radiology

## 2014-05-25 ENCOUNTER — Encounter: Payer: Self-pay | Admitting: Nurse Practitioner

## 2014-05-25 ENCOUNTER — Telehealth: Payer: Self-pay | Admitting: Nurse Practitioner

## 2014-05-25 ENCOUNTER — Telehealth: Payer: Self-pay | Admitting: Oncology

## 2014-05-25 ENCOUNTER — Ambulatory Visit (HOSPITAL_COMMUNITY)
Admission: RE | Admit: 2014-05-25 | Discharge: 2014-05-25 | Disposition: A | Discharge status: Home / Self Care | Payer: Medicare Other | Source: Ambulatory Visit | Attending: Nurse Practitioner | Admitting: Nurse Practitioner

## 2014-05-25 DIAGNOSIS — R109 Unspecified abdominal pain: Secondary | ICD-10-CM | POA: Insufficient documentation

## 2014-05-25 DIAGNOSIS — E875 Hyperkalemia: Secondary | ICD-10-CM | POA: Insufficient documentation

## 2014-05-25 DIAGNOSIS — C189 Malignant neoplasm of colon, unspecified: Secondary | ICD-10-CM | POA: Diagnosis not present

## 2014-05-25 DIAGNOSIS — Z923 Personal history of irradiation: Secondary | ICD-10-CM | POA: Insufficient documentation

## 2014-05-25 DIAGNOSIS — R188 Other ascites: Secondary | ICD-10-CM | POA: Diagnosis present

## 2014-05-25 DIAGNOSIS — E119 Type 2 diabetes mellitus without complications: Secondary | ICD-10-CM | POA: Insufficient documentation

## 2014-05-25 DIAGNOSIS — N289 Disorder of kidney and ureter, unspecified: Secondary | ICD-10-CM | POA: Insufficient documentation

## 2014-05-25 MED ORDER — FUROSEMIDE 20 MG PO TABS: 20.0000 mg | ORAL_TABLET | Freq: Every day | ORAL | Status: DC

## 2014-05-25 NOTE — Assessment & Plan Note (Signed)
Patient status post small bowel resection and debris 2015.  Patient completed radiation therapy in July 2015.  Patient has had 2 previous paracentesis; with cytology negative both times.  Patient will obtain a repeat paracentesis tomorrow 05/25/2014; and we will order a repeat fluid cytology at that time.  Will also order a CT-guided biopsy of the mesenteric mass to be completed as soon as possible.  Patient will return to the cancer center Friday afternoon 05/27/2014 for followup.  Hopefully, by that time we will have both peritoneal cytology results and/or biopsy results so we can initiate a plan of treatment.

## 2014-05-25 NOTE — Assessment & Plan Note (Signed)
Patient is complaining of recurrent abdominal discomfort and distention.  Will order a repeat paracentesis ASAP.  Pending scheduling of CT-guided biopsy of mesenteric mass is well.

## 2014-05-25 NOTE — Telephone Encounter (Signed)
Message from Sydell Axon with [facility] requesting return call. Returned call, no answer. Left message requesting she leave a detailed message or speak with triage nurse when calling.

## 2014-05-25 NOTE — Assessment & Plan Note (Signed)
Patient states that he does feel much better since obtaining a paracentesis today.  Awaiting peritoneal fluid cytology results.  Patient will quite possibly need repeat paracentesis if ascites returns.

## 2014-05-25 NOTE — Assessment & Plan Note (Signed)
Creatinine has improved from 2.1 down to 2.0.  Confirm that there has not been a renal consult made per primary care provider as of yet.  Dr. Benay Spice did speak with Dr. Posey Pronto nephrologist; and he suggested patient initiate Kayexalate and Lasix for the time being.  Will order a renal consult ASAP.

## 2014-05-25 NOTE — Telephone Encounter (Signed)
lvm for pt regarding to cx appt and appts on 10.2.Marland KitchenMarland KitchenMarland Kitcheni made it clear that radiology appts are still on sched

## 2014-05-25 NOTE — Assessment & Plan Note (Signed)
A potassium today was 6.4.  A confirmed the patient is no longer taking Lotrel.  Per Dr. Serita Grit advice- patient was instructed to initiate Kayexalate 60 g per day; and Lasix 40 mg per day.  We will recheck labs and 48 hours.

## 2014-05-25 NOTE — Telephone Encounter (Signed)
, °

## 2014-05-25 NOTE — Assessment & Plan Note (Signed)
Patient was given Kayexalate to take last night.  However, potassium continues elevated.  Confirmed the patient continues to hold his Lotrel.

## 2014-05-25 NOTE — Assessment & Plan Note (Signed)
Patient continues with an elevated creatinine.  Patient met with permit her provider Dr. Amedeo Kinsman earlier this morning.  Permit her provider will obtain a renal consult for patient.

## 2014-05-25 NOTE — Progress Notes (Signed)
[place]   Chief Complaint  Patient presents with  . Follow-up    HPI: Dean Owens 68 y.o. 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  Patient is status post small bowel resection in February 2015.  Patient completed radiation therapy in July 2015.  Patient returned briefly to the [facility] today for followup.  He went to see primary care provider Dr. Amedeo Kinsman earlier today; and also underwent a paracentesis for ascites.  Patient states he feels much better since his abdominal fluid has been removed.  Patient states that his primary care provider will initiate a renal consult for him for further investigation of new onset renal insufficiency and hyperkalemia.  He also confirm that he has taken the Kayexalate as previously directed.   HPI  CURRENT THERAPY: No active treatment plan for patient.   ROS  Past Medical History  Diagnosis Date  . Hypertension   . Type 2 diabetes mellitus   . Hypothyroidism   . History of colon cancer NO RECURRENCE    S/P COLECTOMY X3  LAST ONE 1998--  [place]  . ED (erectile dysfunction) of organic origin   . Right knee DJD   . Eczema   . OA (osteoarthritis)     LEFT KNEE  . Wears glasses   . History of iron deficiency anemia   . History of squamous cell carcinoma excision   . Accelerated hypertension 10/21/2013  . Type II diabetes mellitus 10/21/2013  . Unspecified hypothyroidism 10/21/2013  . 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 Lynch syndrome   . History of nonmelanoma skin cancer   . Anemia   . Recurrent prostate carcinoma FOLLOWED BY DR Gaynelle Arabian    DX 2007  S/P RADIOACTIVE SEED IMPLANTS  . 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 Ileus     s/p op 3 days  . History of blood transfusion 09/2013    PRBC  . S/P radiation therapy 01/19/14-03/01/14    retroperitonela sarcoma/abdomen    Past Surgical History  Procedure Laterality Date  . Givens capsule study N/A 07/05/2013    Procedure: GIVENS CAPSULE STUDY;  Surgeon: [name], MD;  Location: [facility];  Service: Endoscopy;  Laterality: N/A;  . Radioactive prostate seed implants  OCT 2007  . Knee arthroscopy Bilateral 2006 &  2007  . Tonsillectomy  AS CHILD  . Appendectomy  AGE 89  . Cryoablation N/A 10/01/2013    Procedure: CRYO ABLATION PROSTATE;  Surgeon: [name], MD;  Location: [facility];  Service: Urology;  Laterality: N/A;  . Prostate surgery    . Right colectomy  09/07/1995  . Sigmoidectomy  11/13/95  . Total colectomy  12/29/1997    ileorectal anastomosis  . Laparotomy N/A 10/14/2013    Procedure: EXPLORATORY LAPAROTOMY;  Surgeon: [name], MD;  Location: [facility];  Service: General;  Laterality: N/A;  . Bowel resection N/A 10/14/2013    Procedure: SMALL BOWEL RESECTION;  Surgeon: [name], MD;  Location: [facility];  Service: General;  Laterality: N/A;  . Lysis of adhesion N/A 10/14/2013    Procedure: LYSIS OF ADHESION;  Surgeon: [name], MD;  Location: [place];  Service: General;  Laterality: N/A;  . 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 Cystoscopy with retrograde  pyelogram, ureteroscopy and stent placement Right 12/14/2013    Procedure: CYSTOSCOPY WITH RIGHT RETROGRADE PYELOGRAM / INSERTION RIGHT URETERAL STENT;  Surgeon: [name], MD;  Location[facility];  Service: Urology;  Laterality: Right;    has Colon cancer; Prostate cancer; Protein-calorie malnutrition, severe; Small bowel tumor; Accelerated hypertension; Type II diabetes mellitus; Unspecified hypothyroidism; Small bowel carcinoma; Lynch syndrome; Retroperitoneal liposarcoma; Sarcoma of retroperitoneum; Diarrhea; Anorexia; Nausea alone; Neck pain; Renal insufficiency; Hyperkalemia; Hyperphosphatemia; Transaminitis; Hypoalbuminemia; Leukocytosis,  unspecified; and Abdominal pain, unspecified site on his problem list.     has No Known Allergies.    Medication List       This list is accurate as of: 05/19/14 11:59 PM.  Always use your most recent med list.               acetaminophen 325 MG tablet  Commonly known as:  TYLENOL  Take 2 tablets (650 mg total) by mouth every 6 (six) hours as needed for mild pain, moderate pain, fever or headache.     amLODipine-benazepril 5-10 MG per capsule  Commonly known as:  LOTREL  Take 1 capsule by mouth every morning.     diphenoxylate-atropine 2.5-0.025 MG per tablet  Commonly known as:  LOMOTIL  Take 1 tablet by mouth 4 (four) times daily as needed for diarrhea or loose stools.     ferrous sulfate 325 (65 FE) MG tablet  Take 325 mg by mouth daily.     glipiZIDE 5 MG tablet  Commonly known as:  GLUCOTROL  Take 5 mg by mouth daily before breakfast.     levothyroxine 50 MCG tablet  Commonly known as:  SYNTHROID, LEVOTHROID  Take 50 mcg by mouth daily before breakfast.     multivitamin with minerals Tabs tablet  Take 1 tablet by mouth daily.     oxyCODONE-acetaminophen 5-325 MG per tablet  Commonly known as:  PERCOCET/ROXICET  Take 1-2 tablets by mouth every 6 (six) hours as needed for severe pain.     sodium polystyrene 15 GM/60ML suspension  Commonly known as:  KAYEXALATE  Take 60 mLs (15 g total) by mouth every 6 (six) hours.         PHYSICAL EXAMINATION  Blood pressure 144/67, pulse 114, temperature 98.2 F (36.8 C), temperature source Oral, resp. rate 20, height 5' 11" (1.803 m), weight 203 lb 6.4 oz (92.262 kg).  Physical Exam  Nursing note reviewed. Constitutional: He is oriented to person, place, and time and well-developed, well-nourished, and in no distress. No distress.  HENT:  Head: Normocephalic.  Eyes: Pupils are equal, round, and reactive to light.  Neck: Normal range of motion.  Pulmonary/Chest: Effort normal.  Neurological: He is alert and oriented  to person, place, and time. Gait normal.  Psychiatric: Affect normal.   ASSESSMENT/PLAN:    Colon cancer  Assessment & Plan Patient is status post small bowel resection in February 2015.  Completed radiation therapy in July 2015.  Paracentesis obtained earlier today.  Awaiting cytology results to determine further treatment options.   Renal insufficiency  Assessment & Plan Patient continues with an elevated creatinine.  Patient met with permit her provider Dr. Amedeo Kinsman earlier this morning.  Permit her provider will obtain a renal consult for patient.   Hyperkalemia  Assessment & Plan Patient was given Kayexalate to take last night.  However, potassium continues elevated.  Confirmed the patient continues to hold his Lotrel.   Abdominal pain, unspecified site  Assessment & Plan Patient states that  he does feel much better since obtaining a paracentesis today.  Awaiting peritoneal fluid cytology results.  Patient will quite possibly need repeat paracentesis if ascites returns.   Patient stated understanding of all instructions; and was in agreement with this plan of care. The patient knows to call the clinic with any problems, questions or concerns.   Review/collaboration with Dr. Benay Spice regarding all aspects of patient's visit today.   Total time spent with patient was 15 minutes;  with greater than 90 percent of that time spent in face to face counseling regarding his symptoms, and coordination of care and follow up.  Disclaimer: This note was dictated with voice recognition software. Similar sounding words can inadvertently be transcribed and may not be corrected upon review.   Drue Second, NP 05/25/2014

## 2014-05-25 NOTE — Procedures (Signed)
Successful US guided paracentesis from [facility].  Yielded 5.4 liters of amber colored fluid.  No immediate complications.  Pt tolerated well.   Specimen was sent for labs.  Tsosie Billing D PA-C 05/25/2014 2:03 PM

## 2014-05-25 NOTE — Assessment & Plan Note (Signed)
Patient is status post small bowel resection in February 2015.  Completed radiation therapy in July 2015.  Paracentesis obtained earlier today.  Awaiting cytology results to determine further treatment options.

## 2014-05-25 NOTE — Progress Notes (Signed)
[name]   Chief Complaint  Patient presents with  . Follow-up    HPI: Dean Owens 68 y.o. 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  Patient is status post small bowel resection in February 2015; and completed radiation therapy in July 2015.  Patient presented today for followup.  He has obtained to paracentesis within this past week or so for abdominal discomfort and distention.  He states that his abdominal distention has returned; making him uncomfortable once again.  Patient stated he did 3 days of Kayexalate therapy as previously directed for a hyperkalemia.  He states that he has not yet heard back regarding a renal consult.   HPI  CURRENT THERAPY: No active treatment plan for patient.   ROS  Past Medical History  Diagnosis Date  . Hypertension   . Type 2 diabetes mellitus   . Hypothyroidism   . History of colon cancer NO RECURRENCE    S/P COLECTOMY X3  LAST ONE 1998--  [name]  . ED (erectile dysfunction) of organic origin   . Right knee DJD   . Eczema   . OA (osteoarthritis)     LEFT KNEE  . Wears glasses   . History of iron deficiency anemia   . History of squamous cell carcinoma excision   . Accelerated hypertension 10/21/2013  . Type II diabetes mellitus 10/21/2013  . Unspecified hypothyroidism 10/21/2013  . 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 Lynch syndrome   . History of nonmelanoma skin cancer   . Anemia   . Recurrent prostate carcinoma FOLLOWED BY DR Gaynelle Arabian    DX 2007  S/P RADIOACTIVE SEED IMPLANTS  . 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 Ileus     s/p op 3 days  . History of blood transfusion 09/2013    [facility]  . S/P radiation therapy 01/19/14-03/01/14    retroperitonela sarcoma/abdomen    Past Surgical History  Procedure Laterality Date  . Givens capsule study N/A 07/05/2013    Procedure: GIVENS CAPSULE STUDY;  Surgeon: [name], MD;  Location: [place];  Service:  Endoscopy;  Laterality: N/A;  . Radioactive prostate seed implants  OCT 2007  . Knee arthroscopy Bilateral 2006 &  2007  . Tonsillectomy  AS CHILD  . Appendectomy  AGE 71  . Cryoablation N/A 10/01/2013    Procedure: CRYO ABLATION PROSTATE;  Surgeon: [name], MD;  Location: [facility];  Service: Urology;  Laterality: N/A;  . Prostate surgery    . Right colectomy  09/07/1995  . Sigmoidectomy  11/13/95  . Total colectomy  12/29/1997    ileorectal anastomosis  . Laparotomy N/A 10/14/2013    Procedure: EXPLORATORY LAPAROTOMY;  Surgeon: [name], MD;  Location: [place];  Service: General;  Laterality: N/A;  . Bowel resection N/A 10/14/2013    Procedure: SMALL BOWEL RESECTION;  Surgeon: [name], MD;  Location: [place];  Service: General;  Laterality: N/A;  . Lysis of adhesion N/A 10/14/2013    Procedure: LYSIS OF ADHESION;  Surgeon: [name], MD;  Location: [place];  Service: General;  Laterality: N/A;  . 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 Cystoscopy with retrograde pyelogram, ureteroscopy and stent placement Right 12/14/2013    Procedure: CYSTOSCOPY WITH RIGHT RETROGRADE  PYELOGRAM / INSERTION RIGHT URETERAL STENT;  Surgeon: [name], MD;  Location: [facility];  Service: Urology;  Laterality: Right;    has Colon cancer; Prostate cancer; Protein-calorie malnutrition, severe; Small bowel tumor; Accelerated hypertension; Type II diabetes mellitus; Unspecified hypothyroidism; Small bowel carcinoma; Lynch syndrome; Retroperitoneal liposarcoma; Sarcoma of retroperitoneum; Diarrhea; Anorexia; Nausea alone; Neck pain; Renal insufficiency; Hyperkalemia; Hyperphosphatemia; Transaminitis; Hypoalbuminemia; Leukocytosis, unspecified; and Abdominal pain, unspecified site on his problem list.     has No Known Allergies.      Medication List       This list is accurate as of: 05/24/14 11:59 PM.  Always use your most recent med list.               acetaminophen 325 MG tablet  Commonly known as:  TYLENOL  Take 2 tablets (650 mg total) by mouth every 6 (six) hours as needed for mild pain, moderate pain, fever or headache.     amLODipine-benazepril 5-10 MG per capsule  Commonly known as:  LOTREL  Take 1 capsule by mouth every morning.     diphenoxylate-atropine 2.5-0.025 MG per tablet  Commonly known as:  LOMOTIL  Take 1 tablet by mouth 4 (four) times daily as needed for diarrhea or loose stools.     ferrous sulfate 325 (65 FE) MG tablet  Take 325 mg by mouth daily.     furosemide 20 MG tablet  Commonly known as:  LASIX  Take 1 tablet (20 mg total) by mouth daily.     glipiZIDE 5 MG tablet  Commonly known as:  GLUCOTROL  Take 5 mg by mouth daily before breakfast.     levothyroxine 50 MCG tablet  Commonly known as:  SYNTHROID, LEVOTHROID  Take 50 mcg by mouth daily before breakfast.     multivitamin with minerals Tabs tablet  Take 1 tablet by mouth daily.     oxyCODONE-acetaminophen 5-325 MG per tablet  Commonly known as:  PERCOCET/ROXICET  Take 1-2 tablets by mouth every 6 (six) hours as needed for severe pain.     sodium polystyrene 15 GM/60ML suspension  Commonly known as:  KAYEXALATE  Take 240 mLs (60 g total) by mouth daily.         PHYSICAL EXAMINATION  Blood pressure 125/83, pulse 109, temperature 98.1 F (36.7 C), temperature source Oral, resp. rate 18, height _0  (1.803 m), weight 196 lb 11.2 oz (89.223 kg), SpO2 100.00%.  Physical Exam  Nursing note and vitals reviewed. Constitutional: He is oriented to person, place, and time and well-developed, well-nourished, and in no distress. No distress.  HENT:  Head: Normocephalic and atraumatic.  Eyes: Conjunctivae are normal. Pupils are equal, round, and reactive to light. No scleral icterus.  Neck: Normal range of motion.   Cardiovascular: Normal rate, regular rhythm, normal heart sounds and intact distal pulses.   Pulmonary/Chest: Effort normal and breath sounds normal. No respiratory distress.  Abdominal: Soft. He exhibits distension. He exhibits no mass. There is no tenderness. There is no rebound and no guarding.  Mild to moderate abdominal distention; but no specific discomfort with palpation.  Musculoskeletal: Normal range of motion. He exhibits no edema and no tenderness.  Neurological: He is alert and oriented to person, place, and time. Gait normal.  Skin: Skin is warm and dry. No rash noted. No erythema.  Psychiatric: Affect normal.    LABORATORY DATA:. CBC  Lab Results  Component Value Date   WBC 20.6* 05/24/2014   RBC 4.17* 05/24/2014  HGB 11.0* 05/24/2014   HCT 33.4* 05/24/2014   PLT 400 05/24/2014   MCV 80.1 05/24/2014   MCH 26.4* 05/24/2014   MCHC 32.9 05/24/2014   RDW 17.3* 05/24/2014   LYMPHSABS 1.1 05/24/2014   MONOABS 1.2* 05/24/2014   EOSABS 0.1 05/24/2014   BASOSABS 0.1 05/24/2014     CMET  Lab Results  Component Value Date   NA 125* 05/24/2014   K 6.4 No visable hemolysis* 05/24/2014   CL 100 12/22/2013   CO2 24 05/24/2014   GLUCOSE 157* 05/24/2014   BUN 33.0* 05/24/2014   CREATININE 2.0* 05/24/2014   CALCIUM 8.9 05/24/2014   PROT 6.8 05/24/2014   ALBUMIN 2.2* 05/24/2014   AST 95* 05/24/2014   ALT 16 05/24/2014   ALKPHOS 411* 05/24/2014   BILITOT 0.44 05/24/2014   GFRNONAA 89* 12/22/2013   GFRAA >90 12/22/2013   ASSESSMENT/PLAN:    Colon cancer  Assessment & Plan Patient status post small bowel resection and debris 2015.  Patient completed radiation therapy in July 2015.  Patient has had 2 previous paracentesis; with cytology negative both times.  Patient will obtain a repeat paracentesis tomorrow 05/25/2014; and we will order a repeat fluid cytology at that time.  Will also order a CT-guided biopsy of the mesenteric mass to be completed as soon as possible.  Patient will return to  the cancer center Friday afternoon 05/27/2014 for followup.  Hopefully, by that time we will have both peritoneal cytology results and/or biopsy results so we can initiate a plan of treatment.   Renal insufficiency  Assessment & Plan Creatinine has improved from 2.1 down to 2.0.  Confirm that there has not been a renal consult made per primary care provider as of yet.  Dr. Benay Spice did speak with Dr. Posey Pronto nephrologist; and he suggested patient initiate Kayexalate and Lasix for the time being.  Will order a renal consult ASAP.   Hyperkalemia  Assessment & Plan A potassium today was 6.4.  A confirmed the patient is no longer taking Lotrel.  Per Dr. Serita Grit advice- patient was instructed to initiate Kayexalate 60 g per day; and Lasix 40 mg per day.  We will recheck labs and 48 hours.   Abdominal pain, unspecified site  Assessment & Plan Patient is complaining of recurrent abdominal discomfort and distention.  Will order a repeat paracentesis ASAP.  Pending scheduling of CT-guided biopsy of mesenteric mass is well.   Patient stated understanding of all instructions; and was in agreement with this plan of care. The patient knows to call the clinic with any problems, questions or concerns.   This was a shared visit with Dr. Benay Spice today..   Total time spent with patient was 40 minutes;  with greater than 75 percent of that time spent in face to face counseling regarding his symptoms, review of his recent cytology results, and coordination of care and follow up.  Disclaimer: This note was dictated with voice recognition software. Similar sounding words can inadvertently be transcribed and may not be corrected upon review.   Drue Second, NP 05/25/2014   This was a shared visit. The paracentesis prodedures have been nondiagnostic to date. I discussed with IR and they will attempt a CT guided biopsy of a peritoneal nodule.  He has persistent renal failure. I discussed the case with  nephrology and they will see him as an outpatient.  He will begin lasix and kayexelate.  Plan to begin FOLFOX if met.small bowel carcinoma is confirmed.  Julieanne Manson, MD

## 2014-05-25 NOTE — Telephone Encounter (Signed)
Called patient for followup.  Patient obtained a repeat paracentesis this morning; and states that they removed over 5 L of fluid.  Patient reports that he feels much better now.   Confirmed with patient that he is taking the Kayexalate 60 g on a daily basis.  Also advised patient to decrease the Lasix to 20 mg per day.  Recommended that patient take the Lasix as early in the day as possible.  Will also order a renal consult ASAP as well.   Advised patient that he will have his repeat labs drawn tomorrow 05/26/2014 per interventional radiology while obtaining a CT-guided biopsy.  Advised patient that I would call him with his lab results as soon as I receive them.  Will schedule patient for Friday afternoon 05/27/2014 for additional followup.  Hopefully by that time-we will have peritoneal cytology results and/or biopsy results.  Patient stated understanding of all instructions; and was in agreement with this plan of care.

## 2014-05-26 ENCOUNTER — Other Ambulatory Visit: Payer: Self-pay | Admitting: Nurse Practitioner

## 2014-05-26 ENCOUNTER — Telehealth: Payer: Self-pay | Admitting: *Deleted

## 2014-05-26 ENCOUNTER — Encounter (HOSPITAL_COMMUNITY): Payer: Self-pay

## 2014-05-26 ENCOUNTER — Ambulatory Visit: Payer: Medicare Other | Admitting: Nurse Practitioner

## 2014-05-26 ENCOUNTER — Telehealth: Payer: Self-pay | Admitting: Oncology

## 2014-05-26 ENCOUNTER — Ambulatory Visit (HOSPITAL_COMMUNITY)
Admission: RE | Admit: 2014-05-26 | Discharge: 2014-05-26 | Disposition: A | Discharge status: Home / Self Care | Payer: Medicare Other | Source: Ambulatory Visit | Attending: Nurse Practitioner | Admitting: Nurse Practitioner

## 2014-05-26 ENCOUNTER — Other Ambulatory Visit: Payer: Medicare Other

## 2014-05-26 ENCOUNTER — Ambulatory Visit (HOSPITAL_COMMUNITY)
Admission: RE | Admit: 2014-05-26 | Discharge: 2014-05-26 | Disposition: A | Discharge status: Home / Self Care | Payer: Medicare Other | Source: Ambulatory Visit | Attending: Oncology | Admitting: Oncology

## 2014-05-26 DIAGNOSIS — E875 Hyperkalemia: Secondary | ICD-10-CM

## 2014-05-26 DIAGNOSIS — C189 Malignant neoplasm of colon, unspecified: Secondary | ICD-10-CM

## 2014-05-26 DIAGNOSIS — R188 Other ascites: Secondary | ICD-10-CM | POA: Diagnosis not present

## 2014-05-26 LAB — PROTIME-INR
INR: 1.27 (ref 0.00–1.49)
PROTHROMBIN TIME: 15.9 s — AB (ref 11.6–15.2)

## 2014-05-26 LAB — BASIC METABOLIC PANEL
ANION GAP: 11 (ref 5–15)
BUN: 34 mg/dL — ABNORMAL HIGH (ref 6–23)
CO2: 23 meq/L (ref 19–32)
CREATININE: 1.98 mg/dL — AB (ref 0.50–1.35)
Calcium: 8.4 mg/dL (ref 8.4–10.5)
Chloride: 95 mEq/L — ABNORMAL LOW (ref 96–112)
GFR calc non Af Amer: 33 mL/min — ABNORMAL LOW (ref >90)
GFR, EST AFRICAN AMERICAN: 38 mL/min — AB (ref >90)
Glucose, Bld: 130 mg/dL — ABNORMAL HIGH (ref 70–99)
Potassium: 6.2 mEq/L — ABNORMAL HIGH (ref 3.7–5.3)
SODIUM: 129 meq/L — AB (ref 137–147)

## 2014-05-26 LAB — CBC
HEMATOCRIT: 33 % — AB (ref 39.0–52.0)
HEMOGLOBIN: 10.8 g/dL — AB (ref 13.0–17.0)
MCH: 26.2 pg (ref 26.0–34.0)
MCHC: 32.7 g/dL (ref 30.0–36.0)
MCV: 79.9 fL (ref 78.0–100.0)
Platelets: 413 10*3/uL — ABNORMAL HIGH (ref 150–400)
RBC: 4.13 MIL/uL — ABNORMAL LOW (ref 4.22–5.81)
RDW: 17.1 % — ABNORMAL HIGH (ref 11.5–15.5)
WBC: 21.3 10*3/uL — AB (ref 4.0–10.5)

## 2014-05-26 LAB — GLUCOSE, CAPILLARY: GLUCOSE-CAPILLARY: 118 mg/dL — AB (ref 70–99)

## 2014-05-26 LAB — APTT: APTT: 39 s — AB (ref 24–37)

## 2014-05-26 MED ORDER — SODIUM POLYSTYRENE SULFONATE 15 GM/60ML PO SUSP: 60.0000 g | Freq: Every day | ORAL | Status: DC

## 2014-05-26 MED ORDER — SODIUM CHLORIDE 0.9 % IV SOLN
1.0000 g | Freq: Once | INTRAVENOUS | Status: AC
  Administered 2014-05-26: 1 g via INTRAVENOUS
  Filled 2014-05-26: qty 10

## 2014-05-26 MED ORDER — FENTANYL CITRATE 0.05 MG/ML IJ SOLN
INTRAMUSCULAR | Status: AC | PRN
  Administered 2014-05-26: 50 ug via INTRAVENOUS
  Administered 2014-05-26: 100 ug via INTRAVENOUS
  Administered 2014-05-26: 50 ug via INTRAVENOUS

## 2014-05-26 MED ORDER — MIDAZOLAM HCL 2 MG/2ML IJ SOLN
INTRAMUSCULAR | Status: AC | PRN
Start: 2014-05-26 — End: 2014-05-26
  Administered 2014-05-26 (×2): 1 mg via INTRAVENOUS

## 2014-05-26 MED ORDER — MIDAZOLAM HCL 2 MG/2ML IJ SOLN
INTRAMUSCULAR | Status: AC
  Filled 2014-05-26: qty 4

## 2014-05-26 MED ORDER — HYDROCODONE-ACETAMINOPHEN 5-325 MG PO TABS
1.0000 | ORAL_TABLET | ORAL | Status: DC | PRN
  Filled 2014-05-26: qty 2

## 2014-05-26 MED ORDER — SODIUM CHLORIDE 0.9 % IV SOLN
INTRAVENOUS | Status: DC
  Administered 2014-05-26: 08:00:00 via INTRAVENOUS

## 2014-05-26 MED ORDER — FENTANYL CITRATE 0.05 MG/ML IJ SOLN
INTRAMUSCULAR | Status: AC
  Filled 2014-05-26: qty 4

## 2014-05-26 NOTE — Telephone Encounter (Signed)
Called pt per Selena Lesser, NP. Patient sleeping so spoke with wife, Jenny Reichmann. Told her that pt needs to continue Kayexalate 60g daily, lasix 20mg  daily and needs to have labs and see [employer] tomorrow. Wife agreeable to come for appt tomorrow at 1:30 for lab and then [facility] at 2pm.

## 2014-05-26 NOTE — Discharge Instructions (Signed)
Conscious Sedation, Adult, Care After °Refer to this sheet in the next few weeks. These instructions provide you with information on caring for yourself after your procedure. Your health care provider may also give you more specific instructions. Your treatment has been planned according to current medical practices, but problems sometimes occur. Call your health care provider if you have any problems or questions after your procedure. °WHAT TO EXPECT AFTER THE PROCEDURE  °After your procedure: °· You may feel sleepy, clumsy, and have poor balance for several hours. °· Vomiting may occur if you eat too soon after the procedure. °HOME CARE INSTRUCTIONS °· Do not participate in any activities where you could become injured for at least 24 hours. Do not: °¨ Drive. °¨ Swim. °¨ Ride a bicycle. °¨ Operate heavy machinery. °¨ Cook. °¨ Use power tools. °¨ Climb ladders. °¨ Work from a high place. °· Do not make important decisions or sign legal documents until you are improved. °· If you vomit, drink water, juice, or soup when you can drink without vomiting. Make sure you have little or no nausea before eating solid foods. °· Only take over-the-counter or prescription medicines for pain, discomfort, or fever as directed by your health care provider. °· Make sure you and your family fully understand everything about the medicines given to you, including what side effects may occur. °· You should not drink alcohol, take sleeping pills, or take medicines that cause drowsiness for at least 24 hours. °· If you smoke, do not smoke without supervision. °· If you are feeling better, you may resume normal activities 24 hours after you were sedated. °· Keep all appointments with your health care provider. °SEEK MEDICAL CARE IF: °· Your skin is pale or bluish in color. °· You continue to feel nauseous or vomit. °· Your pain is getting worse and is not helped by medicine. °· You have bleeding or swelling. °· You are still sleepy or  feeling clumsy after 24 hours. °SEEK IMMEDIATE MEDICAL CARE IF: °· You develop a rash. °· You have difficulty breathing. °· You develop any type of allergic problem. °· You have a fever. °MAKE SURE YOU: °· Understand these instructions. °· Will watch your condition. °· Will get help right away if you are not doing well or get worse. °Document Released: 06/02/2013 Document Reviewed: 06/02/2013 °[employer]® Patient Information ©2015 [employer]. This information is not intended to replace advice given to you by your health care provider. Make sure you discuss any questions you have with your health care provider. °Biopsy °Care After °Refer to this sheet in the next few weeks. These instructions provide you with information on caring for yourself after your procedure. Your caregiver may also give you more specific instructions. Your treatment has been planned according to current medical practices, but problems sometimes occur. Call your caregiver if you have any problems or questions after your procedure. °If you had a fine needle biopsy, you may have soreness at the biopsy site for 1 to 2 days. If you had an open biopsy, you may have soreness at the biopsy site for 3 to 4 days. °HOME CARE INSTRUCTIONS  °· You may resume normal diet and activities as directed. °· Change bandages (dressings) as directed. If your wound was closed with a skin glue (adhesive), it will wear off and begin to peel in 7 days. °· Only take over-the-counter or prescription medicines for pain, discomfort, or fever as directed by your caregiver. °· Ask your caregiver when you can   bathe and get your wound wet. °SEEK IMMEDIATE MEDICAL CARE IF:  °· You have increased bleeding (more than a small spot) from the biopsy site. °· You notice redness, swelling, or increasing pain at the biopsy site. °· You have pus coming from the biopsy site. °· You have a fever. °· You notice a bad smell coming from the biopsy site or dressing. °· You have a rash, have  difficulty breathing, or have any allergic problems. °MAKE SURE YOU:  °· Understand these instructions. °· Will watch your condition. °· Will get help right away if you are not doing well or get worse. °Document Released: 03/01/2005 Document Revised: 11/04/2011 Document Reviewed: 02/07/2011 °Exit[employer]® Patient Information ©2015 [employer]. This information is not intended to replace advice given to you by your health care provider. Make sure you discuss any questions you have with your health care provider. ° °

## 2014-05-26 NOTE — Procedures (Addendum)
CT guided core biopsies of mass in right posterior mesentery/retroperitoneum.  No immediate complication.    CORRECTION:  Pre-procedure and post-procedure dx is small bowel cancer NOT small cell carcinoma.

## 2014-05-26 NOTE — Telephone Encounter (Signed)
, °

## 2014-05-26 NOTE — H&P (Signed)
Chief Complaint: "I am here for a biopsy."  Referring Physician(s): Bacon,Cynthia  History of Present Illness: Dean Owens is a 68 y.o. 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 Patient with new ascites and mesenteric implants on imaging s/p multiple paracentesis with negative cytology. Scheduled today for image guided mesenteric implant biopsy with moderate sedation. He denies any chest pain, shortness of breath or palpitations. He denies any active signs of bleeding or excessive bruising. He denies any recent fever or chills. The patient denies any history of sleep apnea or chronic oxygen use. He has previously tolerated sedation without complications.    Past Medical History  Diagnosis Date  . Hypertension   . Type 2 diabetes mellitus   . Hypothyroidism   . History of colon cancer NO RECURRENCE    S/P COLECTOMY X3  LAST ONE 1998--  [place]  . ED (erectile dysfunction) of organic origin   . Right knee DJD   . Eczema   . OA (osteoarthritis)     LEFT KNEE  . Wears glasses   . History of iron deficiency anemia   . History of squamous cell carcinoma excision   . Accelerated hypertension 10/21/2013  . Type II diabetes mellitus 10/21/2013  . Unspecified hypothyroidism 10/21/2013  . 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 Lynch syndrome   . History of nonmelanoma skin cancer   . Anemia   . Recurrent prostate carcinoma FOLLOWED BY DR Gaynelle Arabian    DX 2007  S/P RADIOACTIVE SEED IMPLANTS  . 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 Ileus     s/p op 3 days  . History of blood transfusion 09/2013    PRBC  . S/P radiation therapy 01/19/14-03/01/14    retroperitonela sarcoma/abdomen    Past Surgical History  Procedure Laterality Date  . Givens capsule study N/A 07/05/2013    Procedure: GIVENS CAPSULE STUDY;  Surgeon: [name], MD;  Location: [place];   Service: Endoscopy;  Laterality: N/A;  . Radioactive prostate seed implants  OCT 2007  . Knee arthroscopy Bilateral 2006 &  2007  . Tonsillectomy  AS CHILD  . Appendectomy  AGE 68  . Cryoablation N/A 10/01/2013    Procedure: CRYO ABLATION PROSTATE;  Surgeon: [name], MD;  Location: [facility];  Service: Urology;  Laterality: N/A;  . Prostate surgery    . Right colectomy  09/07/1995  . Sigmoidectomy  11/13/95  . Total colectomy  12/29/1997    ileorectal anastomosis  . Laparotomy N/A 10/14/2013    Procedure: EXPLORATORY LAPAROTOMY;  Surgeon: [name], MD;  Location: [place];  Service: General;  Laterality: N/A;  . Bowel resection N/A 10/14/2013    Procedure: SMALL BOWEL RESECTION;  Surgeon: [name], MD;  Location: [place];  Service: General;  Laterality: N/A;  . Lysis of adhesion N/A 10/14/2013    Procedure: LYSIS OF ADHESION;  Surgeon: [name], MD;  Location: [place];  Service: General;  Laterality: N/A;  . 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 Cystoscopy with retrograde pyelogram, ureteroscopy and stent placement Right 12/14/2013    Procedure: CYSTOSCOPY WITH RIGHT RETROGRADE PYELOGRAM / INSERTION RIGHT URETERAL STENT;  Surgeon: [name], MD;  Location:  [facility];  Service: Urology;  Laterality: Right;    Allergies: Review of patient's allergies indicates no known allergies.  Medications: Prior to Admission medications   Medication Sig Start Date End Date Taking? Authorizing Provider  amLODipine-benazepril (LOTREL) 10-20 MG per capsule Take 1 capsule by mouth daily.    Historical Provider, MD  diphenoxylate-atropine (LOMOTIL) 2.5-0.025 MG per tablet Take 1 tablet by mouth 4 (four) times daily as needed for diarrhea or loose stools.    Historical Provider, MD  ferrous sulfate 325 (65 FE) MG tablet Take  325 mg by mouth daily with breakfast.  11/18/13   Historical Provider, MD  furosemide (LASIX) 20 MG tablet Take 20 mg by mouth daily.    Historical Provider, MD  glipiZIDE (GLUCOTROL) 5 MG tablet Take 5 mg by mouth daily before breakfast.    Historical Provider, MD  levothyroxine (SYNTHROID, LEVOTHROID) 50 MCG tablet Take 50 mcg by mouth daily before breakfast.    Historical Provider, MD  Multiple Vitamins-Minerals (ADULT GUMMY) CHEW Chew 2 capsules by mouth daily.    Historical Provider, MD  oxyCODONE-acetaminophen (PERCOCET/ROXICET) 5-325 MG per tablet Take 1-2 tablets by mouth every 6 (six) hours as needed for severe pain.    Historical Provider, MD  sodium polystyrene (KAYEXALATE) 15 GM/60ML suspension Take 240 mLs (60 g total) by mouth daily. 05/24/14   Drue Second, NP    Family History  Problem Relation Age of Onset  . Heart failure Mother   . Heart failure Father   . Ovarian cancer Maternal Aunt   . Uterine cancer Paternal Aunt 71  . Colon cancer Paternal Aunt 87  . Cancer Paternal Aunt 78    sebaceous adenoma; Muir-Torre syndrome  . Colon cancer Paternal Grandmother   . Colon cancer Brother 65  . Colon cancer Cousin 66  . Uterine cancer Cousin 40  . Colon polyps Cousin   . Brain cancer Cousin 17    astrocytoma; Mary's daughter  . Colon polyps Cousin   . Lung cancer Cousin 26  . Breast cancer Cousin 103  . Uterine cancer Cousin 77  . Colon cancer Cousin 30    History   Social History  . Marital Status: Married    Spouse Name: Jenny Reichmann    Number of Children: 3  . Years of Education: N/A   Occupational History  .     Social History Main Topics  . Smoking status: Former Smoker -- 0.50 packs/day for 15 years    Types: Cigarettes    Quit date: 09/07/2013  . Smokeless tobacco: Never Used  . Alcohol Use: Yes     Comment: OCCASIONAL beer  . Drug Use: No  . Sexual Activity: Yes   Other Topics Concern  . None   Social History Narrative   Married, wife Caren Griffins for 40+  years   Mortgage industry-employed by [employer]   # 3 children-#2 girls and #1 boy   #4 grandchildren (boys)   No pets or hobbies (used to play golf)    Review of Systems: A 12 point ROS discussed and pertinent positives are indicated in the HPI above.  All other systems are negative.  Review of Systems  Vital Signs: BP 148/64  Pulse 91  Temp(Src) 97.8 F (36.6 C) (Oral)  Resp 18  SpO2 99%  Physical Exam  Constitutional: He is oriented to person, place, and time. He appears well-developed and well-nourished. No distress.  HENT:  Head: Normocephalic and atraumatic.  Cardiovascular: Normal rate and regular rhythm.  Exam reveals no gallop and no friction rub.   No murmur heard. Pulmonary/Chest: Effort normal and breath sounds normal. No respiratory distress. He has no wheezes. He has no rales.  Abdominal: Soft. Bowel sounds are normal. He exhibits no distension. There is tenderness.  Neurological: He is alert and oriented to person, place, and time.  Skin: Skin is warm and dry. He is not diaphoretic.  Psychiatric: He has a normal mood and affect. His behavior is normal. Thought content normal.    Imaging: Ct Abdomen Pelvis Wo Contrast  05/17/2014   CLINICAL DATA:  Colon cancer.  Abdominal distention.  EXAM: CT CHEST, ABDOMEN AND PELVIS WITHOUT CONTRAST  TECHNIQUE: Multidetector CT imaging of the chest, abdomen and pelvis was performed following the standard protocol without IV contrast.  COMPARISON:  11/15/2013  FINDINGS: CT CHEST FINDINGS  Chest wall:  No supraclavicular or axillary mass or adenopathy. The thyroid gland is unremarkable. The bony thorax is intact. Moderate degenerative changes involving the thoracic spine. Remote healed left rib fractures are noted.  Mediastinum: The heart is normal in size. No pericardial effusion. Coronary artery calcifications are noted. There is mild tortuosity, ectasia and calcification of the thoracic aorta but no focal aneurysm. The esophagus  is grossly normal.  Lungs:  No worrisome pulmonary lesions. There is right lower lobe scarring change. No infiltrates or edema. There is a small right pleural effusion and minimal overlying atelectasis.  CT ABDOMEN AND PELVIS FINDINGS  There is large volume ascites surrounding the liver and spleen and continuing down into the pericolic gutters and mesenteric. No obvious hepatic lesions. The spleen is normal in size. The pancreas is grossly normal. Stable calcifications in the pancreatic head. The adrenal glands and kidneys are grossly normal without contrast. There are enlarged gastrohepatic ligament, subdiaphragmatic, celiac axis, mesenteric and retroperitoneal lymph nodes which are progressive when compared to prior studies. There is also extensive mesenteric and omental nodularity consistent with peritoneal carcinomatosis. Retrocrural adenopathy is noted.  There are surgical changes in the right lower quadrant from a previous soft tissue mass resection. There is a residual cystic structure anterior to the right psoas muscle which is most likely a postoperative seroma. A small bowel resection is noted in the left lower quadrant/pelvis. No complicating features. Peritoneal implants are noted in the pelvis.  The stomach, duodenum, small bowel and colon are grossly normal. No obstructive findings. The bladder, prostate gland and seminal vesicles are unremarkable. Prostate seeds are noted. The aorta demonstrates stable advanced atherosclerotic calcifications.  IMPRESSION: 1. Stable CT appearance of the chest. No findings for metastatic disease. 2. Interval development of large volume abdominal ascites and diffuse peritoneal carcinomatosis and lymphadenopathy. 3. Small right pleural effusion and overlying atelectasis.   Electronically Signed   By: Kalman Jewels M.D.   On: 05/17/2014 17:08   Dg Abd 1 View  05/16/2014   CLINICAL DATA:  History colon cancer.  Abdominal pain.  EXAM: ABDOMEN - 1 VIEW  COMPARISON:   12/20/2013.  FINDINGS: Surgical clips are noted throughout the abdomen and pelvis. Increased density is noted throughout the abdomen. These findings are consistent with ascites. There is no bowel distention. No free air is identified. No acute bony abnormalities identified.  IMPRESSION: 1. Findings consistent with ascites. This can be confirmed with ultrasound. 2. Surgical clips noted throughout the abdomen and pelvis. No evidence of bowel distention or free air.   Electronically Signed   By: [place]   On: 05/16/2014 12:44   Ct Chest Wo  Contrast  05/17/2014   CLINICAL DATA:  Colon cancer.  Abdominal distention.  EXAM: CT CHEST, ABDOMEN AND PELVIS WITHOUT CONTRAST  TECHNIQUE: Multidetector CT imaging of the chest, abdomen and pelvis was performed following the standard protocol without IV contrast.  COMPARISON:  11/15/2013  FINDINGS: CT CHEST FINDINGS  Chest wall:  No supraclavicular or axillary mass or adenopathy. The thyroid gland is unremarkable. The bony thorax is intact. Moderate degenerative changes involving the thoracic spine. Remote healed left rib fractures are noted.  Mediastinum: The heart is normal in size. No pericardial effusion. Coronary artery calcifications are noted. There is mild tortuosity, ectasia and calcification of the thoracic aorta but no focal aneurysm. The esophagus is grossly normal.  Lungs:  No worrisome pulmonary lesions. There is right lower lobe scarring change. No infiltrates or edema. There is a small right pleural effusion and minimal overlying atelectasis.  CT ABDOMEN AND PELVIS FINDINGS  There is large volume ascites surrounding the liver and spleen and continuing down into the pericolic gutters and mesenteric. No obvious hepatic lesions. The spleen is normal in size. The pancreas is grossly normal. Stable calcifications in the pancreatic head. The adrenal glands and kidneys are grossly normal without contrast. There are enlarged gastrohepatic ligament,  subdiaphragmatic, celiac axis, mesenteric and retroperitoneal lymph nodes which are progressive when compared to prior studies. There is also extensive mesenteric and omental nodularity consistent with peritoneal carcinomatosis. Retrocrural adenopathy is noted.  There are surgical changes in the right lower quadrant from a previous soft tissue mass resection. There is a residual cystic structure anterior to the right psoas muscle which is most likely a postoperative seroma. A small bowel resection is noted in the left lower quadrant/pelvis. No complicating features. Peritoneal implants are noted in the pelvis.  The stomach, duodenum, small bowel and colon are grossly normal. No obstructive findings. The bladder, prostate gland and seminal vesicles are unremarkable. Prostate seeds are noted. The aorta demonstrates stable advanced atherosclerotic calcifications.  IMPRESSION: 1. Stable CT appearance of the chest. No findings for metastatic disease. 2. Interval development of large volume abdominal ascites and diffuse peritoneal carcinomatosis and lymphadenopathy. 3. Small right pleural effusion and overlying atelectasis.   Electronically Signed   By: Kalman Jewels M.D.   On: 05/17/2014 17:08   US Renal  05/17/2014   CLINICAL DATA:  History of colon cancer and liposarcoma. Increased creatinine.  EXAM: RENAL/URINARY TRACT ULTRASOUND COMPLETE  COMPARISON:  None.  FINDINGS: Right Kidney:  Length: 11.3 cm. Echogenicity within normal limits. No mass or hydronephrosis visualized.  Left Kidney:  Length: 11.8 cm. Echogenicity within normal limits. No hydronephrosis visualized. There are simple cysts within the left kidney, largest in the upper pole left kidney measuring 1.5 cm.  Bladder:  Limited visualization.  Others: There is ascites.  IMPRESSION: No acute abnormality within the kidneys. Simple cysts within the left kidney. Ascites.   Electronically Signed   By: Abelardo Diesel M.D.   On: 05/17/2014 10:13   US  Paracentesis  05/25/2014   CLINICAL DATA:  History of colon cancer, recurrent ascites, request for paracentesis.  EXAM: ULTRASOUND GUIDED DIAGNOSTIC AND THERAPEUTIC PARACENTESIS  COMPARISON:  05/20/14.  PROCEDURE: An ultrasound guided paracentesis was thoroughly discussed with the patient and questions answered. The benefits, risks, alternatives and complications were also discussed. The patient understands and wishes to proceed with the procedure. Written consent was obtained.  Ultrasound was performed to localize and mark an adequate pocket of fluid in the left lower quadrant of the abdomen.  The area was then prepped and draped in the normal sterile fashion. 1% Lidocaine was used for local anesthesia. Under ultrasound guidance a 19 gauge Yueh catheter was introduced. Paracentesis was performed. The catheter was removed and a dressing applied.  Complications:  None immediate.  FINDINGS: A total of approximately 5.4 liters of amber colored fluid was removed. A fluid sample was sent for laboratory analysis.  IMPRESSION: Successful ultrasound guided paracentesis yielding 5.4 liters of ascites.  Read By:  Tsosie Billing PA-C   Electronically Signed   By: Sandi Mariscal M.D.   On: 05/25/2014 14:35   US Paracentesis  05/20/2014   CLINICAL DATA:  Patient with history of small bowel carcinoma, colon cancer, liposarcoma, recurrent ascites, renal insufficiency. Request is made for diagnostic and therapeutic paracentesis up to 4 liters.  EXAM: ULTRASOUND GUIDED DIAGNOSTIC AND THERAPEUTIC PARACENTESIS  COMPARISON:  PRIOR PARACENTESIS ON 05/17/2014  PROCEDURE: An ultrasound guided paracentesis was thoroughly discussed with the patient and questions answered. The benefits, risks, alternatives and complications were also discussed. The patient understands and wishes to proceed with the procedure. Written consent was obtained.  Ultrasound was performed to localize and mark an adequate pocket of fluid in the left lower quadrant of  the abdomen. The area was then prepped and draped in the normal sterile fashion. 1% Lidocaine was used for local anesthesia. Under ultrasound guidance a 19 gauge Yueh catheter was introduced. Paracentesis was performed. The catheter was removed and a dressing applied.  Complications: None.  FINDINGS: A total of approximately 4 liters of yellow fluid was removed. A portion of the fluid was submitted to the lab for cytology.  IMPRESSION: Successful ultrasound guided diagnostic and therapeutic paracentesis yielding 4 liters of ascites.  Read by: Rowe Robert, PA-C   Electronically Signed   By: Corrie Mckusick O.D.   On: 05/20/2014 17:29   US Paracentesis  05/17/2014   CLINICAL DATA:  Patient with history of colon cancer, liposarcoma; now with ascites, renal insufficiency. Request is made for diagnostic and therapeutic paracentesis.  EXAM: ULTRASOUND GUIDED DIAGNOSTIC AND THERAPEUTIC PARACENTESIS  COMPARISON:  None.  PROCEDURE: An ultrasound guided paracentesis was thoroughly discussed with the patient and questions answered. The benefits, risks, alternatives and complications were also discussed. The patient understands and wishes to proceed with the procedure. Written consent was obtained.  Ultrasound was performed to localize and mark an adequate pocket of fluid in the left lower quadrant of the abdomen. The area was then prepped and draped in the normal sterile fashion. 1% Lidocaine was used for local anesthesia. Under ultrasound guidance a 19 gauge Yueh catheter was introduced. Paracentesis was performed. The catheter was removed and a dressing applied.  Complications: None.  FINDINGS: A total of approximately 2.4 liters of yellow fluid was removed. A fluid sample was sent for cytology. Only the above amount of fluid was aspirated at this time secondary to patient's known renal insufficiency.  IMPRESSION: Successful ultrasound guided diagnostic and therapeutic paracentesis yielding 2.4 liters of ascites.  Read by:  Rowe Robert, PA-C   Electronically Signed   By: Corrie Mckusick O.D.   On: 05/17/2014 10:33    Labs: Lab Results  Component Value Date   WBC 21.3* 05/26/2014   HCT 33.0* 05/26/2014   MCV 79.9 05/26/2014   PLT 413* 05/26/2014   NA 129* 05/26/2014   K 6.2* 05/26/2014   CL 95* 05/26/2014   CO2 23 05/26/2014   GLUCOSE 130* 05/26/2014   BUN 34* 05/26/2014   CREATININE 1.98*  05/26/2014   CALCIUM 8.4 05/26/2014   PROT 6.8 05/24/2014   ALBUMIN 2.2* 05/24/2014   AST 95* 05/24/2014   ALT 16 05/24/2014   ALKPHOS 411* 05/24/2014   BILITOT 0.44 05/24/2014   GFRNONAA 33* 05/26/2014   GFRAA 38* 05/26/2014   INR 1.27 05/26/2014   CEA 46.5* 05/24/2014    Assessment and Plan: 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  Scheduled today for image guided mesenteric implant biopsy with moderate sedation Patient has been NPO, no blood thinners taken, labs reviewed Risks and Benefits discussed with the patient. All of the patient's questions were answered, patient is agreeable to proceed. Consent signed and in chart.      SignedHedy Jacob 05/26/2014, 8:40 AM

## 2014-05-26 NOTE — Telephone Encounter (Signed)
Following up on nephrologist referral made by Selena Lesser, NP yesterday. Called [facility] and spoke with Faythe Dingwall (extension [phone]) who is new patient coordinator. She will fax over new patient referral form with required documents needed.

## 2014-05-26 NOTE — Progress Notes (Signed)
Patient's potassium 6.2 today (6.4 on 9/29 and on Kayexalate at home), Drue Second NP notified and stated she would have Dr. Gearldine Shown RN call in additional Kayexalate as patient states he is out of this medication. The patient and his family member were notified of this change.   Tsosie Billing PA-C Interventional Radiology  05/26/14  10:43 AM

## 2014-05-27 ENCOUNTER — Ambulatory Visit: Payer: Medicare Other | Admitting: Nurse Practitioner

## 2014-05-27 ENCOUNTER — Ambulatory Visit (HOSPITAL_BASED_OUTPATIENT_CLINIC_OR_DEPARTMENT_OTHER): Payer: Medicare Other | Admitting: Nurse Practitioner

## 2014-05-27 ENCOUNTER — Other Ambulatory Visit (HOSPITAL_BASED_OUTPATIENT_CLINIC_OR_DEPARTMENT_OTHER): Payer: Medicare Other

## 2014-05-27 ENCOUNTER — Telehealth: Payer: Self-pay | Admitting: *Deleted

## 2014-05-27 ENCOUNTER — Other Ambulatory Visit: Payer: Self-pay | Admitting: *Deleted

## 2014-05-27 ENCOUNTER — Telehealth: Payer: Self-pay | Admitting: Nurse Practitioner

## 2014-05-27 VITALS — BP 144/70 | HR 111 | Temp 96.0°F | Resp 18 | Ht 71.0 in | Wt 190.5 lb

## 2014-05-27 DIAGNOSIS — C499 Malignant neoplasm of connective and soft tissue, unspecified: Secondary | ICD-10-CM

## 2014-05-27 DIAGNOSIS — D649 Anemia, unspecified: Secondary | ICD-10-CM

## 2014-05-27 DIAGNOSIS — E875 Hyperkalemia: Secondary | ICD-10-CM

## 2014-05-27 DIAGNOSIS — R188 Other ascites: Secondary | ICD-10-CM

## 2014-05-27 DIAGNOSIS — C189 Malignant neoplasm of colon, unspecified: Secondary | ICD-10-CM

## 2014-05-27 DIAGNOSIS — I10 Essential (primary) hypertension: Secondary | ICD-10-CM

## 2014-05-27 DIAGNOSIS — C179 Malignant neoplasm of small intestine, unspecified: Secondary | ICD-10-CM

## 2014-05-27 DIAGNOSIS — N289 Disorder of kidney and ureter, unspecified: Secondary | ICD-10-CM

## 2014-05-27 LAB — CBC WITH DIFFERENTIAL/PLATELET
BASO%: 0.2 % (ref 0.0–2.0)
Basophils Absolute: 0 10*3/uL (ref 0.0–0.1)
EOS ABS: 0.1 10*3/uL (ref 0.0–0.5)
EOS%: 0.4 % (ref 0.0–7.0)
HCT: 35.2 % — ABNORMAL LOW (ref 38.4–49.9)
HGB: 11 g/dL — ABNORMAL LOW (ref 13.0–17.1)
LYMPH%: 4.9 % — ABNORMAL LOW (ref 14.0–49.0)
MCH: 25.6 pg — ABNORMAL LOW (ref 27.2–33.4)
MCHC: 31.3 g/dL — ABNORMAL LOW (ref 32.0–36.0)
MCV: 81.9 fL (ref 79.3–98.0)
MONO#: 1.2 10*3/uL — AB (ref 0.1–0.9)
MONO%: 5.5 % (ref 0.0–14.0)
NEUT%: 89 % — AB (ref 39.0–75.0)
NEUTROS ABS: 19.3 10*3/uL — AB (ref 1.5–6.5)
PLATELETS: 416 10*3/uL — AB (ref 140–400)
RBC: 4.29 10*6/uL (ref 4.20–5.82)
RDW: 18.5 % — ABNORMAL HIGH (ref 11.0–14.6)
WBC: 21.7 10*3/uL — AB (ref 4.0–10.3)
lymph#: 1.1 10*3/uL (ref 0.9–3.3)

## 2014-05-27 LAB — BASIC METABOLIC PANEL (CC13)
Anion Gap: 8 mEq/L (ref 3–11)
BUN: 32.7 mg/dL — ABNORMAL HIGH (ref 7.0–26.0)
CHLORIDE: 96 meq/L — AB (ref 98–109)
CO2: 22 mEq/L (ref 22–29)
Calcium: 8.5 mg/dL (ref 8.4–10.4)
Creatinine: 1.8 mg/dL — ABNORMAL HIGH (ref 0.7–1.3)
Glucose: 211 mg/dl — ABNORMAL HIGH (ref 70–140)
Potassium: 5 mEq/L (ref 3.5–5.1)
SODIUM: 126 meq/L — AB (ref 136–145)

## 2014-05-27 MED ORDER — ONDANSETRON HCL 8 MG PO TABS: 8.0000 mg | ORAL_TABLET | Freq: Three times a day (TID) | ORAL | Status: DC | PRN

## 2014-05-27 NOTE — Telephone Encounter (Signed)
Called patient this morning to briefly review his biopsy results; and to inform patient that have now ordered a stat liver MRI and a liver biopsy if a candidate.  Patient has plans to come to the [facility] later this afternoon for repeat labs in followup.  Patient stated understanding of all instructions and was in agreement with this plan of care.

## 2014-05-27 NOTE — Telephone Encounter (Signed)
Called [facility] & spoke with Sunday Spillers & asked her to check with surgeon to see if clips seen in abdomen would prevent pt from having an MRI.  She informed me that per Dr Donne Hazel that titanium clips are used & this doesn't prevent MRI but radiologist should make the final call.

## 2014-05-27 NOTE — Telephone Encounter (Signed)
Confirmed nephrology consult with Dr. Joelyn Oms at [facility]  [address].  October 13 [address].  Pt. To be there at 12:45 pm.  Written and verbal instructions given to patient and wife.

## 2014-05-28 ENCOUNTER — Ambulatory Visit (HOSPITAL_BASED_OUTPATIENT_CLINIC_OR_DEPARTMENT_OTHER)
Admission: RE | Admit: 2014-05-28 | Discharge: 2014-05-28 | Disposition: A | Discharge status: Home / Self Care | Payer: Medicare Other | Source: Ambulatory Visit | Attending: Diagnostic Radiology | Admitting: Diagnostic Radiology

## 2014-05-28 DIAGNOSIS — C772 Secondary and unspecified malignant neoplasm of intra-abdominal lymph nodes: Secondary | ICD-10-CM | POA: Diagnosis not present

## 2014-05-28 DIAGNOSIS — R188 Other ascites: Secondary | ICD-10-CM | POA: Diagnosis not present

## 2014-05-28 DIAGNOSIS — D490 Neoplasm of unspecified behavior of digestive system: Secondary | ICD-10-CM | POA: Insufficient documentation

## 2014-05-28 DIAGNOSIS — C189 Malignant neoplasm of colon, unspecified: Secondary | ICD-10-CM

## 2014-05-28 DIAGNOSIS — R599 Enlarged lymph nodes, unspecified: Secondary | ICD-10-CM | POA: Diagnosis not present

## 2014-05-30 ENCOUNTER — Other Ambulatory Visit: Payer: Self-pay | Admitting: Oncology

## 2014-05-30 ENCOUNTER — Telehealth: Payer: Self-pay | Admitting: *Deleted

## 2014-05-30 ENCOUNTER — Encounter: Payer: Self-pay | Admitting: Nurse Practitioner

## 2014-05-30 ENCOUNTER — Other Ambulatory Visit: Payer: Self-pay | Admitting: *Deleted

## 2014-05-30 ENCOUNTER — Ambulatory Visit: Payer: Medicare Other | Admitting: Nurse Practitioner

## 2014-05-30 ENCOUNTER — Telehealth: Payer: Self-pay | Admitting: Oncology

## 2014-05-30 ENCOUNTER — Encounter: Payer: Self-pay | Admitting: Oncology

## 2014-05-30 ENCOUNTER — Telehealth: Payer: Self-pay | Admitting: Nurse Practitioner

## 2014-05-30 DIAGNOSIS — R188 Other ascites: Secondary | ICD-10-CM | POA: Insufficient documentation

## 2014-05-30 DIAGNOSIS — C189 Malignant neoplasm of colon, unspecified: Secondary | ICD-10-CM

## 2014-05-30 NOTE — Progress Notes (Signed)
[place], [id number], approved ondansetron 8mg  from 05/30/14-05/31/15

## 2014-05-30 NOTE — Telephone Encounter (Signed)
Called wife and confirmed they have all the information for his U/S portal vein and U/S biopsy of liver tomorrow. Made her aware that all his labs will be done at [facility], thus he does not need to come here at 0900 for labs. Faxed lab orders to [facility] [phone].

## 2014-05-30 NOTE — Assessment & Plan Note (Signed)
Creatinine has improved to 1.8.  Patient has a renal consult scheduled for Tuesday, 06/07/2014.

## 2014-05-30 NOTE — Assessment & Plan Note (Signed)
Potassium has improved as well to 5.0.  Advised patient that he could decrease his Kayexalate to 60 g x1 more day; and then discontinue.

## 2014-05-30 NOTE — Telephone Encounter (Signed)
Patient had inquired about status of his referral to [facility]. Called office [phone] and left message for Suanne Marker in new patient scheduling.

## 2014-05-30 NOTE — Progress Notes (Signed)
[place]   Chief Complaint  Patient presents with  . Follow-up    HPI: Dean Owens 68 y.o. 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  Patient is status post small bowel resection in February 2015; and completed radiation therapy in July 2015.  Patient and his wife present today for followup.  He obtained his third paracentesis on 05/25/2014; stated that they removed a little over 5 L of fluid at that time.  He states that he doesn't much more comfortable now cycle and has minimal to no abdominal discomfort.  He has been taking Kayexalate 60 g per day; as well as Lasix 20 mg per day for hyperkalemia and poor renal function.  Patient denies any other new symptoms whatsoever.  He denies any recent fevers or chills.   HPI  CURRENT THERAPY: No active treatment plan for patient.   ROS  Past Medical History  Diagnosis Date  . Hypertension   . Type 2 diabetes mellitus   . Hypothyroidism   . History of colon cancer NO RECURRENCE    S/P COLECTOMY X3  LAST ONE 1998--  [place]  . ED (erectile dysfunction) of organic origin   . Right knee DJD   . Eczema   . OA (osteoarthritis)     LEFT KNEE  . Wears glasses   . History of iron deficiency anemia   . History of squamous cell carcinoma excision   . Accelerated hypertension 10/21/2013  . Type II diabetes mellitus 10/21/2013  . Unspecified hypothyroidism 10/21/2013  . 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 Lynch syndrome   . History of nonmelanoma skin cancer   . Anemia   . Recurrent prostate carcinoma FOLLOWED BY DR Gaynelle Arabian    DX 2007  S/P RADIOACTIVE SEED IMPLANTS  . 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 Ileus     s/p op 3 days  . History of blood transfusion 09/2013    PRBC  . S/P radiation therapy 01/19/14-03/01/14    retroperitonela sarcoma/abdomen    Past Surgical History  Procedure Laterality Date  . Givens capsule study N/A 07/05/2013    Procedure: GIVENS  CAPSULE STUDY;  Surgeon: [name], MD;  Location: [place];  Service: Endoscopy;  Laterality: N/A;  . Radioactive prostate seed implants  OCT 2007  . Knee arthroscopy Bilateral 2006 &  2007  . Tonsillectomy  AS CHILD  . Appendectomy  AGE 43  . Cryoablation N/A 10/01/2013    Procedure: CRYO ABLATION PROSTATE;  Surgeon: [name], MD;  [facility];  Service: Urology;  Laterality: N/A;  . Prostate surgery    . Right colectomy  09/07/1995  . Sigmoidectomy  11/13/95  . Total colectomy  12/29/1997    ileorectal anastomosis  . Laparotomy N/A 10/14/2013    Procedure: EXPLORATORY LAPAROTOMY;  Surgeon: [name], MD;  Location: [place];  Service: General;  Laterality: N/A;  . Bowel resection N/A 10/14/2013    Procedure: SMALL BOWEL RESECTION;  Surgeon: [name], MD;  Location: [place];  Service: General;  Laterality: N/A;  . Lysis of adhesion N/A 10/14/2013    Procedure: LYSIS OF ADHESION;  Surgeon: [name], MD;  Location: [place];  Service: General;  Laterality: N/A;  . 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 Cystoscopy with retrograde pyelogram, ureteroscopy and stent placement Right 12/14/2013    Procedure: CYSTOSCOPY WITH RIGHT RETROGRADE PYELOGRAM / INSERTION RIGHT URETERAL STENT;  Surgeon: [name], MD;  Location: [facility];  Service: Urology;  Laterality: Right;    has Colon cancer; Prostate cancer; Protein-calorie malnutrition, severe; Small bowel tumor; Accelerated hypertension; Type II diabetes mellitus; Unspecified hypothyroidism; Small bowel carcinoma; Lynch syndrome; Retroperitoneal liposarcoma; Sarcoma of retroperitoneum; Diarrhea; Anorexia; Nausea alone; Neck pain; Renal insufficiency; Hyperkalemia; Hyperphosphatemia; Transaminitis; Hypoalbuminemia; Leukocytosis, unspecified; Abdominal  pain, unspecified site; and Ascites on his problem list.     has No Known Allergies.    Medication List       This list is accurate as of: 05/27/14 11:59 PM.  Always use your most recent med list.               ADULT GUMMY Chew  Chew 2 capsules by mouth daily.     amLODipine-benazepril 10-20 MG per capsule  Commonly known as:  LOTREL  Take 1 capsule by mouth daily.     diphenoxylate-atropine 2.5-0.025 MG per tablet  Commonly known as:  LOMOTIL  Take 1 tablet by mouth 4 (four) times daily as needed for diarrhea or loose stools.     ferrous sulfate 325 (65 FE) MG tablet  Take 325 mg by mouth daily with breakfast.     furosemide 20 MG tablet  Commonly known as:  LASIX  Take 20 mg by mouth daily.     glipiZIDE 5 MG tablet  Commonly known as:  GLUCOTROL  Take 5 mg by mouth daily before breakfast.     levothyroxine 50 MCG tablet  Commonly known as:  SYNTHROID, LEVOTHROID  Take 50 mcg by mouth daily before breakfast.     ondansetron 8 MG tablet  Commonly known as:  ZOFRAN  Take 1 tablet (8 mg total) by mouth every 8 (eight) hours as needed for nausea or vomiting.     oxyCODONE-acetaminophen 5-325 MG per tablet  Commonly known as:  PERCOCET/ROXICET  Take 1-2 tablets by mouth every 6 (six) hours as needed for severe pain.     sodium polystyrene 15 GM/60ML suspension  Commonly known as:  KAYEXALATE  Take 240 mLs (60 g total) by mouth daily.         PHYSICAL EXAMINATION  Blood pressure 144/70, pulse 111, temperature 96 F (35.6 C), temperature source Oral, resp. rate 18, height 5' 11"  (1.803 m), weight 190 lb 8 oz (86.41 kg).  Physical Exam  Nursing note and vitals reviewed. Constitutional: He is oriented to person, place, and time and well-developed, well-nourished, and in no distress.  HENT:  Head: Normocephalic and atraumatic.  Eyes: Conjunctivae are normal. Pupils are equal, round, and reactive to light. No scleral icterus.  Neck: Normal range of motion.    Cardiovascular: Normal rate, regular rhythm, normal heart sounds and intact distal pulses.   Pulmonary/Chest: Effort normal and breath sounds normal.  Abdominal: Bowel sounds are normal. He exhibits distension. There is no tenderness. There is no rebound and no guarding.  Musculoskeletal: Normal range of motion. He exhibits edema. He exhibits no tenderness.  Trace edema to bilateral ankles only.  Neurological: He is alert and oriented to person, place, and time. Gait normal.  Skin: Skin is warm and dry. No rash noted. No erythema.  Patient has apparent cancer skin lesion to left anterior shin area.  No evidence of infection.  Psychiatric: Affect normal.    LABORATORY DATA:.  CBC  Lab Results  Component Value Date   WBC 21.7* 05/27/2014   RBC 4.29 05/27/2014   HGB 11.0* 05/27/2014   HCT 35.2* 05/27/2014   PLT 416* 05/27/2014   MCV 81.9 05/27/2014   MCH 25.6* 05/27/2014   MCHC 31.3* 05/27/2014   RDW 18.5* 05/27/2014   LYMPHSABS 1.1 05/27/2014   MONOABS 1.2* 05/27/2014   EOSABS 0.1 05/27/2014   BASOSABS 0.0 05/27/2014     CMET  Lab Results  Component Value Date   NA 126* 05/27/2014   K 5.0 05/27/2014   CL 95* 05/26/2014   CO2 22 05/27/2014   GLUCOSE 211* 05/27/2014   BUN 32.7* 05/27/2014   CREATININE 1.8* 05/27/2014   CALCIUM 8.5 05/27/2014   PROT 6.8 05/24/2014   ALBUMIN 2.2* 05/24/2014   AST 95* 05/24/2014   ALT 16 05/24/2014   ALKPHOS 411* 05/24/2014   BILITOT 0.44 05/24/2014   GFRNONAA 33* 05/26/2014   GFRAA 38* 05/26/2014      RADIOGRAPHIC STUDIES:  ASSESSMENT/PLAN:    Colon cancer  Assessment & Plan Patient completed his third paracentesis on 05/25/2014; in which they withdrew a little over 5 L of fluid.  Patient states that he feels much relieved following the removal of the fluid; and isn't in any specific pain at this time.  He is scheduled for a liver MRI first thing tomorrow morning 05/28/2014.  Advised patient that we will call and follow up with him on Monday, 05/30/2014  regards possible liver biopsy plans.   Renal insufficiency  Assessment & Plan Creatinine has improved to 1.8.  Patient has a renal consult scheduled for Tuesday, 06/07/2014.   Hyperkalemia  Assessment & Plan Potassium has improved as well to 5.0.  Advised patient that he could decrease his Kayexalate to 60 g x1 more day; and then discontinue.   Ascites  Assessment & Plan Patient's last paracentesis was 05/25/2014.  He had a little over 5 L of fluid drawn off at that time.  Patient states that he does not feel 2 very distended at this time.  He may very well require future paracentesis as well.  Also, patient has a left anterior shin skin cancer that he is wanting to have removed.  He previously had a dermatologist appointment for removal of the skin cancer; but states that he will hold with her dermatologist 2 he has obtained all scans and biopsies needed.  Assured patient that we would assist him in arranging a followup dermatology appointment for this skin cancer lesion removal prior to initiation of any chemotherapy.   Patient stated understanding of all instructions; and was in agreement with this plan of care. The patient knows to call the clinic with any problems, questions or concerns.   This was a shared visit with Dr. Benay Spice today.  Total time spent with patient was 40 minutes;  with greater than 75 percent of that time spent in face to face counseling regarding his symptoms, and coordination of care and follow up.  Disclaimer: This note was dictated with voice recognition software. Similar sounding words can inadvertently be transcribed and may not be corrected upon review.   Drue Second, NP 05/30/2014  We discussed the plan for a liver MRI to evaluate for metastases and portal vein occlusion.  We discussed the CT biopsy result.  He will be scheduled for a liver biopsy pending the MRI result.  Julieanne Manson, MD

## 2014-05-30 NOTE — Assessment & Plan Note (Signed)
Patient completed his third paracentesis on 05/25/2014; in which they withdrew a little over 5 L of fluid.  Patient states that he feels much relieved following the removal of the fluid; and isn't in any specific pain at this time.  He is scheduled for a liver MRI first thing tomorrow morning 05/28/2014.  Advised patient that we will call and follow up with him on Monday, 05/30/2014 regards possible liver biopsy plans.

## 2014-05-30 NOTE — Telephone Encounter (Signed)
RECEIVED A FAX FROM [facility] CONCERNING A PRIOR AUTHORIZATION FOR ONDANSETRON. THIS REQUEST WAS PLACED IN THE MANAGED CARE BIN. 

## 2014-05-30 NOTE — Telephone Encounter (Signed)
Patient called very concerned that his handwritten note from nurse last week had him to see renal on 06/07/14 at 1:15 pm. He was told by NP that he would be seen tomorrow on 10/6-and he cancelled a prior appointment to accommodate this. Feels he should not wait until next week to be seen.  Also requesting a call regarding his MRI results. Confirmed with [facility] that his appointment is on 10/13 at 1:15 pm with arrival at 1:00. Will be seeing Dr. Joelyn Oms. Was told there are no openings for tomorrow.

## 2014-05-30 NOTE — Telephone Encounter (Signed)
per 10/5 staff message from desk nurse (susan) 10/5 pof from CB disregarded. per susan pt does not need to come in today. per pof lab to be drawn when pt has liver bx and bx will be order after BS s/w IR. no appts scheduled per according to this pof.

## 2014-05-30 NOTE — Telephone Encounter (Signed)
Called patient this afternoon to review liver MRI results.  Patient states that he continues to feel fairly well.  He states that he has minimal abdominal discomfort or distention.  Advised patient that will schedule repeat labs and liver biopsy for this and as possible.  Advised patient that we will be calling him within the next day to schedule all procedures.  Patient stated understanding of all instructions.

## 2014-05-30 NOTE — Assessment & Plan Note (Signed)
Patient's last paracentesis was 05/25/2014.  He had a little over 5 L of fluid drawn off at that time.  Patient states that he does not feel 2 very distended at this time.  He may very well require future paracentesis as well.  Also, patient has a left anterior shin skin cancer that he is wanting to have removed.  He previously had a dermatologist appointment for removal of the skin cancer; but states that he will hold with her dermatologist 2 he has obtained all scans and biopsies needed.  Assured patient that we would assist him in arranging a followup dermatology appointment for this skin cancer lesion removal prior to initiation of any chemotherapy.

## 2014-05-31 ENCOUNTER — Ambulatory Visit (HOSPITAL_COMMUNITY)
Admission: RE | Admit: 2014-05-31 | Discharge: 2014-05-31 | Disposition: A | Discharge status: Home / Self Care | Payer: Medicare Other | Source: Ambulatory Visit | Attending: Oncology | Admitting: Oncology

## 2014-05-31 ENCOUNTER — Ambulatory Visit (HOSPITAL_COMMUNITY)
Admission: RE | Admit: 2014-05-31 | Discharge: 2014-05-31 | Disposition: A | Discharge status: Home / Self Care | Payer: Medicare Other | Source: Ambulatory Visit | Attending: Radiology | Admitting: Radiology

## 2014-05-31 ENCOUNTER — Other Ambulatory Visit: Payer: Self-pay | Admitting: Oncology

## 2014-05-31 ENCOUNTER — Other Ambulatory Visit: Payer: Self-pay | Admitting: *Deleted

## 2014-05-31 ENCOUNTER — Telehealth: Payer: Self-pay | Admitting: *Deleted

## 2014-05-31 ENCOUNTER — Other Ambulatory Visit: Payer: Medicare Other

## 2014-05-31 ENCOUNTER — Encounter (HOSPITAL_COMMUNITY): Payer: Self-pay

## 2014-05-31 DIAGNOSIS — C179 Malignant neoplasm of small intestine, unspecified: Secondary | ICD-10-CM | POA: Diagnosis not present

## 2014-05-31 DIAGNOSIS — I81 Portal vein thrombosis: Secondary | ICD-10-CM | POA: Diagnosis not present

## 2014-05-31 DIAGNOSIS — K769 Liver disease, unspecified: Secondary | ICD-10-CM | POA: Diagnosis present

## 2014-05-31 DIAGNOSIS — C61 Malignant neoplasm of prostate: Secondary | ICD-10-CM | POA: Insufficient documentation

## 2014-05-31 DIAGNOSIS — C189 Malignant neoplasm of colon, unspecified: Secondary | ICD-10-CM

## 2014-05-31 DIAGNOSIS — Z1509 Genetic susceptibility to other malignant neoplasm: Secondary | ICD-10-CM | POA: Insufficient documentation

## 2014-05-31 DIAGNOSIS — C499 Malignant neoplasm of connective and soft tissue, unspecified: Secondary | ICD-10-CM | POA: Diagnosis not present

## 2014-05-31 DIAGNOSIS — R188 Other ascites: Secondary | ICD-10-CM | POA: Insufficient documentation

## 2014-05-31 LAB — BASIC METABOLIC PANEL
Anion gap: 16 — ABNORMAL HIGH (ref 5–15)
BUN: 40 mg/dL — AB (ref 6–23)
CO2: 18 meq/L — AB (ref 19–32)
CREATININE: 1.99 mg/dL — AB (ref 0.50–1.35)
Calcium: 8.5 mg/dL (ref 8.4–10.5)
Chloride: 89 mEq/L — ABNORMAL LOW (ref 96–112)
GFR calc Af Amer: 38 mL/min — ABNORMAL LOW (ref >90)
GFR calc non Af Amer: 33 mL/min — ABNORMAL LOW (ref >90)
Glucose, Bld: 245 mg/dL — ABNORMAL HIGH (ref 70–99)
Potassium: 5.1 mEq/L (ref 3.7–5.3)
Sodium: 123 mEq/L — ABNORMAL LOW (ref 137–147)

## 2014-05-31 LAB — CBC WITH DIFFERENTIAL/PLATELET
Basophils Absolute: 0.1 10*3/uL (ref 0.0–0.1)
Basophils Relative: 0 % (ref 0–1)
Eosinophils Absolute: 0.1 10*3/uL (ref 0.0–0.7)
Eosinophils Relative: 1 % (ref 0–5)
HEMATOCRIT: 32.7 % — AB (ref 39.0–52.0)
Hemoglobin: 10.8 g/dL — ABNORMAL LOW (ref 13.0–17.0)
LYMPHS PCT: 5 % — AB (ref 12–46)
Lymphs Abs: 1 10*3/uL (ref 0.7–4.0)
MCH: 26.3 pg (ref 26.0–34.0)
MCHC: 33 g/dL (ref 30.0–36.0)
MCV: 79.8 fL (ref 78.0–100.0)
MONO ABS: 1.2 10*3/uL — AB (ref 0.1–1.0)
MONOS PCT: 6 % (ref 3–12)
Neutro Abs: 18 10*3/uL — ABNORMAL HIGH (ref 1.7–7.7)
Neutrophils Relative %: 88 % — ABNORMAL HIGH (ref 43–77)
Platelets: 349 10*3/uL (ref 150–400)
RBC: 4.1 MIL/uL — ABNORMAL LOW (ref 4.22–5.81)
RDW: 17.2 % — ABNORMAL HIGH (ref 11.5–15.5)
WBC: 20.4 10*3/uL — AB (ref 4.0–10.5)

## 2014-05-31 LAB — PROTIME-INR
INR: 1.32 (ref 0.00–1.49)
Prothrombin Time: 16.5 seconds — ABNORMAL HIGH (ref 11.6–15.2)

## 2014-05-31 MED ORDER — FENTANYL CITRATE 0.05 MG/ML IJ SOLN
INTRAMUSCULAR | Status: AC | PRN
  Administered 2014-05-31: 100 ug via INTRAVENOUS

## 2014-05-31 MED ORDER — HYDROCODONE-ACETAMINOPHEN 5-325 MG PO TABS
1.0000 | ORAL_TABLET | ORAL | Status: DC | PRN
  Filled 2014-05-31: qty 2

## 2014-05-31 MED ORDER — MIDAZOLAM HCL 2 MG/2ML IJ SOLN
INTRAMUSCULAR | Status: AC | PRN
  Administered 2014-05-31: 2 mg via INTRAVENOUS

## 2014-05-31 MED ORDER — OXYCODONE-ACETAMINOPHEN 5-325 MG PO TABS: 1.0000 | ORAL_TABLET | Freq: Four times a day (QID) | ORAL | Status: DC | PRN

## 2014-05-31 MED ORDER — FENTANYL CITRATE 0.05 MG/ML IJ SOLN
INTRAMUSCULAR | Status: AC
  Filled 2014-05-31: qty 2

## 2014-05-31 MED ORDER — MIDAZOLAM HCL 2 MG/2ML IJ SOLN
INTRAMUSCULAR | Status: AC
  Filled 2014-05-31: qty 2

## 2014-05-31 MED ORDER — SODIUM CHLORIDE 0.9 % IV SOLN
INTRAVENOUS | Status: DC
  Administered 2014-05-31: 500 mL via INTRAVENOUS

## 2014-05-31 NOTE — Telephone Encounter (Signed)
Call from [place]. Pt has an appt with Dr. Joelyn Oms on 10/13 at 1:15.

## 2014-05-31 NOTE — Discharge Instructions (Signed)
[facility] Biopsy, Care After These instructions give you information on caring for yourself after your procedure. Your doctor may also give you more specific instructions. Call your doctor if you have any problems or questions after your procedure. HOME CARE  Rest at home for 1-2 days or as told by your doctor.  Have someone stay with you for at least 24 hours.  Do not do these things in the first 24 hours:  Drive.  Use machinery.  Take care of other people.  Sign legal documents.  Take a bath or shower.  There are many different ways to close and cover a cut (incision). For example, a cut can be closed with stitches, skin glue, or adhesive strips. Follow your doctor's instructions on:  Taking care of your cut.  Changing and removing your bandage (dressing).  Removing whatever was used to close your cut.  Do not drink alcohol in the first week.  Do not lift more than 5 pounds or play contact sports for the first 2 weeks.  Take medicines only as told by your doctor. For 1 week, do not take medicine that has aspirin in it or medicines like ibuprofen.  Get your test results. GET HELP IF:  A cut bleeds and leaves more than just a small spot of blood.  A cut is red, puffs up (swells), or hurts more than before.  Fluid or something else comes from a cut.  A cut smells bad.  You have a fever or chills. GET HELP RIGHT AWAY IF:  You have swelling, bloating, or pain in your belly (abdomen).  You get dizzy or faint.  You have a rash.  You feel sick to your stomach (nauseous) or throw up (vomit).  You have trouble breathing, feel short of breath, or feel faint.  Your chest hurts.  You have problems talking or seeing.  You have trouble balancing or moving your arms or legs. Document Released: 05/21/2008 Document Revised: 12/27/2013 Document Reviewed: 10/08/2013 [facility] Patient Information 2015 [place], [place]. This information is not intended to replace advice given  to you by your health care provider. Make sure you discuss any questions you have with your health care provide Paracentesis Paracentesis is a procedure used to remove excess fluid from the belly (abdomen). Excess fluid in the belly is called ascites. Excess fluid can be the result of certain conditions, such as infection, inflammation, abdominal injury, heart failure, chronic scarring of the liver (cirrhosis), or cancer. The excess fluid is removed using a needle inserted through the skin and tissue into the abdomen.  A paracentesis may be done to:  Determine the cause of the excess fluid through examination of the fluid.  Relieve symptoms of shortness of breath or pain caused by the excess fluid.  Determine presence of bleeding after an abdominal injury. LET YOUR CAREGIVERS KNOW ABOUT:  Allergies.  Medications taken including herbs, eye drops, over-the-counter medications, and creams.  Use of steroids (by mouth or creams).  Previous problems with anesthetics or numbing medicine.  Possibility of pregnancy, if this applies.  History of blood clots (thrombophlebitis).  History of bleeding or blood problems.  Previous surgery.  Other health problems. RISKS AND COMPLICATIONS  Injury to an abdominal organ, such as the bowel (large intestine), liver, spleen, or bladder.  Possible infection.  Bleeding.  Low blood pressure (hypotension). BEFORE THE PROCEDURE This is a procedure that can be done as an outpatient. Confirm the time that you need to arrive for your procedure. A blood sample  may be done to determine your blood clotting time. The presence of a severe bleeding disorder (coagulopathy) which cannot be promptly corrected may make this procedure inadvisable. You may be asked to urinate. PROCEDURE The procedure will take about 30 minutes. This time will vary depending on the amount of fluid that is removed. You may be asked to lie on your back with your head elevated. An area  on your abdomen will be cleansed. A numbing medicine may then be injected (local anesthesia) into the skin and tissue. A needle is inserted through your abdominal skin and tissues until it is positioned in your abdomen. You may feel pressure or slight pain as the needle is positioned into the abdomen. Fluid is removed from the abdomen through the needle. Tell your caregiver if you feel dizzy or lightheaded. The needle is withdrawn once the desired amount of fluid has been removed. A sample of the fluid may be sent for examination.  AFTER THE PROCEDURE Your recovery will be assessed and monitored. If there are no problems, as an outpatient, you should be able to go home shortly after the procedure. There may be a very limited amount of clear fluid draining from the needle insertion site over the next 2 days. Confirm with your caregiver as to the expected amount of drainage. Obtaining the Test Results It is your responsibility to obtain your test results. Do not assume everything is normal if you have not heard from your caregiver or the medical facility. It is important for you to follow up on all of your test results. HOME CARE INSTRUCTIONS   You may resume normal diet and activities as directed or allowed.  Only take over-the-counter or prescription medicines for pain, discomfort, or fever as directed by your caregiver. SEEK IMMEDIATE MEDICAL CARE IF:  You develop shortness of breath or chest pain.  You develop increasing pain, discomfort, or swelling in your abdomen.  You develop new drainage or pus coming from site where fluid was removed.  You develop swelling or increased redness from site where fluid was removed.  You develop an unexplained temperature of 102 F (38.9 C) or above. Document Released: 02/25/2005 Document Revised: 11/04/2011 Document Reviewed: 04/03/2009 [facility] Patient Information 2015 [place], [place]. This information is not intended to replace advice given to you by  your health care provider. Make sure you discuss any questions you have with your health care provider. Conscious Sedation Sedation is the use of medicines to promote relaxation and relieve discomfort and anxiety. Conscious sedation is a type of sedation. Under conscious sedation you are less alert than normal but are still able to respond to instructions or stimulation. Conscious sedation is used during short medical and dental procedures. It is milder than deep sedation or general anesthesia and allows you to return to your regular activities sooner.  LET [facility] CARE PROVIDER KNOW ABOUT:   Any allergies you have.  All medicines you are taking, including vitamins, herbs, eye drops, creams, and over-the-counter medicines.  Use of steroids (by mouth or creams).  Previous problems you or members of your family have had with the use of anesthetics.  Any blood disorders you have.  Previous surgeries you have had.  Medical conditions you have.  Possibility of pregnancy, if this applies.  Use of cigarettes, alcohol, or illegal drugs. RISKS AND COMPLICATIONS Generally, this is a safe procedure. However, as with any procedure, problems can occur. Possible problems include:  Oversedation.  Trouble breathing on your own. You may need  to have a breathing tube until you are awake and breathing on your own.  Allergic reaction to any of the medicines used for the procedure. BEFORE THE PROCEDURE  You may have blood tests done. These tests can help show how well your kidneys and liver are working. They can also show how well your blood clots.  A physical exam will be done.  Only take medicines as directed by your health care provider. You may need to stop taking medicines (such as blood thinners, aspirin, or nonsteroidal anti-inflammatory drugs) before the procedure.   Do not eat or drink at least 6 hours before the procedure or as directed by your health care provider.  Arrange for a  responsible adult, family member, or friend to take you home after the procedure. He or she should stay with you for at least 24 hours after the procedure, until the medicine has worn off. PROCEDURE   An intravenous (IV) catheter will be inserted into one of your veins. Medicine will be able to flow directly into your body through this catheter. You may be given medicine through this tube to help prevent pain and help you relax.  The medical or dental procedure will be done. AFTER THE PROCEDURE  You will stay in a recovery area until the medicine has worn off. Your blood pressure and pulse will be checked.   Depending on the procedure you had, you may be allowed to go home when you can tolerate liquids and your pain is under control. Document Released: 05/07/2001 Document Revised: 08/17/2013 Document Reviewed: 04/19/2013 [facility] Patient Information 2015 [place], [place]. This information is not intended to replace advice given to you by your health care provider. Make sure you discuss any questions you have with your health care provider.

## 2014-05-31 NOTE — Procedures (Signed)
US guided therapeutic paracentesis performed yielding 4.8 liters amber fluid. No immediate complications.

## 2014-05-31 NOTE — Telephone Encounter (Signed)
Wife called and left message that patient needs more Percocet.  They are here in the hospital getting biopsy and dopppler.  Discussed with [facility] and rx printed and signed.  Wife called and notified that script is ready for her to pick up.  She will come now and bring photo ID for pick up

## 2014-05-31 NOTE — H&P (Signed)
Chief Complaint: "I'm getting a liver biopsy"  Referring Physician(s): Sherrill,Gary B  History of Present Illness: Dean Owens is a 68 y.o. male with history of Lynch syndrome and prior squamous cell skin cancer, prostate cancer, SB cancer and liposarcoma as well as recurrent ascites. He underwent a right abdominal mass biopsy by our service on 10/1 which revealed stromal fibrosis but no malignancy. MRI abdomen on 10/3 revealed large volume infiltrative tumor/tumors throughout the right lobe of the liver with involvement of the portal vein and porta hepatis adenopathy. Also present was extensive abdominal/retrocrural adenopathy.He presents today for hepatic venous doppler study in addition to paracentesis and liver lesion biopsy.    Past Medical History  Diagnosis Date  . Hypertension   . Type 2 diabetes mellitus   . Hypothyroidism   . History of colon cancer NO RECURRENCE    S/P COLECTOMY X3  LAST ONE 1998--  [place]  . ED (erectile dysfunction) of organic origin   . Right knee DJD   . Eczema   . OA (osteoarthritis)     LEFT KNEE  . Wears glasses   . History of iron deficiency anemia   . History of squamous cell carcinoma excision   . Accelerated hypertension 10/21/2013  . Type II diabetes mellitus 10/21/2013  . Unspecified hypothyroidism 10/21/2013  . 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 Lynch syndrome   . History of nonmelanoma skin cancer   . Anemia   . Recurrent prostate carcinoma FOLLOWED BY DR Gaynelle Arabian    DX 2007  S/P RADIOACTIVE SEED IMPLANTS  . 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 Ileus     s/p op 3 days  . History of blood transfusion 09/2013    PRBC  . S/P radiation therapy 01/19/14-03/01/14    retroperitonela sarcoma/abdomen    Past Surgical History  Procedure Laterality Date  . Givens capsule study N/A 07/05/2013    Procedure: GIVENS CAPSULE STUDY;  Surgeon: [name], MD;  Location: [place];  Service:  Endoscopy;  Laterality: N/A;  . Radioactive prostate seed implants  OCT 2007  . Knee arthroscopy Bilateral 2006 &  2007  . Tonsillectomy  AS CHILD  . Appendectomy  AGE 36  . Cryoablation N/A 10/01/2013    Procedure: CRYO ABLATION PROSTATE;  Surgeon: [name], MD;  Location: [facility];  Service: Urology;  Laterality: N/A;  . Prostate surgery    . Right colectomy  09/07/1995  . Sigmoidectomy  11/13/95  . Total colectomy  12/29/1997    ileorectal anastomosis  . Laparotomy N/A 10/14/2013    Procedure: EXPLORATORY LAPAROTOMY;  Surgeon: [name], MD;  Location: [place];  Service: General;  Laterality: N/A;  . Bowel resection N/A 10/14/2013    Procedure: SMALL BOWEL RESECTION;  Surgeon: [name], MD;  Location: [place];  Service: General;  Laterality: N/A;  . Lysis of adhesion N/A 10/14/2013    Procedure: LYSIS OF ADHESION;  Surgeon: [name], MD;  Location: [place];  Service: General;  Laterality: N/A;  . 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 Cystoscopy with retrograde pyelogram, ureteroscopy and stent placement Right 12/14/2013    Procedure: CYSTOSCOPY WITH RIGHT RETROGRADE PYELOGRAM / [place];  Surgeon: [name], MD;  Location: [facility];  Service: Urology;  Laterality: Right;    Allergies: Review of patient's allergies indicates no known allergies.  Medications: Prior to Admission medications   Medication Sig Start Date End Date Taking? Authorizing Provider  amLODipine-benazepril (LOTREL) 10-20 MG per capsule Take 1 capsule by mouth daily.   Yes Historical Provider, MD  diphenoxylate-atropine (LOMOTIL) 2.5-0.025 MG per tablet Take 1 tablet by mouth 4 (four) times daily as needed for diarrhea or loose stools.   Yes Historical Provider, MD  ferrous sulfate 325 (65 FE) MG tablet Take 325  mg by mouth daily with breakfast.  11/18/13  Yes Historical Provider, MD  furosemide (LASIX) 20 MG tablet Take 20 mg by mouth daily.   Yes Historical Provider, MD  glipiZIDE (GLUCOTROL) 5 MG tablet Take 5 mg by mouth daily before breakfast.   Yes Historical Provider, MD  levothyroxine (SYNTHROID, LEVOTHROID) 50 MCG tablet Take 50 mcg by mouth daily before breakfast.   Yes Historical Provider, MD  Multiple Vitamins-Minerals (ADULT GUMMY) CHEW Chew 2 capsules by mouth daily.   Yes Historical Provider, MD  ondansetron (ZOFRAN) 8 MG tablet Take 1 tablet (8 mg total) by mouth every 8 (eight) hours as needed for nausea or vomiting. 05/27/14  Yes Drue Second, NP  oxyCODONE-acetaminophen (PERCOCET/ROXICET) 5-325 MG per tablet Take 1-2 tablets by mouth every 6 (six) hours as needed for severe pain.   Yes Historical Provider, MD  sodium polystyrene (KAYEXALATE) 15 GM/60ML suspension Take 60 g by mouth See admin instructions. 60-232ms as advised by doctor. 05/26/14  Yes CDrue Second NP    Family History  Problem Relation Age of Onset  . Heart failure Mother   . Heart failure Father   . Ovarian cancer Maternal Aunt   . Uterine cancer Paternal Aunt 71  . Colon cancer Paternal Aunt 87  . Cancer Paternal Aunt 839   sebaceous adenoma; Muir-Torre syndrome  . Colon cancer Paternal Grandmother   . Colon cancer Brother 572 . Colon cancer Cousin 578 . Uterine cancer Cousin 526 . Colon polyps Cousin   . Brain cancer Cousin 17    astrocytoma; Mary's daughter  . Colon polyps Cousin   . Lung cancer Cousin 610 . Breast cancer Cousin 634 . Uterine cancer Cousin 466 . Colon cancer Cousin 554   History   Social History  . Marital Status: Married    Spouse Name: CJenny Reichmann   Number of Children: 3  . Years of Education: N/A   Occupational History  .     Social History Main Topics  . Smoking status: Former Smoker -- 0.50 packs/day for 15 years    Types: Cigarettes    Quit date: 09/07/2013  . Smokeless  tobacco: Never Used  . Alcohol Use: Yes     Comment: OCCASIONAL beer  . Drug Use: No  . Sexual Activity: Yes   Other Topics Concern  . None   Social History Narrative   Married, wife CCaren Griffinsfor 40+ years   Mortgage industry-employed by [employer]  # 3 children-#2 girls and #1 boy   #4 grandchildren (boys)   No pets or hobbies (used to play golf)         Review of Systems   Constitutional: Positive for fatigue. Negative for fever and chills.  Respiratory: Positive for shortness of breath.        Occ cough  Cardiovascular: Negative for chest pain.  Gastrointestinal: Positive for nausea, abdominal pain and abdominal distention.  Negative for vomiting and blood in stool.  Genitourinary: Negative for dysuria and hematuria.  Musculoskeletal:       Occ back pain  Neurological: Negative for headaches.  Hematological: Does not bruise/bleed easily.  Psychiatric/Behavioral: The patient is nervous/anxious.     Vital Signs: BP 148/80  Pulse 119  Temp(Src) 97.8 F (36.6 C) (Oral)  Resp 16  SpO2 100%  Physical Exam  Constitutional: He is oriented to person, place, and time. He appears well-developed and well-nourished.  Cardiovascular:  Tachy but regular  Pulmonary/Chest: Effort normal.  Sl dim BS rt base, left clear  Abdominal: Soft. Bowel sounds are normal. He exhibits distension. There is tenderness.  Musculoskeletal: Normal range of motion. He exhibits edema.  Neurological: He is alert and oriented to person, place, and time.    Imaging: Ct Abdomen Pelvis Wo Contrast  05/27/2014   ADDENDUM REPORT: 05/27/2014 10:25  ADDENDUM: It should be noted that there is decreased attenuation of the right hepatic lobe present. The portal vein is also prominent. To excluded hepatic disease and/or portal vein thrombosis further evaluation of the liver and portal vein with MRI of the liver should be considered. These results were called by telephone at the time of interpretation on  05/27/2014 at 10:22 am to Dr. Drue Second , who verbally acknowledged these results.   Electronically Signed   By: Marcello Moores  Register   On: 05/27/2014 10:25   05/27/2014   CLINICAL DATA:  History of colon cancer and liposarcoma. Increasing abdominal pain ascites. Preprocedural CT for CT guided biopsy .  EXAM: CT ABDOMEN AND PELVIS WITHOUT CONTRAST  TECHNIQUE: Multidetector CT imaging of the abdomen and pelvis was performed following the standard protocol without IV contrast.  COMPARISON:  CT 05/17/2014, 10/26/2013  FINDINGS: No focal new hepatic abnormality. Stable focal is seen of the right lobe of the liver. This may be vascular. Similar finding at the dome of the liver. Spleen is normal. Pancreas is normal. Calcifications noted in the pancreas. No biliary distention. The gallbladder is nondistended.  The adrenals are unremarkable. The kidneys are unremarkable. No evidence of obstructing ureteral stone. The bladder is nondistended. Prostate seeds are noted.  Multiple retroperitoneal/retrocrural lymph nodes noted. Lymph nodes are stable from prior recent CT of 05/17/2014. Lymph nodes measure up to 1.7 cm. Periportal adenopathy is again noted. Multiple mesenteric nodular densities are noted consistent with peritoneal metastatic disease. Previously identified ascites again noted. The bladder of ascites is diminished on today's exam. Retroperitoneal fluid collection noted adjacent to the psoas is unchanged in size and may represent a seroma from prior surgery as previously described. Multiple clips are noted throughout the abdomen.  Stool is present throughout the colon. Postsurgical changes noted along left colon. There is no bowel distention. The stomach is nondistended. No free air.  Coronary artery disease. Mild changes of pleural-parenchymal scarring in the lung bases. No acute bony abnormality. Degenerative changes lumbar spine and both hips. No focal bony lesions.  IMPRESSION: 1. Previously identified ascites  is decreased slightly. Ascites persists. Multiple mesenteric nodular density is noted consistent with peritoneal metastatic disease. 2. Multiple stable retroperitoneal/retrocrural and periportal lymph nodes again noted. 3. Stable fluid collection adjacent to the right psoas at site of prior surgery, most likely postoperative seroma, no change from prior recent study. 4. Postsurgical changes noted in the left colon. Prostate seeds are noted. 5. Coronary artery disease.  Electronically Signed: ByMarcello Moores  Register On: 05/26/2014 10:58   Ct Abdomen Pelvis Wo Contrast  05/17/2014  CLINICAL DATA:  Colon cancer.  Abdominal distention.  EXAM: CT CHEST, ABDOMEN AND PELVIS WITHOUT CONTRAST  TECHNIQUE: Multidetector CT imaging of the chest, abdomen and pelvis was performed following the standard protocol without IV contrast.  COMPARISON:  11/15/2013  FINDINGS: CT CHEST FINDINGS  Chest wall:  No supraclavicular or axillary mass or adenopathy. The thyroid gland is unremarkable. The bony thorax is intact. Moderate degenerative changes involving the thoracic spine. Remote healed left rib fractures are noted.  Mediastinum: The heart is normal in size. No pericardial effusion. Coronary artery calcifications are noted. There is mild tortuosity, ectasia and calcification of the thoracic aorta but no focal aneurysm. The esophagus is grossly normal.  Lungs:  No worrisome pulmonary lesions. There is right lower lobe scarring change. No infiltrates or edema. There is a small right pleural effusion and minimal overlying atelectasis.  CT ABDOMEN AND PELVIS FINDINGS  There is large volume ascites surrounding the liver and spleen and continuing down into the pericolic gutters and mesenteric. No obvious hepatic lesions. The spleen is normal in size. The pancreas is grossly normal. Stable calcifications in the pancreatic head. The adrenal glands and kidneys are grossly normal without contrast. There are enlarged gastrohepatic ligament,  subdiaphragmatic, celiac axis, mesenteric and retroperitoneal lymph nodes which are progressive when compared to prior studies. There is also extensive mesenteric and omental nodularity consistent with peritoneal carcinomatosis. Retrocrural adenopathy is noted.  There are surgical changes in the right lower quadrant from a previous soft tissue mass resection. There is a residual cystic structure anterior to the right psoas muscle which is most likely a postoperative seroma. A small bowel resection is noted in the left lower quadrant/pelvis. No complicating features. Peritoneal implants are noted in the pelvis.  The stomach, duodenum, small bowel and colon are grossly normal. No obstructive findings. The bladder, prostate gland and seminal vesicles are unremarkable. Prostate seeds are noted. The aorta demonstrates stable advanced atherosclerotic calcifications.  IMPRESSION: 1. Stable CT appearance of the chest. No findings for metastatic disease. 2. Interval development of large volume abdominal ascites and diffuse peritoneal carcinomatosis and lymphadenopathy. 3. Small right pleural effusion and overlying atelectasis.   Electronically Signed   By: Kalman Jewels M.D.   On: 05/17/2014 17:08   Dg Abd 1 View  05/16/2014   CLINICAL DATA:  History colon cancer.  Abdominal pain.  EXAM: ABDOMEN - 1 VIEW  COMPARISON:  12/20/2013.  FINDINGS: Surgical clips are noted throughout the abdomen and pelvis. Increased density is noted throughout the abdomen. These findings are consistent with ascites. There is no bowel distention. No free air is identified. No acute bony abnormalities identified.  IMPRESSION: 1. Findings consistent with ascites. This can be confirmed with ultrasound. 2. Surgical clips noted throughout the abdomen and pelvis. No evidence of bowel distention or free air.   Electronically Signed   By: Marcello Moores  Register   On: 05/16/2014 12:44   Ct Chest Wo Contrast  05/17/2014   CLINICAL DATA:  Colon cancer.   Abdominal distention.  EXAM: CT CHEST, ABDOMEN AND PELVIS WITHOUT CONTRAST  TECHNIQUE: Multidetector CT imaging of the chest, abdomen and pelvis was performed following the standard protocol without IV contrast.  COMPARISON:  11/15/2013  FINDINGS: CT CHEST FINDINGS  Chest wall:  No supraclavicular or axillary mass or adenopathy. The thyroid gland is unremarkable. The bony thorax is intact. Moderate degenerative changes involving the thoracic spine. Remote healed left rib fractures are noted.  Mediastinum: The heart is normal in size. No pericardial effusion.  Coronary artery calcifications are noted. There is mild tortuosity, ectasia and calcification of the thoracic aorta but no focal aneurysm. The esophagus is grossly normal.  Lungs:  No worrisome pulmonary lesions. There is right lower lobe scarring change. No infiltrates or edema. There is a small right pleural effusion and minimal overlying atelectasis.  CT ABDOMEN AND PELVIS FINDINGS  There is large volume ascites surrounding the liver and spleen and continuing down into the pericolic gutters and mesenteric. No obvious hepatic lesions. The spleen is normal in size. The pancreas is grossly normal. Stable calcifications in the pancreatic head. The adrenal glands and kidneys are grossly normal without contrast. There are enlarged gastrohepatic ligament, subdiaphragmatic, celiac axis, mesenteric and retroperitoneal lymph nodes which are progressive when compared to prior studies. There is also extensive mesenteric and omental nodularity consistent with peritoneal carcinomatosis. Retrocrural adenopathy is noted.  There are surgical changes in the right lower quadrant from a previous soft tissue mass resection. There is a residual cystic structure anterior to the right psoas muscle which is most likely a postoperative seroma. A small bowel resection is noted in the left lower quadrant/pelvis. No complicating features. Peritoneal implants are noted in the pelvis.  The  stomach, duodenum, small bowel and colon are grossly normal. No obstructive findings. The bladder, prostate gland and seminal vesicles are unremarkable. Prostate seeds are noted. The aorta demonstrates stable advanced atherosclerotic calcifications.  IMPRESSION: 1. Stable CT appearance of the chest. No findings for metastatic disease. 2. Interval development of large volume abdominal ascites and diffuse peritoneal carcinomatosis and lymphadenopathy. 3. Small right pleural effusion and overlying atelectasis.   Electronically Signed   By: Kalman Jewels M.D.   On: 05/17/2014 17:08   Mr Liver Wo Contrast  05/28/2014   CLINICAL DATA:  abdominal surgeries 6 weeks ago. Colon cancer. Renal insufficiency. Ascites. Evaluate portal vein. History of colon cancer and liposarcoma.  EXAM: MRI ABDOMEN WITHOUT CONTRAST  TECHNIQUE: Multiplanar multisequence MR imaging was performed without the administration of intravenous contrast.  COMPARISON:  CT of 05/26/2014 and CT biopsy of 05/26/2014.  FINDINGS: Lower chest: Normal heart size without pericardial or pleural effusion.  Hepatobiliary: Infiltrative masses throughout the right lobe of the liver. These are difficult to measure as separate lesions, given their infiltrative nature. Index component at the hepatic dome measures 13.3 x 10.7 cm on image 13 of series 3. More inferior portion in the anterior and posterior segments of the right lobe measures 9.9 x 9.3 cm on image 24 of series 3. Confluent tumor and adjacent adenopathy identified in the expected location of the portal vein. Example mass at 3.1 x 4.3 cm on image 27 of series 3. The superior mesenteric vein and splenic vein appear relatively uninvolved with tumor. Better seen on recent CT.  Incompletely distended gallbladder. No stones or biliary ductal dilatation.  Pancreas: Grossly within normal limits.  Spleen: Possible sub cm T2 hyperintense focus inferiorly on image 31 of series 3. Of doubtful clinical significance.   Adrenals/Urinary Tract: Normal adrenal glands. Bilateral renal cysts. Subcentimeter focus of T1 hyperintensity in the left kidney on image 55 of series 8 May relate to an underlying hemorrhagic cyst. Also of doubtful clinical significance.  Stomach/Bowel: Normal stomach, without wall thickening. Normal caliber of large and small bowel loops.  Vascular/Lymphatic: Portal vein involvement by tumor, as detailed above. Normal caliber of the abdominal aorta.  Extensive retroperitoneal and porta hepatis adenopathy. Example right pericaval 1.6 cm node on image 95 of series 5. Portal caval node  of 3.3 x 2.6 cm on image 100 of series 5. Extensive retrocrural adenopathy, within measuring 12 mm on image 20 of series 7.  Other:  Moderate volume ascites.  Musculoskeletal: No acute osseous abnormality.  Exam degraded by mild motion and lack of IV contrast.  IMPRESSION: 1. Large volume infiltrative tumor/tumors throughout the right lobe of the liver. Given the clinical history, presumably related to metastatic disease. Multifocal hepatocellular carcinoma could look similar. 2. Involvement of the portal vein by hepatic tumor and adjacent porta hepatis adenopathy. Given lack of IV contrast, is difficult to delineate whether this is secondary to compression or vascular extension of tumor. 3. Ascites. 4. Extensive abdominal and retrocrural nodal metastasis. 5. Decreased sensitivity and specificity exam due to technique related factors, as described above.   Electronically Signed   By: Abigail Miyamoto M.D.   On: 05/28/2014 09:27   US Renal  05/17/2014   CLINICAL DATA:  History of colon cancer and liposarcoma. Increased creatinine.  EXAM: RENAL/URINARY TRACT ULTRASOUND COMPLETE  COMPARISON:  None.  FINDINGS: Right Kidney:  Length: 11.3 cm. Echogenicity within normal limits. No mass or hydronephrosis visualized.  Left Kidney:  Length: 11.8 cm. Echogenicity within normal limits. No hydronephrosis visualized. There are simple cysts within  the left kidney, largest in the upper pole left kidney measuring 1.5 cm.  Bladder:  Limited visualization.  Others: There is ascites.  IMPRESSION: No acute abnormality within the kidneys. Simple cysts within the left kidney. Ascites.   Electronically Signed   By: Abelardo Diesel M.D.   On: 05/17/2014 10:13   US Paracentesis  05/25/2014   CLINICAL DATA:  History of colon cancer, recurrent ascites, request for paracentesis.  EXAM: ULTRASOUND GUIDED DIAGNOSTIC AND THERAPEUTIC PARACENTESIS  COMPARISON:  05/20/14.  PROCEDURE: An ultrasound guided paracentesis was thoroughly discussed with the patient and questions answered. The benefits, risks, alternatives and complications were also discussed. The patient understands and wishes to proceed with the procedure. Written consent was obtained.  Ultrasound was performed to localize and mark an adequate pocket of fluid in the left lower quadrant of the abdomen. The area was then prepped and draped in the normal sterile fashion. 1% Lidocaine was used for local anesthesia. Under ultrasound guidance a 19 gauge Yueh catheter was introduced. Paracentesis was performed. The catheter was removed and a dressing applied.  Complications:  None immediate.  FINDINGS: A total of approximately 5.4 liters of amber colored fluid was removed. A fluid sample was sent for laboratory analysis.  IMPRESSION: Successful ultrasound guided paracentesis yielding 5.4 liters of ascites.  Read By:  Tsosie Billing PA-C   Electronically Signed   By: Sandi Mariscal M.D.   On: 05/25/2014 14:35   US Paracentesis  05/20/2014   CLINICAL DATA:  Patient with history of small bowel carcinoma, colon cancer, liposarcoma, recurrent ascites, renal insufficiency. Request is made for diagnostic and therapeutic paracentesis up to 4 liters.  EXAM: ULTRASOUND GUIDED DIAGNOSTIC AND THERAPEUTIC PARACENTESIS  COMPARISON:  PRIOR PARACENTESIS ON 05/17/2014  PROCEDURE: An ultrasound guided paracentesis was thoroughly discussed  with the patient and questions answered. The benefits, risks, alternatives and complications were also discussed. The patient understands and wishes to proceed with the procedure. Written consent was obtained.  Ultrasound was performed to localize and mark an adequate pocket of fluid in the left lower quadrant of the abdomen. The area was then prepped and draped in the normal sterile fashion. 1% Lidocaine was used for local anesthesia. Under ultrasound guidance a 19 gauge Yueh catheter  was introduced. Paracentesis was performed. The catheter was removed and a dressing applied.  Complications: None.  FINDINGS: A total of approximately 4 liters of yellow fluid was removed. A portion of the fluid was submitted to the lab for cytology.  IMPRESSION: Successful ultrasound guided diagnostic and therapeutic paracentesis yielding 4 liters of ascites.  Read by: Rowe Robert, PA-C   Electronically Signed   By: Corrie Mckusick O.D.   On: 05/20/2014 17:29   US Paracentesis  05/17/2014   CLINICAL DATA:  Patient with history of colon cancer, liposarcoma; now with ascites, renal insufficiency. Request is made for diagnostic and therapeutic paracentesis.  EXAM: ULTRASOUND GUIDED DIAGNOSTIC AND THERAPEUTIC PARACENTESIS  COMPARISON:  None.  PROCEDURE: An ultrasound guided paracentesis was thoroughly discussed with the patient and questions answered. The benefits, risks, alternatives and complications were also discussed. The patient understands and wishes to proceed with the procedure. Written consent was obtained.  Ultrasound was performed to localize and mark an adequate pocket of fluid in the left lower quadrant of the abdomen. The area was then prepped and draped in the normal sterile fashion. 1% Lidocaine was used for local anesthesia. Under ultrasound guidance a 19 gauge Yueh catheter was introduced. Paracentesis was performed. The catheter was removed and a dressing applied.  Complications: None.  FINDINGS: A total of  approximately 2.4 liters of yellow fluid was removed. A fluid sample was sent for cytology. Only the above amount of fluid was aspirated at this time secondary to patient's known renal insufficiency.  IMPRESSION: Successful ultrasound guided diagnostic and therapeutic paracentesis yielding 2.4 liters of ascites.  Read by: Rowe Robert, PA-C   Electronically Signed   By: Corrie Mckusick O.D.   On: 05/17/2014 10:33   Ct Biopsy  05/26/2014   CLINICAL DATA:  68 year old with history of liposarcoma and small bowel resection. Recent CT demonstrated ascites, enlarged retroperitoneal lymph nodes and suspicious lesion in the right abdomen.  EXAM: CT-GUIDED BIOPSY OF A RIGHT ABDOMINAL LESION.  Physician: Stephan Minister. Anselm Pancoast, MD  MEDICATIONS: 2 mg versed, 200 mcg fentanyl. A radiology nurse monitored the patient for moderate sedation.  ANESTHESIA/SEDATION: Moderate sedation time: 20 min  PROCEDURE: The procedure was explained to the patient. The risks and benefits of the procedure were discussed and the patient's questions were addressed. Informed consent was obtained from the patient. Patient was initially placed supine on the CT scanner and images of the abdomen and pelvis were obtained. The lesion in the posterior right abdomen was targeted for biopsy. Patient was placed prone on the CT scanner. The right side of the abdomen was prepped with Betadine and a sterile drape was placed. 1% lidocaine was used for local anesthetic. A 17 gauge needle was directed into the lesion with CT guidance. Needle was placed along the posterior aspect of the lesion. A total of 5 core biopsies were performed with an 18 gauge device. Two adequate specimens were obtained. Specimens were placed in formalin. 17 gauge needle was removed without complication. Bandage placed over the puncture site.  FINDINGS: Patient has small-to-moderate amount of ascites despite recent paracentesis. There is diffuse low density of the right hepatic lobe compared to the  left and this represents a change from the exam in 10/05/2013. Difficult to exclude lesions within the right hepatic lobe. Again noted are retroperitoneal lymph nodes in the periaortic and pericaval regions. Inferior to the right kidney, there is abnormal soft tissue roughly measuring 3.6 x 9.6 cm and this could represent a mesenteric or retroperitoneal  mass. Question a low-density lesion near the uncinate process of the pancreas that is roughly similar from the exam on 10/12/2013. Focal fluid collection in the right lower quadrant probably related to ascites. Difficult to exclude peritoneal nodules versus pockets of fluid in the anterior lower abdomen and anterior pelvic region. Question a lymph node versus pocket of fluid in the right hemipelvis on sequence 2, image 81  COMPLICATIONS: None  IMPRESSION: CT-guided core biopsies of a right abdominal mass.  Decreased attenuation of the right hepatic lobe and difficult to exclude right hepatic lesions. Findings could be related to steatosis but could be better characterization with MRI.   Electronically Signed   By: Markus Daft M.D.   On: 05/26/2014 17:19    Labs:  CBC:  Recent Labs  05/24/14 1501 05/26/14 0717 05/27/14 1338 05/31/14 1150  WBC 20.6* 21.3* 21.7* 20.4*  HGB 11.0* 10.8* 11.0* 10.8*  HCT 33.4* 33.0* 35.2* 32.7*  PLT 400 413* 416* 349    COAGS:  Recent Labs  11/15/13 1259 12/15/13 0324 05/26/14 0717 05/31/14 1150  INR 1.08 1.24 1.27 1.32  APTT 36  --  39*  --     BMP:  Recent Labs  12/20/13 0613 12/21/13 0405 12/22/13 0413  05/17/14 0813 05/24/14 1501 05/26/14 0717 05/27/14 1338  NA 141 135* 136*  < > 133* 125* 129* 126*  K 4.1 4.1 4.2  < > 5.4* 6.4 No visable hemolysis* 6.2* 5.0  CL 97 95* 100  --   --   --  95*  --   CO2 34* 30 26  < > 19* 24 23 22   GLUCOSE 167* 159* 158*  < > 166* 157* 130* 211*  BUN 23 13 11   < > 39.9* 33.0* 34* 32.7*  CALCIUM 8.9 8.6 8.8  < > 8.5 8.9 8.4 8.5  CREATININE 1.11 0.91 0.83  <  > 2.1* 2.0* 1.98* 1.8*  GFRNONAA 67* 86* 89*  --   --   --  33*  --   GFRAA 77* >90 >90  --   --   --  38*  --   < > = values in this interval not displayed.  LIVER FUNCTION TESTS:  Recent Labs  11/16/13 1031 12/07/13 1105 05/16/14 1240 05/24/14 1501  BILITOT 0.33 0.2* 0.49 0.44  AST 20 29 81* 95*  ALT 24 23 18 16   ALKPHOS 152* 134* 393* 411*  PROT 7.3 7.4 8.0 6.8  ALBUMIN 3.2* 3.2* 2.8* 2.2*    TUMOR MARKERS:  Recent Labs  10/13/13 0450 11/16/13 1031 05/16/14 1240 05/24/14 1502  CEA 5.8* 2.6 30.7* 46.5*   Results for orders placed during the hospital encounter of 05/31/14  CBC WITH DIFFERENTIAL      Result Value Ref Range   WBC 20.4 (*) 4.0 - 10.5 K/uL   RBC 4.10 (*) 4.22 - 5.81 MIL/uL   Hemoglobin 10.8 (*) 13.0 - 17.0 g/dL   HCT 32.7 (*) 39.0 - 52.0 %   MCV 79.8  78.0 - 100.0 fL   MCH 26.3  26.0 - 34.0 pg   MCHC 33.0  30.0 - 36.0 g/dL   RDW 17.2 (*) 11.5 - 15.5 %   Platelets 349  150 - 400 K/uL   Neutrophils Relative % 88 (*) 43 - 77 %   Neutro Abs 18.0 (*) 1.7 - 7.7 K/uL   Lymphocytes Relative 5 (*) 12 - 46 %   Lymphs Abs 1.0  0.7 - 4.0 K/uL   Monocytes Relative  6  3 - 12 %   Monocytes Absolute 1.2 (*) 0.1 - 1.0 K/uL   Eosinophils Relative 1  0 - 5 %   Eosinophils Absolute 0.1  0.0 - 0.7 K/uL   Basophils Relative 0  0 - 1 %   Basophils Absolute 0.1  0.0 - 0.1 K/uL  BASIC METABOLIC PANEL      Result Value Ref Range   Sodium 123 (*) 137 - 147 mEq/L   Potassium 5.1  3.7 - 5.3 mEq/L   Chloride 89 (*) 96 - 112 mEq/L   CO2 18 (*) 19 - 32 mEq/L   Glucose, Bld 245 (*) 70 - 99 mg/dL   BUN 40 (*) 6 - 23 mg/dL   Creatinine, Ser 1.99 (*) 0.50 - 1.35 mg/dL   Calcium 8.5  8.4 - 10.5 mg/dL   GFR calc non Af Amer 33 (*) >90 mL/min   GFR calc Af Amer 38 (*) >90 mL/min   Anion gap 16 (*) 5 - 15  PROTIME-INR      Result Value Ref Range   Prothrombin Time 16.5 (*) 11.6 - 15.2 seconds   INR 1.32  0.00 - 1.49    Assessment and Plan:  Dean Owens is a 68  y.o. male with history of Lynch syndrome and prior squamous cell skin cancer, prostate cancer, SB cancer and liposarcoma as well as recurrent ascites. He underwent a right abdominal mass biopsy by our service on 10/1 which revealed stromal fibrosis but no malignancy. MRI abdomen on 10/3 revealed large volume infiltrative tumor/tumors throughout the right lobe of the liver with involvement of the portal vein and porta hepatis adenopathy. Also present was extensive abdominal/retrocrural adenopathy.He presents today for hepatic venous doppler study in addition to paracentesis and liver lesion biopsy. Details/risks of procedures d/w pt/wife with their understanding and consent. Pt also noted to be hyponatremic and with persistent renal insufficiency/leukocytosis. Dr. Benay Spice notified - pt scheduled for nephrology consult on 10/13.         Signed: Autumn Messing 05/31/2014, 12:17 PM

## 2014-06-01 ENCOUNTER — Telehealth: Payer: Self-pay | Admitting: Oncology

## 2014-06-01 LAB — AFP TUMOR MARKER: AFP-Tumor Marker: 1.7 ng/mL (ref <6.1)

## 2014-06-01 NOTE — Telephone Encounter (Signed)
Lft msg for pt confirming MD per provider schedule..... KJ

## 2014-06-02 ENCOUNTER — Telehealth: Payer: Self-pay | Admitting: Oncology

## 2014-06-02 ENCOUNTER — Telehealth: Payer: Self-pay | Admitting: *Deleted

## 2014-06-02 ENCOUNTER — Encounter: Payer: Self-pay | Admitting: *Deleted

## 2014-06-02 ENCOUNTER — Other Ambulatory Visit: Payer: Self-pay | Admitting: *Deleted

## 2014-06-02 ENCOUNTER — Other Ambulatory Visit: Payer: Medicare Other

## 2014-06-02 ENCOUNTER — Encounter (HOSPITAL_COMMUNITY): Payer: Self-pay | Admitting: Pharmacy Technician

## 2014-06-02 ENCOUNTER — Ambulatory Visit (HOSPITAL_BASED_OUTPATIENT_CLINIC_OR_DEPARTMENT_OTHER): Payer: Medicare Other | Admitting: Oncology

## 2014-06-02 ENCOUNTER — Other Ambulatory Visit: Payer: Self-pay | Admitting: Radiology

## 2014-06-02 VITALS — BP 120/66 | HR 112 | Resp 18 | Ht 71.0 in | Wt 187.6 lb

## 2014-06-02 DIAGNOSIS — I81 Portal vein thrombosis: Secondary | ICD-10-CM

## 2014-06-02 DIAGNOSIS — C179 Malignant neoplasm of small intestine, unspecified: Secondary | ICD-10-CM

## 2014-06-02 DIAGNOSIS — C779 Secondary and unspecified malignant neoplasm of lymph node, unspecified: Secondary | ICD-10-CM

## 2014-06-02 DIAGNOSIS — N19 Unspecified kidney failure: Secondary | ICD-10-CM

## 2014-06-02 DIAGNOSIS — D509 Iron deficiency anemia, unspecified: Secondary | ICD-10-CM

## 2014-06-02 DIAGNOSIS — R188 Other ascites: Secondary | ICD-10-CM

## 2014-06-02 DIAGNOSIS — Z800 Family history of malignant neoplasm of digestive organs: Secondary | ICD-10-CM

## 2014-06-02 DIAGNOSIS — M542 Cervicalgia: Secondary | ICD-10-CM

## 2014-06-02 DIAGNOSIS — C787 Secondary malignant neoplasm of liver and intrahepatic bile duct: Secondary | ICD-10-CM

## 2014-06-02 MED ORDER — LIDOCAINE-PRILOCAINE 2.5-2.5 % EX CREA: TOPICAL_CREAM | CUTANEOUS | Status: DC

## 2014-06-02 NOTE — Progress Notes (Signed)
[facility] OFFICE PROGRESS NOTE   Diagnosis: Small bowel carcinoma  INTERVAL HISTORY:   Mr. Stencel returns as scheduled. He underwent a paracentesis and liver biopsy on 05/31/2014. 4.8 L of ascites were removed. The pathology from the liver core biopsy ([id number]) confirmed metastatic carcinoma consistent with small bowel carcinoma. I discussed the case with pathology and they indicated the histology appears similar to the small bowel tumor resected in February of 2015.  He reports the abdominal fluid is accumulating less rapidly. No nausea. He is taking oxycodone for soreness at the biopsy site and neck pain. He reports right lower posterior neck pain for the past month. No arm pain. Objective:  Vital signs in last 24 hours:  Blood pressure 120/66, pulse 112, temperature 0 F (-17.8 C), resp. rate 18, height 5' 11"  (1.803 m), weight 187 lb 9.6 oz (85.095 kg), SpO2 100.00%.    HEENT: Neck without mass or tenderness, the area of pain localizes to the right low paraspinous region near C7 or T1 Resp: Lungs clear bilaterally Cardio: Regular rate and rhythm GI: The liver edge is palpable in the right subcostal region, the abdomen is mildly distended, nontender Vascular: Trace pitting edema at the left greater than right lower leg, no erythema Musculoskeletal: Neck without tenderness or mass   Lab Results:  Lab Results  Component Value Date   WBC 20.4* 05/31/2014   HGB 10.8* 05/31/2014   HCT 32.7* 05/31/2014   MCV 79.8 05/31/2014   PLT 349 05/31/2014   NEUTROABS 18.0* 05/31/2014   05/31/2014-creatinine 1.99, BUN 40, potassium 5.1  Lab Results  Component Value Date   CEA 46.5* 05/24/2014    Imaging: MRI of the liver 05/28/2014-multiple hepatic masses, confluent tumor and adenopathy at the expected location of the portal vein, extensive retroperitoneal and porta hepatis adenopathy, ascites  I reviewed the MRI images with Mr. Rosencrans  Doppler of the liver  05/31/2014-the portal vein is occluded, no varices  Medications: I have reviewed the patient's current medications.  Assessment/Plan: 1. Adenocarcinoma of the small bowel, stage III (T3 N1), status post a partial small bowel resection 10/14/2013  The tumor returned microsatellite instability high with loss of MSH2 expression MRI of the liver 05/28/2014 with multiple hepatic metastases and portal/retroperitoneal lymphadenopathy, ultrasound-guided biopsy of the liver 05/31/2014 confirmed metastatic small bowel carcinoma 2. Colon cancer X3, 1997 and 1999, status post an intra-abdominal colectomy with ileorectal anastomosis in May 1999  3. Iron deficiency anemia  4. Diabetes  5. Hypothyroidism  6. Hypertension  7. 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 Ascites-likely secondary to portal hypertension versus carcinomatosis, repeat peritoneal fluid cytologies negative for tumor cells 10. 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 Right neck pain-most likely secondary to a benign musculoskeletal condition, he will be referred for a CT of the neck 12. Renal failure-likely related to carcinomatosis and hepatic metastases, he is scheduled for a nephrology   Disposition:  Mr. Mcgreal has hereditary non-polyposis colon cancer syndrome. He has been diagnosed with metastatic small bowel carcinoma. He appears to have extensive liver and lymph node metastases. Ascites may be related to carcinomatosis  or portal hypertension.  I discussed treatment options  with Mr. Pindell. I recommend beginning systemic chemotherapy with FOLFOX. He understands there is no well-defined standard chemotherapy regimen for small bowel carcinoma. We extrapolate from data in patients with colorectal cancer.  I reviewed the potential toxicities associated with the FOLFOX regimen including the chance for nausea/vomiting, alopecia, mucositis, diarrhea, and hematologic toxicity. We discussed the rash, hyperpigmentation, and hand/foot syndrome associated with 5-fluorouracil. We reviewed the various types of neuropathy seen with oxaliplatin. He agrees to proceed. Mr. Quincy Simmonds will attend a chemotherapy teaching class. He will be referred for placement of a Port-A-Cath.  I will refer him to Dr. Reynaldo Minium as he may be eligible for a PD-1 agent as part of a clinical trial for patients with HNPCC associated small bowel carcinoma.  We discussed the indication for anticoagulation therapy. He does not appear to have acute portal vein thrombosis. I reviewed the liver MRI in radiology and he appears to have collateral vessels. He does not have clinical evidence of mesenteric ischemia. He has been unable to receive IV contrast secondary to renal failure, but the MRI is most consistent with extrinsic compression of the portal vein by tumor.  Mr. Sheehan will be scheduled for a first cycle of FOLFOX and an office visit 06/08/2014.  Approximately 40 minutes were spent with patient today. The majority of time was used for counseling and coordination of care.  Betsy Coder, MD  06/02/2014  8:19 AM

## 2014-06-02 NOTE — Telephone Encounter (Signed)
Per staff message and POF I have scheduled appts. Advised scheduler of appts. JMW  

## 2014-06-02 NOTE — Telephone Encounter (Signed)
Pt confirmed labs/ov/pump per 10/08 POF, sent msg to add Folfox, sch pt for chemo edu, sch pt for port surgery, CT scan, gave pt AVS.... Cherylann Banas

## 2014-06-02 NOTE — Telephone Encounter (Signed)
ALL APPTS PER 1ST AND 2ND 10/8 POF COMPLETED. REFERRAL MESSAGE TO HIM RE APPT AT [facility] W/DR Reynaldo Minium.

## 2014-06-03 ENCOUNTER — Encounter (HOSPITAL_COMMUNITY): Payer: Self-pay

## 2014-06-03 ENCOUNTER — Ambulatory Visit (HOSPITAL_COMMUNITY)
Admission: RE | Admit: 2014-06-03 | Discharge: 2014-06-03 | Disposition: A | Discharge status: Home / Self Care | Payer: Medicare Other | Source: Ambulatory Visit | Attending: Oncology | Admitting: Oncology

## 2014-06-03 ENCOUNTER — Other Ambulatory Visit: Payer: Self-pay | Admitting: Oncology

## 2014-06-03 DIAGNOSIS — Z452 Encounter for adjustment and management of vascular access device: Secondary | ICD-10-CM | POA: Diagnosis present

## 2014-06-03 DIAGNOSIS — I10 Essential (primary) hypertension: Secondary | ICD-10-CM | POA: Diagnosis not present

## 2014-06-03 DIAGNOSIS — Z85038 Personal history of other malignant neoplasm of large intestine: Secondary | ICD-10-CM | POA: Diagnosis not present

## 2014-06-03 DIAGNOSIS — C179 Malignant neoplasm of small intestine, unspecified: Secondary | ICD-10-CM | POA: Insufficient documentation

## 2014-06-03 DIAGNOSIS — E119 Type 2 diabetes mellitus without complications: Secondary | ICD-10-CM | POA: Insufficient documentation

## 2014-06-03 DIAGNOSIS — Z9049 Acquired absence of other specified parts of digestive tract: Secondary | ICD-10-CM | POA: Insufficient documentation

## 2014-06-03 DIAGNOSIS — Z8546 Personal history of malignant neoplasm of prostate: Secondary | ICD-10-CM | POA: Diagnosis not present

## 2014-06-03 DIAGNOSIS — Z79899 Other long term (current) drug therapy: Secondary | ICD-10-CM | POA: Insufficient documentation

## 2014-06-03 DIAGNOSIS — C786 Secondary malignant neoplasm of retroperitoneum and peritoneum: Secondary | ICD-10-CM | POA: Insufficient documentation

## 2014-06-03 DIAGNOSIS — Z923 Personal history of irradiation: Secondary | ICD-10-CM | POA: Insufficient documentation

## 2014-06-03 DIAGNOSIS — Z79891 Long term (current) use of opiate analgesic: Secondary | ICD-10-CM | POA: Insufficient documentation

## 2014-06-03 DIAGNOSIS — E039 Hypothyroidism, unspecified: Secondary | ICD-10-CM | POA: Diagnosis not present

## 2014-06-03 LAB — GLUCOSE, CAPILLARY: Glucose-Capillary: 109 mg/dL — ABNORMAL HIGH (ref 70–99)

## 2014-06-03 MED ORDER — FENTANYL CITRATE 0.05 MG/ML IJ SOLN
INTRAMUSCULAR | Status: AC
  Filled 2014-06-03: qty 4

## 2014-06-03 MED ORDER — HEPARIN SOD (PORK) LOCK FLUSH 100 UNIT/ML IV SOLN
INTRAVENOUS | Status: AC
  Filled 2014-06-03: qty 5

## 2014-06-03 MED ORDER — HEPARIN SOD (PORK) LOCK FLUSH 100 UNIT/ML IV SOLN
500.0000 [IU] | Freq: Once | INTRAVENOUS | Status: AC
  Administered 2014-06-03: 500 [IU] via INTRAVENOUS

## 2014-06-03 MED ORDER — CEFAZOLIN SODIUM-DEXTROSE 2-3 GM-% IV SOLR
2.0000 g | INTRAVENOUS | Status: AC
  Administered 2014-06-03: 2 g via INTRAVENOUS

## 2014-06-03 MED ORDER — FENTANYL CITRATE 0.05 MG/ML IJ SOLN
INTRAMUSCULAR | Status: AC | PRN
  Administered 2014-06-03: 100 ug via INTRAVENOUS
  Administered 2014-06-03: 50 ug via INTRAVENOUS

## 2014-06-03 MED ORDER — SODIUM CHLORIDE 0.9 % IV SOLN
INTRAVENOUS | Status: DC
  Administered 2014-06-03: 500 mL via INTRAVENOUS

## 2014-06-03 MED ORDER — LIDOCAINE HCL 1 % IJ SOLN
INTRAMUSCULAR | Status: AC
  Filled 2014-06-03: qty 20

## 2014-06-03 MED ORDER — MIDAZOLAM HCL 2 MG/2ML IJ SOLN
INTRAMUSCULAR | Status: AC
  Filled 2014-06-03: qty 4

## 2014-06-03 MED ORDER — MIDAZOLAM HCL 2 MG/2ML IJ SOLN
INTRAMUSCULAR | Status: AC | PRN
  Administered 2014-06-03: 2 mg via INTRAVENOUS
  Administered 2014-06-03: 1 mg via INTRAVENOUS

## 2014-06-03 MED ORDER — CEFAZOLIN SODIUM-DEXTROSE 2-3 GM-% IV SOLR
INTRAVENOUS | Status: AC
  Filled 2014-06-03: qty 50

## 2014-06-03 NOTE — Discharge Instructions (Signed)

## 2014-06-03 NOTE — H&P (Signed)
Agree.  Patient seen.  For port placement today.  Although WBC elevated, no clinical signs or symptoms of infection and WBC has been persistently elevated.  Fine to proceed with port placement today.

## 2014-06-03 NOTE — H&P (Signed)
Chief Complaint: "I am here for a port to be placed."  Referring Physician(s): Sherrill,Gary B  History of Present Illness: Dean Owens is a 68 y.o. male with metastatic small bowel carcinoma scheduled today for image guided port a catheter placement for chemotherapy. Labs reveal leukocytosis, the patient denies any fever or chills, any urinary symptoms or recent infection. He denies any chest pain, shortness of breath or palpitations. He denies any active signs of bleeding or excessive bruising. He denies any recent fever or chills. The patient denies any history of sleep apnea or chronic oxygen use. He has previously tolerated sedation without complications.   Past Medical History  Diagnosis Date  . Hypertension   . Type 2 diabetes mellitus   . Hypothyroidism   . History of colon cancer NO RECURRENCE    S/P COLECTOMY X3  LAST ONE 1998--  [place]  . ED (erectile dysfunction) of organic origin   . Right knee DJD   . Eczema   . OA (osteoarthritis)     LEFT KNEE  . Wears glasses   . History of iron deficiency anemia   . History of squamous cell carcinoma excision   . Accelerated hypertension 10/21/2013  . Type II diabetes mellitus 10/21/2013  . Unspecified hypothyroidism 10/21/2013  . 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 Lynch syndrome   . History of nonmelanoma skin cancer   . Anemia   . Recurrent prostate carcinoma FOLLOWED BY DR Gaynelle Arabian    DX 2007  S/P RADIOACTIVE SEED IMPLANTS  . 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 Ileus     s/p op 3 days  . History of blood transfusion 09/2013    PRBC  . S/P radiation therapy 01/19/14-03/01/14    retroperitonela sarcoma/abdomen    Past Surgical History  Procedure Laterality Date  . Givens capsule study N/A 07/05/2013    Procedure: GIVENS CAPSULE STUDY;  Surgeon: [name], MD;  Location: [place];  Service: Endoscopy;  Laterality: N/A;  . Radioactive prostate seed implants  OCT 2007  .  Knee arthroscopy Bilateral 2006 &  2007  . Tonsillectomy  AS CHILD  . Appendectomy  AGE 44  . Cryoablation N/A 10/01/2013    Procedure: CRYO ABLATION PROSTATE;  Surgeon: [name], MD;  Location: [facility];  Service: Urology;  Laterality: N/A;  . Prostate surgery    . Right colectomy  09/07/1995  . Sigmoidectomy  11/13/95  . Total colectomy  12/29/1997    ileorectal anastomosis  . Laparotomy N/A 10/14/2013    Procedure: EXPLORATORY LAPAROTOMY;  Surgeon: [name], MD;  Location: [place];  Service: General;  Laterality: N/A;  . Bowel resection N/A 10/14/2013    Procedure: SMALL BOWEL RESECTION;  Surgeon: [name], MD;  Location: [place];  Service: General;  Laterality: N/A;  . Lysis of adhesion N/A 10/14/2013    Procedure: LYSIS OF ADHESION;  Surgeon: [name], MD;  Location: [place];  Service: General;  Laterality: N/A;  . 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 Cystoscopy with retrograde pyelogram, ureteroscopy and stent placement Right 12/14/2013    Procedure: CYSTOSCOPY WITH RIGHT RETROGRADE PYELOGRAM / [place];  Surgeon: [name], MD;  Location: [facility];  Service: Urology;  Laterality: Right;  Allergies: Review of patient's allergies indicates no known allergies.  Medications: Prior to Admission medications   Medication Sig Start Date End Date Taking? Authorizing Provider  amLODipine-benazepril (LOTREL) 10-20 MG per capsule Take 1 capsule by mouth every morning.    Yes Historical Provider, MD  diphenoxylate-atropine (LOMOTIL) 2.5-0.025 MG per tablet Take 1 tablet by mouth 4 (four) times daily as needed for diarrhea or loose stools.   Yes Historical Provider, MD  ferrous sulfate 325 (65 FE) MG tablet Take 325 mg by mouth daily with breakfast.  11/18/13  Yes Historical Provider, MD    furosemide (LASIX) 20 MG tablet Take 20 mg by mouth every morning.    Yes Historical Provider, MD  levothyroxine (SYNTHROID, LEVOTHROID) 50 MCG tablet Take 50 mcg by mouth daily before breakfast.   Yes Historical Provider, MD  Multiple Vitamins-Minerals (ADULT GUMMY) CHEW Chew 2 capsules by mouth every morning.    Yes Historical Provider, MD  ondansetron (ZOFRAN) 8 MG tablet Take 8 mg by mouth every 8 (eight) hours as needed for nausea or vomiting.   Yes Historical Provider, MD  oxyCODONE-acetaminophen (PERCOCET/ROXICET) 5-325 MG per tablet Take 1-2 tablets by mouth every 6 (six) hours as needed for moderate pain or severe pain.   Yes Historical Provider, MD  glipiZIDE (GLUCOTROL) 5 MG tablet Take 5 mg by mouth daily before breakfast.    Historical Provider, MD  lidocaine-prilocaine (EMLA) cream Apply to port a cath site one hour prior to use. 06/02/14   Ladell Pier, MD    Family History  Problem Relation Age of Onset  . Heart failure Mother   . Heart failure Father   . Ovarian cancer Maternal Aunt   . Uterine cancer Paternal Aunt 71  . Colon cancer Paternal Aunt 87  . Cancer Paternal Aunt 20    sebaceous adenoma; Muir-Torre syndrome  . Colon cancer Paternal Grandmother   . Colon cancer Brother 49  . Colon cancer Cousin 81  . Uterine cancer Cousin 78  . Colon polyps Cousin   . Brain cancer Cousin 17    astrocytoma; Mary's daughter  . Colon polyps Cousin   . Lung cancer Cousin 47  . Breast cancer Cousin 63  . Uterine cancer Cousin 70  . Colon cancer Cousin 48    History   Social History  . Marital Status: Married    Spouse Name: Jenny Reichmann    Number of Children: 3  . Years of Education: N/A   Occupational History  .     Social History Main Topics  . Smoking status: Former Smoker -- 0.50 packs/day for 15 years    Types: Cigarettes    Quit date: 09/07/2013  . Smokeless tobacco: Never Used  . Alcohol Use: Yes     Comment: OCCASIONAL beer  . Drug Use: No  . Sexual  Activity: Yes   Other Topics Concern  . None   Social History Narrative   Married, wife Caren Griffins for 40+ years   Mortgage industry-employed by [employer]   # 3 children-#2 girls and #1 boy   #4 grandchildren (boys)   No pets or hobbies (used to play golf)   Review of Systems: A 12 point ROS discussed and pertinent positives are indicated in the HPI above.  All other systems are negative.  Review of Systems  Vital Signs: BP 135/73  Pulse 98  Temp(Src) 98.2 F (36.8 C) (Oral)  Resp 18  SpO2 100%  Physical Exam  Constitutional: He is oriented to  person, place, and time. No distress.  HENT:  Head: Normocephalic and atraumatic.  Neck: No tracheal deviation present.  Cardiovascular: Normal rate and regular rhythm.  Exam reveals no gallop and no friction rub.   No murmur heard. Pulmonary/Chest: Effort normal and breath sounds normal. No respiratory distress. He has no wheezes. He has no rales.  Neurological: He is alert and oriented to person, place, and time.  Skin: He is not diaphoretic.  Psychiatric: He has a normal mood and affect. His behavior is normal. Thought content normal.   Imaging: Ct Abdomen Pelvis Wo Contrast  05/27/2014   ADDENDUM REPORT: 05/27/2014 10:25  ADDENDUM: It should be noted that there is decreased attenuation of the right hepatic lobe present. The portal vein is also prominent. To excluded hepatic disease and/or portal vein thrombosis further evaluation of the liver and portal vein with MRI of the liver should be considered. These results were called by telephone at the time of interpretation on 05/27/2014 at 10:22 am to Dr. Drue Second , who verbally acknowledged these results.   Electronically Signed   By: Marcello Moores  Register   On: 05/27/2014 10:25   05/27/2014   CLINICAL DATA:  History of colon cancer and liposarcoma. Increasing abdominal pain ascites. Preprocedural CT for CT guided biopsy .  EXAM: CT ABDOMEN AND PELVIS WITHOUT CONTRAST  TECHNIQUE:  Multidetector CT imaging of the abdomen and pelvis was performed following the standard protocol without IV contrast.  COMPARISON:  CT 05/17/2014, 10/26/2013  FINDINGS: No focal new hepatic abnormality. Stable focal is seen of the right lobe of the liver. This may be vascular. Similar finding at the dome of the liver. Spleen is normal. Pancreas is normal. Calcifications noted in the pancreas. No biliary distention. The gallbladder is nondistended.  The adrenals are unremarkable. The kidneys are unremarkable. No evidence of obstructing ureteral stone. The bladder is nondistended. Prostate seeds are noted.  Multiple retroperitoneal/retrocrural lymph nodes noted. Lymph nodes are stable from prior recent CT of 05/17/2014. Lymph nodes measure up to 1.7 cm. Periportal adenopathy is again noted. Multiple mesenteric nodular densities are noted consistent with peritoneal metastatic disease. Previously identified ascites again noted. The bladder of ascites is diminished on today's exam. Retroperitoneal fluid collection noted adjacent to the psoas is unchanged in size and may represent a seroma from prior surgery as previously described. Multiple clips are noted throughout the abdomen.  Stool is present throughout the colon. Postsurgical changes noted along left colon. There is no bowel distention. The stomach is nondistended. No free air.  Coronary artery disease. Mild changes of pleural-parenchymal scarring in the lung bases. No acute bony abnormality. Degenerative changes lumbar spine and both hips. No focal bony lesions.  IMPRESSION: 1. Previously identified ascites is decreased slightly. Ascites persists. Multiple mesenteric nodular density is noted consistent with peritoneal metastatic disease. 2. Multiple stable retroperitoneal/retrocrural and periportal lymph nodes again noted. 3. Stable fluid collection adjacent to the right psoas at site of prior surgery, most likely postoperative seroma, no change from prior recent  study. 4. Postsurgical changes noted in the left colon. Prostate seeds are noted. 5. Coronary artery disease.  Electronically Signed: ByMarcello Moores  Register On: 05/26/2014 10:58   Ct Abdomen Pelvis Wo Contrast  05/17/2014   CLINICAL DATA:  Colon cancer.  Abdominal distention.  EXAM: CT CHEST, ABDOMEN AND PELVIS WITHOUT CONTRAST  TECHNIQUE: Multidetector CT imaging of the chest, abdomen and pelvis was performed following the standard protocol without IV contrast.  COMPARISON:  11/15/2013  FINDINGS: CT  CHEST FINDINGS  Chest wall:  No supraclavicular or axillary mass or adenopathy. The thyroid gland is unremarkable. The bony thorax is intact. Moderate degenerative changes involving the thoracic spine. Remote healed left rib fractures are noted.  Mediastinum: The heart is normal in size. No pericardial effusion. Coronary artery calcifications are noted. There is mild tortuosity, ectasia and calcification of the thoracic aorta but no focal aneurysm. The esophagus is grossly normal.  Lungs:  No worrisome pulmonary lesions. There is right lower lobe scarring change. No infiltrates or edema. There is a small right pleural effusion and minimal overlying atelectasis.  CT ABDOMEN AND PELVIS FINDINGS  There is large volume ascites surrounding the liver and spleen and continuing down into the pericolic gutters and mesenteric. No obvious hepatic lesions. The spleen is normal in size. The pancreas is grossly normal. Stable calcifications in the pancreatic head. The adrenal glands and kidneys are grossly normal without contrast. There are enlarged gastrohepatic ligament, subdiaphragmatic, celiac axis, mesenteric and retroperitoneal lymph nodes which are progressive when compared to prior studies. There is also extensive mesenteric and omental nodularity consistent with peritoneal carcinomatosis. Retrocrural adenopathy is noted.  There are surgical changes in the right lower quadrant from a previous soft tissue mass resection.  There is a residual cystic structure anterior to the right psoas muscle which is most likely a postoperative seroma. A small bowel resection is noted in the left lower quadrant/pelvis. No complicating features. Peritoneal implants are noted in the pelvis.  The stomach, duodenum, small bowel and colon are grossly normal. No obstructive findings. The bladder, prostate gland and seminal vesicles are unremarkable. Prostate seeds are noted. The aorta demonstrates stable advanced atherosclerotic calcifications.  IMPRESSION: 1. Stable CT appearance of the chest. No findings for metastatic disease. 2. Interval development of large volume abdominal ascites and diffuse peritoneal carcinomatosis and lymphadenopathy. 3. Small right pleural effusion and overlying atelectasis.   Electronically Signed   By: Kalman Jewels M.D.   On: 05/17/2014 17:08   Dg Abd 1 View  05/16/2014   CLINICAL DATA:  History colon cancer.  Abdominal pain.  EXAM: ABDOMEN - 1 VIEW  COMPARISON:  12/20/2013.  FINDINGS: Surgical clips are noted throughout the abdomen and pelvis. Increased density is noted throughout the abdomen. These findings are consistent with ascites. There is no bowel distention. No free air is identified. No acute bony abnormalities identified.  IMPRESSION: 1. Findings consistent with ascites. This can be confirmed with ultrasound. 2. Surgical clips noted throughout the abdomen and pelvis. No evidence of bowel distention or free air.   Electronically Signed   By: Marcello Moores  Register   On: 05/16/2014 12:44   Ct Chest Wo Contrast  05/17/2014   CLINICAL DATA:  Colon cancer.  Abdominal distention.  EXAM: CT CHEST, ABDOMEN AND PELVIS WITHOUT CONTRAST  TECHNIQUE: Multidetector CT imaging of the chest, abdomen and pelvis was performed following the standard protocol without IV contrast.  COMPARISON:  11/15/2013  FINDINGS: CT CHEST FINDINGS  Chest wall:  No supraclavicular or axillary mass or adenopathy. The thyroid gland is unremarkable.  The bony thorax is intact. Moderate degenerative changes involving the thoracic spine. Remote healed left rib fractures are noted.  Mediastinum: The heart is normal in size. No pericardial effusion. Coronary artery calcifications are noted. There is mild tortuosity, ectasia and calcification of the thoracic aorta but no focal aneurysm. The esophagus is grossly normal.  Lungs:  No worrisome pulmonary lesions. There is right lower lobe scarring change. No infiltrates or edema. There  is a small right pleural effusion and minimal overlying atelectasis.  CT ABDOMEN AND PELVIS FINDINGS  There is large volume ascites surrounding the liver and spleen and continuing down into the pericolic gutters and mesenteric. No obvious hepatic lesions. The spleen is normal in size. The pancreas is grossly normal. Stable calcifications in the pancreatic head. The adrenal glands and kidneys are grossly normal without contrast. There are enlarged gastrohepatic ligament, subdiaphragmatic, celiac axis, mesenteric and retroperitoneal lymph nodes which are progressive when compared to prior studies. There is also extensive mesenteric and omental nodularity consistent with peritoneal carcinomatosis. Retrocrural adenopathy is noted.  There are surgical changes in the right lower quadrant from a previous soft tissue mass resection. There is a residual cystic structure anterior to the right psoas muscle which is most likely a postoperative seroma. A small bowel resection is noted in the left lower quadrant/pelvis. No complicating features. Peritoneal implants are noted in the pelvis.  The stomach, duodenum, small bowel and colon are grossly normal. No obstructive findings. The bladder, prostate gland and seminal vesicles are unremarkable. Prostate seeds are noted. The aorta demonstrates stable advanced atherosclerotic calcifications.  IMPRESSION: 1. Stable CT appearance of the chest. No findings for metastatic disease. 2. Interval development of  large volume abdominal ascites and diffuse peritoneal carcinomatosis and lymphadenopathy. 3. Small right pleural effusion and overlying atelectasis.   Electronically Signed   By: Kalman Jewels M.D.   On: 05/17/2014 17:08   Mr Liver Wo Contrast  05/28/2014   CLINICAL DATA:  abdominal surgeries 6 weeks ago. Colon cancer. Renal insufficiency. Ascites. Evaluate portal vein. History of colon cancer and liposarcoma.  EXAM: MRI ABDOMEN WITHOUT CONTRAST  TECHNIQUE: Multiplanar multisequence MR imaging was performed without the administration of intravenous contrast.  COMPARISON:  CT of 05/26/2014 and CT biopsy of 05/26/2014.  FINDINGS: Lower chest: Normal heart size without pericardial or pleural effusion.  Hepatobiliary: Infiltrative masses throughout the right lobe of the liver. These are difficult to measure as separate lesions, given their infiltrative nature. Index component at the hepatic dome measures 13.3 x 10.7 cm on image 13 of series 3. More inferior portion in the anterior and posterior segments of the right lobe measures 9.9 x 9.3 cm on image 24 of series 3. Confluent tumor and adjacent adenopathy identified in the expected location of the portal vein. Example mass at 3.1 x 4.3 cm on image 27 of series 3. The superior mesenteric vein and splenic vein appear relatively uninvolved with tumor. Better seen on recent CT.  Incompletely distended gallbladder. No stones or biliary ductal dilatation.  Pancreas: Grossly within normal limits.  Spleen: Possible sub cm T2 hyperintense focus inferiorly on image 31 of series 3. Of doubtful clinical significance.  Adrenals/Urinary Tract: Normal adrenal glands. Bilateral renal cysts. Subcentimeter focus of T1 hyperintensity in the left kidney on image 55 of series 8 May relate to an underlying hemorrhagic cyst. Also of doubtful clinical significance.  Stomach/Bowel: Normal stomach, without wall thickening. Normal caliber of large and small bowel loops.  Vascular/Lymphatic:  Portal vein involvement by tumor, as detailed above. Normal caliber of the abdominal aorta.  Extensive retroperitoneal and porta hepatis adenopathy. Example right pericaval 1.6 cm node on image 95 of series 5. Portal caval node of 3.3 x 2.6 cm on image 100 of series 5. Extensive retrocrural adenopathy, within measuring 12 mm on image 20 of series 7.  Other:  Moderate volume ascites.  Musculoskeletal: No acute osseous abnormality.  Exam degraded by mild motion and lack  of IV contrast.  IMPRESSION: 1. Large volume infiltrative tumor/tumors throughout the right lobe of the liver. Given the clinical history, presumably related to metastatic disease. Multifocal hepatocellular carcinoma could look similar. 2. Involvement of the portal vein by hepatic tumor and adjacent porta hepatis adenopathy. Given lack of IV contrast, is difficult to delineate whether this is secondary to compression or vascular extension of tumor. 3. Ascites. 4. Extensive abdominal and retrocrural nodal metastasis. 5. Decreased sensitivity and specificity exam due to technique related factors, as described above.   Electronically Signed   By: Abigail Miyamoto M.D.   On: 05/28/2014 09:27   US Renal  05/17/2014   CLINICAL DATA:  History of colon cancer and liposarcoma. Increased creatinine.  EXAM: RENAL/URINARY TRACT ULTRASOUND COMPLETE  COMPARISON:  None.  FINDINGS: Right Kidney:  Length: 11.3 cm. Echogenicity within normal limits. No mass or hydronephrosis visualized.  Left Kidney:  Length: 11.8 cm. Echogenicity within normal limits. No hydronephrosis visualized. There are simple cysts within the left kidney, largest in the upper pole left kidney measuring 1.5 cm.  Bladder:  Limited visualization.  Others: There is ascites.  IMPRESSION: No acute abnormality within the kidneys. Simple cysts within the left kidney. Ascites.   Electronically Signed   By: Abelardo Diesel M.D.   On: 05/17/2014 10:13   US Biopsy  05/31/2014   CLINICAL DATA:  Colon  carcinoma.  Multiple liver lesions.  EXAM: ULTRASOUND-GUIDED CORE LIVER BIOPSY  TECHNIQUE: An ultrasound guided liver biopsy was thoroughly discussed with the patient and questions were answered. The benefits, risks, alternatives, and complications were also discussed. The patient understands and wishes to proceed with the procedure. A verbal as well as written consent was obtained.  Survey ultrasound of the liver was performed, representative lesions localized, and an appropriate skin entry site was determined. Skin site was marked, prepped with Betadine, and draped in usual sterile fashion, and infiltrated locally with 1% lidocaine.  Intravenous Fentanyl and Versed were administered as conscious sedation during continuous cardiorespiratory monitoring by the radiology RN, with a total moderate sedation time of 5 minutes.  A 17 gauge trocar needle was advanced under ultrasound guidance into the liver to the margin of a representative lesion. 3 coaxial 18gauge core samples were then obtained through the guide needle. The guide needle was removed. Post procedure scans demonstrate no apparent complication.  FINDINGS: Multiple solid nodular liver masses again noted. Technically successful ultrasound core biopsy.  IMPRESSION: 1. Technically successful ultrasound guided core liver lesion biopsy.   Electronically Signed   By: Arne Cleveland M.D.   On: 05/31/2014 15:33   [place] Art/ven Flow Abd Pelv Doppler  05/31/2014   CLINICAL DATA:  Colon carcinoma.  Multiple liver lesions.  EXAM: DUPLEX ULTRASOUND OF LIVER  TECHNIQUE: Color and duplex Doppler ultrasound was performed to evaluate the hepatic in-flow and out-flow vessels.  COMPARISON:  MR 05/28/2014 and earlier studies  FINDINGS: Portal Vein Velocities  Main:  Occluded  Right:  Occluded  Left:  Occluded  Hepatic Vein Velocities (all hepatofugal)  Right:  48 cm/sec  Middle:  54 cm/sec  Left:  47 cm/sec  Hepatic Artery Velocity:  330 cm/sec  Splenic Vein Velocity:  17  cm/sec.  Spleen measures 9.8 x 13.1 x 4.2 cm, volume 284 cc.  Varices: None seen  Ascites: Moderate, present  IMPRESSION: 1. 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 2. Moderate abdominal ascites   Electronically Signed   By: Arne Cleveland M.D.   On: 05/31/2014 15:32  US Paracentesis  05/31/2014   CLINICAL DATA:  Patient with history of Lynch syndrome, skin cancer, prostate carcinoma, small bowel carcinoma, liposarcoma, right lobe liver tumor, recurrent ascites. Request is made for therapeutic paracentesis prior to liver biopsy.  EXAM: ULTRASOUND GUIDED THERAPEUTIC PARACENTESIS  COMPARISON:  PRIOR PARACENTESIS ON 05/25/2014  PROCEDURE: An ultrasound guided paracentesis was thoroughly discussed with the patient and questions answered. The benefits, risks, alternatives and complications were also discussed. The patient understands and wishes to proceed with the procedure. Written consent was obtained.  Ultrasound was performed to localize and mark an adequate pocket of fluid in the left lower quadrant of the abdomen. The area was then prepped and draped in the normal sterile fashion. 1% Lidocaine was used for local anesthesia. Under ultrasound guidance a 19 gauge Yueh catheter was introduced. Paracentesis was performed. The catheter was removed and a dressing applied.  Complications: None.  FINDINGS: A total of approximately 4.8 liters  of amber fluid was removed.  IMPRESSION: Successful ultrasound guided therapeutic paracentesis yielding 4.8 liters of ascites.  Read by: Rowe Robert, PA-C   Electronically Signed   By: Arne Cleveland M.D.   On: 05/31/2014 15:48   Ct Biopsy  05/26/2014   CLINICAL DATA:  68 year old with history of liposarcoma and small bowel resection. Recent CT demonstrated ascites, enlarged retroperitoneal lymph nodes and suspicious lesion in the right abdomen.  EXAM: CT-GUIDED BIOPSY OF A RIGHT ABDOMINAL LESION.  Physician: Stephan Minister. Anselm Pancoast, MD  MEDICATIONS: 2 mg versed,  200 mcg fentanyl. A radiology nurse monitored the patient for moderate sedation.  ANESTHESIA/SEDATION: Moderate sedation time: 20 min  PROCEDURE: The procedure was explained to the patient. The risks and benefits of the procedure were discussed and the patient's questions were addressed. Informed consent was obtained from the patient. Patient was initially placed supine on the CT scanner and images of the abdomen and pelvis were obtained. The lesion in the posterior right abdomen was targeted for biopsy. Patient was placed prone on the CT scanner. The right side of the abdomen was prepped with Betadine and a sterile drape was placed. 1% lidocaine was used for local anesthetic. A 17 gauge needle was directed into the lesion with CT guidance. Needle was placed along the posterior aspect of the lesion. A total of 5 core biopsies were performed with an 18 gauge device. Two adequate specimens were obtained. Specimens were placed in formalin. 17 gauge needle was removed without complication. Bandage placed over the puncture site.  FINDINGS: Patient has small-to-moderate amount of ascites despite recent paracentesis. There is diffuse low density of the right hepatic lobe compared to the left and this represents a change from the exam in 10/05/2013. Difficult to exclude lesions within the right hepatic lobe. Again noted are retroperitoneal lymph nodes in the periaortic and pericaval regions. Inferior to the right kidney, there is abnormal soft tissue roughly measuring 3.6 x 9.6 cm and this could represent a mesenteric or retroperitoneal mass. Question a low-density lesion near the uncinate process of the pancreas that is roughly similar from the exam on 10/12/2013. Focal fluid collection in the right lower quadrant probably related to ascites. Difficult to exclude peritoneal nodules versus pockets of fluid in the anterior lower abdomen and anterior pelvic region. Question a lymph node versus pocket of fluid in the right  hemipelvis on sequence 2, image 81  COMPLICATIONS: None  IMPRESSION: CT-guided core biopsies of a right abdominal mass.  Decreased attenuation of the right hepatic lobe and difficult to exclude  right hepatic lesions. Findings could be related to steatosis but could be better characterization with MRI.   Electronically Signed   By: Markus Daft M.D.   On: 05/26/2014 17:19   Labs:  CBC:  Recent Labs  05/24/14 1501 05/26/14 0717 05/27/14 1338 05/31/14 1150  WBC 20.6* 21.3* 21.7* 20.4*  HGB 11.0* 10.8* 11.0* 10.8*  HCT 33.4* 33.0* 35.2* 32.7*  PLT 400 413* 416* 349    COAGS:  Recent Labs  11/15/13 1259 12/15/13 0324 05/26/14 0717 05/31/14 1150  INR 1.08 1.24 1.27 1.32  APTT 36  --  39*  --     BMP:  Recent Labs  12/21/13 0405 12/22/13 0413  05/24/14 1501 05/26/14 0717 05/27/14 1338 05/31/14 1150  NA 135* 136*  < > 125* 129* 126* 123*  K 4.1 4.2  < > 6.4 No visable hemolysis* 6.2* 5.0 5.1  CL 95* 100  --   --  95*  --  89*  CO2 30 26  < > _0 18*  GLUCOSE 159* 158*  < > 157* 130* 211* 245*  BUN 13 11  < > 33.0* 34* 32.7* 40*  CALCIUM 8.6 8.8  < > 8.9 8.4 8.5 8.5  CREATININE 0.91 0.83  < > 2.0* 1.98* 1.8* 1.99*  GFRNONAA 86* 89*  --   --  33*  --  33*  GFRAA >90 >90  --   --  38*  --  38*  < > = values in this interval not displayed.  LIVER FUNCTION TESTS:  Recent Labs  11/16/13 1031 12/07/13 1105 05/16/14 1240 05/24/14 1501  BILITOT 0.33 0.2* 0.49 0.44  AST 20 29 81* 95*  ALT _1 ALKPHOS 152* 134* 393* 411*  PROT 7.3 7.4 8.0 6.8  ALBUMIN 3.2* 3.2* 2.8* 2.2*    TUMOR MARKERS:  Recent Labs  10/13/13 0450 11/16/13 1031 05/16/14 1240 05/24/14 1502 05/31/14 1150  AFPTM  --   --   --   --  1.7  CEA 5.8* 2.6 30.7* 46.5*  --     Assessment and Plan: Metastatic small bowel carcinoma  Scheduled today for image guided port a catheter placement Patient has been NPO, no blood thinners taken, labs reviewed Leukocytosis, afebrile, no signs  of active infection d/w Dr. Kathlene Cote and will proceed today, ancef ordered.  Risks and Benefits discussed with the patient. All of the patient's questions were answered, patient is agreeable to proceed. Consent signed and in chart.    SignedHedy Jacob 06/03/2014, 1:20 PM

## 2014-06-03 NOTE — Procedures (Signed)
Procedure:  Port placement Access:  Right IJ vein Findings:  SL PAC with tip at cavoatrial junction.  OK to use.  No PTX.

## 2014-06-06 ENCOUNTER — Ambulatory Visit (HOSPITAL_COMMUNITY)
Admission: RE | Admit: 2014-06-06 | Discharge: 2014-06-06 | Disposition: A | Discharge status: Home / Self Care | Payer: Medicare Other | Source: Ambulatory Visit | Attending: Oncology | Admitting: Oncology

## 2014-06-06 ENCOUNTER — Telehealth: Payer: Self-pay | Admitting: *Deleted

## 2014-06-06 ENCOUNTER — Telehealth: Payer: Self-pay | Admitting: Oncology

## 2014-06-06 ENCOUNTER — Other Ambulatory Visit: Payer: Self-pay | Admitting: *Deleted

## 2014-06-06 DIAGNOSIS — C179 Malignant neoplasm of small intestine, unspecified: Secondary | ICD-10-CM

## 2014-06-06 DIAGNOSIS — C61 Malignant neoplasm of prostate: Secondary | ICD-10-CM | POA: Insufficient documentation

## 2014-06-06 DIAGNOSIS — C189 Malignant neoplasm of colon, unspecified: Secondary | ICD-10-CM | POA: Insufficient documentation

## 2014-06-06 DIAGNOSIS — M542 Cervicalgia: Secondary | ICD-10-CM | POA: Insufficient documentation

## 2014-06-06 MED ORDER — OXYCODONE-ACETAMINOPHEN 5-325 MG PO TABS: 1.0000 | ORAL_TABLET | Freq: Four times a day (QID) | ORAL | Status: DC | PRN

## 2014-06-06 NOTE — Telephone Encounter (Signed)
Pt called and left message requesting paracentesis.  Spoke with pt and was informed that his abdomen was distended again and pt has mild short of breath.  Pt wanted to know if he could get paracentesis done either tomorrow after renal appt , or on Wednesday after first time chemo.  Leland Johns, NP notified.  Spoke with Malachy Mood in radiology.  Called pt back and informed pt that appt for paracentesis is scheduled for 10 am 06/07/14.  Pt to arrive to radiology at 0945.  Pt voiced understanding. Pt's  Phone    [phone].

## 2014-06-06 NOTE — Telephone Encounter (Signed)
Pt called and left message requesting refill of Oxycodone meds.  Pt stated he has one day left of pain meds.  Cindee, NP notified.

## 2014-06-06 NOTE — Telephone Encounter (Signed)
Pt appt. To see Dr. Reynaldo Minium @ [facility] is 06/16/14@ 11:00. Medical records faxed, slides and scans will be fedex'ed

## 2014-06-06 NOTE — Addendum Note (Signed)
Addended by: Drue Second R on: 06/06/2014 02:48 PM   Modules accepted: Orders

## 2014-06-07 ENCOUNTER — Ambulatory Visit (HOSPITAL_COMMUNITY)
Admission: RE | Admit: 2014-06-07 | Discharge: 2014-06-07 | Disposition: A | Discharge status: Home / Self Care | Payer: Medicare Other | Source: Ambulatory Visit | Attending: Nurse Practitioner | Admitting: Nurse Practitioner

## 2014-06-07 DIAGNOSIS — C189 Malignant neoplasm of colon, unspecified: Secondary | ICD-10-CM | POA: Diagnosis not present

## 2014-06-07 DIAGNOSIS — R188 Other ascites: Secondary | ICD-10-CM | POA: Diagnosis not present

## 2014-06-07 NOTE — Procedures (Signed)
Successful US guided paracentesis from [facility].  Yielded 4.3 liters of yellow/amber fluid.  No immediate complications.  Pt tolerated well.   Specimen was not sent for labs.  Tsosie Billing D PA-C 06/07/2014 11:13 AM

## 2014-06-08 ENCOUNTER — Ambulatory Visit (HOSPITAL_BASED_OUTPATIENT_CLINIC_OR_DEPARTMENT_OTHER): Payer: Medicare Other | Admitting: Oncology

## 2014-06-08 ENCOUNTER — Telehealth: Payer: Self-pay | Admitting: *Deleted

## 2014-06-08 ENCOUNTER — Other Ambulatory Visit (HOSPITAL_BASED_OUTPATIENT_CLINIC_OR_DEPARTMENT_OTHER): Payer: Medicare Other

## 2014-06-08 ENCOUNTER — Ambulatory Visit (HOSPITAL_BASED_OUTPATIENT_CLINIC_OR_DEPARTMENT_OTHER): Payer: Medicare Other

## 2014-06-08 ENCOUNTER — Telehealth: Payer: Self-pay | Admitting: Oncology

## 2014-06-08 ENCOUNTER — Ambulatory Visit: Payer: Medicare Other

## 2014-06-08 VITALS — BP 129/56 | HR 106 | Temp 97.7°F | Resp 18 | Ht 71.0 in | Wt 187.3 lb

## 2014-06-08 DIAGNOSIS — Z95828 Presence of other vascular implants and grafts: Secondary | ICD-10-CM

## 2014-06-08 DIAGNOSIS — C179 Malignant neoplasm of small intestine, unspecified: Secondary | ICD-10-CM

## 2014-06-08 DIAGNOSIS — C189 Malignant neoplasm of colon, unspecified: Secondary | ICD-10-CM

## 2014-06-08 DIAGNOSIS — Z5111 Encounter for antineoplastic chemotherapy: Secondary | ICD-10-CM

## 2014-06-08 DIAGNOSIS — N19 Unspecified kidney failure: Secondary | ICD-10-CM

## 2014-06-08 LAB — CBC WITH DIFFERENTIAL/PLATELET
BASO%: 0.5 % (ref 0.0–2.0)
BASOS ABS: 0.1 10*3/uL (ref 0.0–0.1)
EOS ABS: 0.2 10*3/uL (ref 0.0–0.5)
EOS%: 0.9 % (ref 0.0–7.0)
HCT: 32.4 % — ABNORMAL LOW (ref 38.4–49.9)
HEMOGLOBIN: 10.3 g/dL — AB (ref 13.0–17.1)
LYMPH%: 6.6 % — AB (ref 14.0–49.0)
MCH: 25.6 pg — AB (ref 27.2–33.4)
MCHC: 31.8 g/dL — ABNORMAL LOW (ref 32.0–36.0)
MCV: 80.6 fL (ref 79.3–98.0)
MONO#: 1.4 10*3/uL — ABNORMAL HIGH (ref 0.1–0.9)
MONO%: 6.5 % (ref 0.0–14.0)
NEUT%: 85.5 % — ABNORMAL HIGH (ref 39.0–75.0)
NEUTROS ABS: 17.9 10*3/uL — AB (ref 1.5–6.5)
PLATELETS: 442 10*3/uL — AB (ref 140–400)
RBC: 4.01 10*6/uL — ABNORMAL LOW (ref 4.20–5.82)
RDW: 18.6 % — ABNORMAL HIGH (ref 11.0–14.6)
WBC: 20.9 10*3/uL — ABNORMAL HIGH (ref 4.0–10.3)
lymph#: 1.4 10*3/uL (ref 0.9–3.3)

## 2014-06-08 LAB — COMPREHENSIVE METABOLIC PANEL (CC13)
ALT: 13 U/L (ref 0–55)
AST: 70 U/L — AB (ref 5–34)
Albumin: 1.7 g/dL — ABNORMAL LOW (ref 3.5–5.0)
Alkaline Phosphatase: 536 U/L — ABNORMAL HIGH (ref 40–150)
Anion Gap: 8 mEq/L (ref 3–11)
BILIRUBIN TOTAL: 0.44 mg/dL (ref 0.20–1.20)
BUN: 40.7 mg/dL — ABNORMAL HIGH (ref 7.0–26.0)
CO2: 18 mEq/L — ABNORMAL LOW (ref 22–29)
CREATININE: 1.9 mg/dL — AB (ref 0.7–1.3)
Calcium: 8.3 mg/dL — ABNORMAL LOW (ref 8.4–10.4)
Chloride: 97 mEq/L — ABNORMAL LOW (ref 98–109)
Glucose: 133 mg/dl (ref 70–140)
Potassium: 5.7 mEq/L — ABNORMAL HIGH (ref 3.5–5.1)
Sodium: 123 mEq/L — ABNORMAL LOW (ref 136–145)
Total Protein: 6.2 g/dL — ABNORMAL LOW (ref 6.4–8.3)

## 2014-06-08 MED ORDER — DEXAMETHASONE SODIUM PHOSPHATE 10 MG/ML IJ SOLN
10.0000 mg | Freq: Once | INTRAMUSCULAR | Status: AC
  Administered 2014-06-08: 10 mg via INTRAVENOUS

## 2014-06-08 MED ORDER — ONDANSETRON 8 MG/50ML IVPB (CHCC)
8.0000 mg | Freq: Once | INTRAVENOUS | Status: AC
  Administered 2014-06-08: 8 mg via INTRAVENOUS

## 2014-06-08 MED ORDER — DEXTROSE 5 % IV SOLN
Freq: Once | INTRAVENOUS | Status: AC
  Administered 2014-06-08: 11:00:00 via INTRAVENOUS

## 2014-06-08 MED ORDER — SODIUM CHLORIDE 0.9 % IJ SOLN
10.0000 mL | INTRAMUSCULAR | Status: DC | PRN
  Administered 2014-06-08: 10 mL via INTRAVENOUS
  Filled 2014-06-08: qty 10

## 2014-06-08 MED ORDER — FLUOROURACIL CHEMO INJECTION 2.5 GM/50ML
400.0000 mg/m2 | Freq: Once | INTRAVENOUS | Status: AC
  Administered 2014-06-08: 800 mg via INTRAVENOUS
  Filled 2014-06-08: qty 16

## 2014-06-08 MED ORDER — DEXAMETHASONE SODIUM PHOSPHATE 10 MG/ML IJ SOLN
INTRAMUSCULAR | Status: AC
  Filled 2014-06-08: qty 1

## 2014-06-08 MED ORDER — DEXTROSE 5 % IV SOLN
400.0000 mg/m2 | Freq: Once | INTRAVENOUS | Status: AC
  Administered 2014-06-08: 824 mg via INTRAVENOUS
  Filled 2014-06-08: qty 41.2

## 2014-06-08 MED ORDER — SODIUM CHLORIDE 0.9 % IV SOLN
2400.0000 mg/m2 | INTRAVENOUS | Status: DC
  Administered 2014-06-08: 4950 mg via INTRAVENOUS
  Filled 2014-06-08: qty 99

## 2014-06-08 MED ORDER — OXALIPLATIN CHEMO INJECTION 100 MG/20ML
85.0000 mg/m2 | Freq: Once | INTRAVENOUS | Status: AC
  Administered 2014-06-08: 175 mg via INTRAVENOUS
  Filled 2014-06-08: qty 35

## 2014-06-08 MED ORDER — ONDANSETRON 8 MG/NS 50 ML IVPB
INTRAVENOUS | Status: AC
  Filled 2014-06-08: qty 8

## 2014-06-08 NOTE — Progress Notes (Signed)
Ok to treat per MD 

## 2014-06-08 NOTE — Patient Instructions (Signed)

## 2014-06-08 NOTE — Patient Instructions (Addendum)
[facility] Discharge Instructions for Patients Receiving Chemotherapy  Today you received the following chemotherapy agents Leucovorin, Oxaliplatin, Adrucil.  To help prevent nausea and vomiting after your treatment, we encourage you to take your nausea medication as directed.    If you develop nausea and vomiting that is not controlled by your nausea medication, call the clinic.   BELOW ARE SYMPTOMS THAT SHOULD BE REPORTED IMMEDIATELY:  *FEVER GREATER THAN 100.5 F  *CHILLS WITH OR WITHOUT FEVER  NAUSEA AND VOMITING THAT IS NOT CONTROLLED WITH YOUR NAUSEA MEDICATION  *UNUSUAL SHORTNESS OF BREATH  *UNUSUAL BRUISING OR BLEEDING  TENDERNESS IN MOUTH AND THROAT WITH OR WITHOUT PRESENCE OF ULCERS  *URINARY PROBLEMS  *BOWEL PROBLEMS  UNUSUAL RASH Items with * indicate a potential emergency and should be followed up as soon as possible.  Feel free to call the clinic you have any questions or concerns. The clinic phone number is [phone].

## 2014-06-08 NOTE — Telephone Encounter (Signed)
Per staff message and POF I have scheduled appts. Advised scheduler of appts. JMW  

## 2014-06-08 NOTE — Progress Notes (Signed)
[facility] OFFICE PROGRESS NOTE   Diagnosis: Small bowel carcinoma  INTERVAL HISTORY:   [name] returns as scheduled. He underwent a paracentesis yesterday. A Port-A-Cath was placed 06/03/2014. He was seen by nephrology yesterday. He has pain in the right flank region. He continues to have bilateral leg edema. [name] has attended a chemotherapy teaching class.  Objective:  Vital signs in last 24 hours:  Blood pressure 129/56, pulse 106, temperature 97.7 F (36.5 C), temperature source Oral, resp. rate 18, height _0  (1.803 m), weight 187 lb 4.8 oz (84.959 kg), SpO2 100.00%.    Resp: Lungs clear bilaterally Cardio: Regular rate and rhythm GI: The liver is palpable in the right upper abdomen. Vascular: 1+ pitting edema below the knee bilaterally   Portacath/PICC-without erythema  Lab Results:  Lab Results  Component Value Date   WBC 20.9* 06/08/2014   HGB 10.3* 06/08/2014   HCT 32.4* 06/08/2014   MCV 80.6 06/08/2014   PLT 442* 06/08/2014   NEUTROABS 17.9* 06/08/2014   Potassium 5.7, creatinine 1.9, BUN 40.7   Lab Results  Component Value Date   CEA 46.5* 05/24/2014    Imaging:  Ct Cervical Spine Wo Contrast  06/06/2014   CLINICAL DATA:  Neck pain. Colon cancer, prostate cancer and sarcoma cancer and sarcoma.  EXAM: CT CERVICAL SPINE WITHOUT CONTRAST  TECHNIQUE: Multidetector CT imaging of the cervical spine was performed without intravenous contrast. Multiplanar CT image reconstructions were also generated.  COMPARISON:  None.  FINDINGS: Straightening of the normal cervical lordosis. Atlantodental degenerative disease. Subchondral cyst present in the anterior odontoid. No destructive osseous lesions. No cervical spine fracture. Paraspinal soft tissues demonstrate atherosclerosis. RIGHT IJ catheter corresponds with previously seen Port-A-Cath. Multilevel disc osteophyte complexes are present. Craniocervical junction is normal.  Multilevel  disc degeneration and desiccation is present with loss of disc height most pronounced at C5-C6 and C6-C7.  C2-C3:  RIGHT facet arthrosis.  No stenosis.  C3-C4: Mild central stenosis with disc osteophyte complex. RIGHT facet arthrosis with hypertrophic bone extending off the dorsal aspect of the C3 inferior articular process. LEFT foraminal stenosis due to facet arthrosis and uncovertebral spurring.  C4-C5: Mild central stenosis associated with disc osteophyte complex. Bilateral foraminal stenosis due to uncovertebral spurring and facet arthrosis.  C5-C6: Mild central stenosis with broad-based disc osteophyte complex. Neural foramina appear patent. RIGHT facet arthrosis.  C6-C7: Bilateral foraminal stenosis due to uncovertebral spurring. Severe disc degeneration with broad-based disc osteophyte complex.  C7-T1:  Negative.  IMPRESSION: 1. No evidence of metastatic disease to the cervical spine. 2. Multilevel cervical spine degenerative disease detailed above.   Electronically Signed   By: Dereck Ligas M.D.   On: 06/06/2014 16:03   US Paracentesis  06/07/2014   INDICATION: Metastatic small bowel carcinoma, recurrent ascites, request for paracentesis.  EXAM: ULTRASOUND-GUIDED PARACENTESIS  COMPARISON:  None.  MEDICATIONS: None.  COMPLICATIONS: None immediate  TECHNIQUE: Informed written consent was obtained from the patient after a discussion of the risks, benefits and alternatives to treatment. A timeout was performed prior to the initiation of the procedure.  Initial ultrasound scanning demonstrates a large amount of ascites within the right lower abdominal quadrant. The left lower abdomen was prepped and draped in the usual sterile fashion. 1% lidocaine was used for local anesthesia. Under direct ultrasound guidance, a 19 gauge, 7-cm, Yueh catheter was introduced. An ultrasound image was saved for documentation purposed. The paracentesis was performed. The catheter was removed and a dressing was applied. The  patient tolerated the procedure well without immediate post procedural complication.  FINDINGS: A total of approximately 4.3 liters of serous fluid was removed.  IMPRESSION: Successful ultrasound-guided paracentesis yielding 4.3 liters of peritoneal fluid.  Read By:  Tsosie Billing PA-C   Electronically Signed   By: Aletta Edouard M.D.   On: 06/07/2014 11:16    Medications: I have reviewed the patient's current medications.  Assessment/Plan: 1. Adenocarcinoma of the small bowel, stage III (T3 N1), status post a partial small bowel resection 10/14/2013  The tumor returned microsatellite instability high with loss of MSH2 expression  MRI of the liver 05/28/2014 with multiple hepatic metastases and portal/retroperitoneal lymphadenopathy, ultrasound-guided biopsy of the liver 05/31/2014 confirmed metastatic small bowel carcinoma Cycle 1 FOLFOX 06/08/2014 2. Colon cancer X3, 1997 and 1999, status post an intra-abdominal colectomy with ileorectal anastomosis in May 1999  3. Iron deficiency anemia  4. Diabetes  5. Hypothyroidism  6. Hypertension  7. 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 Ascites-likely secondary to portal hypertension versus carcinomatosis, repeat peritoneal fluid cytologies negative for tumor cells  10. 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 Right neck pain-most likely secondary to a benign musculoskeletal condition, CT of the neck 06/06/2014 with degenerative changes, no evidence of metastatic disease 12. 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 [name] appears stable. The plan is to proceed with FOLFOX chemotherapy today. He has attended a chemotherapy teaching class. We will reviewed the indication for Kayexalate and Lasix with nephrology.  Mr. Wyndham will return for an office visit and cycle 2 FOLFOX in 2 weeks. He will contact us in the interim for recurrent ascites or new symptoms.  Betsy Coder, MD  06/08/2014  1:21 PM

## 2014-06-09 ENCOUNTER — Other Ambulatory Visit: Payer: Self-pay | Admitting: Dermatology

## 2014-06-09 ENCOUNTER — Other Ambulatory Visit: Payer: Self-pay | Admitting: *Deleted

## 2014-06-09 ENCOUNTER — Telehealth: Payer: Self-pay | Admitting: *Deleted

## 2014-06-09 DIAGNOSIS — C189 Malignant neoplasm of colon, unspecified: Secondary | ICD-10-CM

## 2014-06-09 MED ORDER — PROCHLORPERAZINE MALEATE 10 MG PO TABS: 10.0000 mg | ORAL_TABLET | Freq: Four times a day (QID) | ORAL | Status: AC | PRN

## 2014-06-09 MED ORDER — DEXAMETHASONE 4 MG PO TABS: 8.0000 mg | ORAL_TABLET | Freq: Two times a day (BID) | ORAL | Status: AC

## 2014-06-09 NOTE — Telephone Encounter (Signed)
Message copied by Cherylynn Ridges on Thu Jun 09, 2014 12:38 PM ------      Message from: Kellie Simmering A      Created: Wed Jun 08, 2014  3:50 PM      Regarding: 1st treatment       1st treatment/FOLFOX/ Dr. Benay Spice             [phone])      [phone] (H)             ------

## 2014-06-09 NOTE — Telephone Encounter (Signed)
Chemotherapy follow up information shared with Dr. Benay Spice.  Verbal order received and read back from Dr. Benay Spice for dexamethasone 8 mg twice a day x 2 days.  Compazine 10 mg po q 6 hrs prn nausea.  Called patient with instructions for these new medicines.  Asked that he call office if no relief of nausea, vomiting.

## 2014-06-09 NOTE — Telephone Encounter (Signed)
Return call received from Dean Owens.  Reports he is "doing well but would like to double up on the zofran 8 mg he takes every 8 hours.  I'm throwing up".  Has eaten but threw up a few hours after eating.  Threw up at 10:00, 12:00 and at 2:00 today.  Last BM was at 0700 this morning.  Took kayexalate for bowels.  Denies any further symptoms.  Uses [facility] on [address].  Return call to him at [phone].

## 2014-06-09 NOTE — Telephone Encounter (Signed)
Called Dean Owens at [phone] mobile number(s).  Message left requesting a return call for chemotherapy follow up.  Awaiting return call from patient.

## 2014-06-10 ENCOUNTER — Telehealth: Payer: Self-pay | Admitting: *Deleted

## 2014-06-10 ENCOUNTER — Ambulatory Visit (HOSPITAL_BASED_OUTPATIENT_CLINIC_OR_DEPARTMENT_OTHER): Payer: Medicare Other

## 2014-06-10 DIAGNOSIS — C179 Malignant neoplasm of small intestine, unspecified: Secondary | ICD-10-CM

## 2014-06-10 MED ORDER — HEPARIN SOD (PORK) LOCK FLUSH 100 UNIT/ML IV SOLN
500.0000 [IU] | Freq: Once | INTRAVENOUS | Status: AC | PRN
Start: 2014-06-10 — End: 2014-06-10
  Administered 2014-06-10: 500 [IU]
  Filled 2014-06-10: qty 5

## 2014-06-10 MED ORDER — SODIUM CHLORIDE 0.9 % IJ SOLN
10.0000 mL | INTRAMUSCULAR | Status: DC | PRN
  Administered 2014-06-10: 10 mL
  Filled 2014-06-10: qty 10

## 2014-06-10 NOTE — Telephone Encounter (Signed)
Received message from pt asking to "get scans on disc so I can take them to [facility] and spoke with pt and notified him Radiology will have disc ready today by 3pm.  Pt verbalized understanding of information.

## 2014-06-13 ENCOUNTER — Ambulatory Visit (HOSPITAL_BASED_OUTPATIENT_CLINIC_OR_DEPARTMENT_OTHER): Payer: Medicare Other

## 2014-06-13 ENCOUNTER — Other Ambulatory Visit: Payer: Self-pay | Admitting: *Deleted

## 2014-06-13 ENCOUNTER — Telehealth: Payer: Self-pay | Admitting: *Deleted

## 2014-06-13 ENCOUNTER — Telehealth: Payer: Self-pay | Admitting: Oncology

## 2014-06-13 ENCOUNTER — Other Ambulatory Visit: Payer: Self-pay | Admitting: Nurse Practitioner

## 2014-06-13 ENCOUNTER — Other Ambulatory Visit: Payer: Medicare Other

## 2014-06-13 VITALS — BP 140/83 | HR 101 | Temp 97.3°F

## 2014-06-13 DIAGNOSIS — R188 Other ascites: Secondary | ICD-10-CM

## 2014-06-13 DIAGNOSIS — Z95828 Presence of other vascular implants and grafts: Secondary | ICD-10-CM

## 2014-06-13 DIAGNOSIS — C189 Malignant neoplasm of colon, unspecified: Secondary | ICD-10-CM

## 2014-06-13 DIAGNOSIS — Z452 Encounter for adjustment and management of vascular access device: Secondary | ICD-10-CM

## 2014-06-13 DIAGNOSIS — C179 Malignant neoplasm of small intestine, unspecified: Secondary | ICD-10-CM

## 2014-06-13 MED ORDER — FUROSEMIDE 20 MG PO TABS: 20.0000 mg | ORAL_TABLET | ORAL | Status: DC

## 2014-06-13 MED ORDER — HEPARIN SOD (PORK) LOCK FLUSH 100 UNIT/ML IV SOLN
500.0000 [IU] | Freq: Once | INTRAVENOUS | Status: AC
  Administered 2014-06-13: 500 [IU] via INTRAVENOUS
  Filled 2014-06-13: qty 5

## 2014-06-13 MED ORDER — OXYCODONE-ACETAMINOPHEN 5-325 MG PO TABS: 1.0000 | ORAL_TABLET | Freq: Four times a day (QID) | ORAL | Status: DC | PRN

## 2014-06-13 MED ORDER — SODIUM CHLORIDE 0.9 % IJ SOLN
10.0000 mL | INTRAMUSCULAR | Status: DC | PRN
  Administered 2014-06-13: 10 mL via INTRAVENOUS
  Filled 2014-06-13: qty 10

## 2014-06-13 NOTE — Telephone Encounter (Signed)
Pt called and left message requesting a paracentesis to be done.  Cindee, NP symptom management notified.  Called pt back and spoke with wife.  Informed wife that pt has appt for paracentesis at 1245 pm  06/14/14  At  [facility] hospital.  Wife voiced understanding.

## 2014-06-13 NOTE — Patient Instructions (Signed)

## 2014-06-13 NOTE — Telephone Encounter (Signed)
Pt's wife came in w/order from [place], needing labs/flush today due to on 10/16 pt went to [facility] for labs but were not able to do them due to no RN to do labs through the port...Marland KitchenMarland KitchenMarland Kitchen Wife confirmed labs/flush for today, spk w/Tiffany to advise ok for labs/flush per order, pt does have copy of order..... KJ

## 2014-06-13 NOTE — Telephone Encounter (Signed)
Called pt at home and spoke with wife.  Informed wife that two prescriptions are ready for pt to pick up.  Wife voiced understanding.

## 2014-06-13 NOTE — Telephone Encounter (Signed)
Pt called and requested refills of Oxycodone and Lasix.  Message relayed to [place], NP.

## 2014-06-14 ENCOUNTER — Ambulatory Visit (HOSPITAL_COMMUNITY)
Admission: RE | Admit: 2014-06-14 | Discharge: 2014-06-14 | Disposition: A | Discharge status: Home / Self Care | Payer: Medicare Other | Source: Ambulatory Visit | Attending: Nurse Practitioner | Admitting: Nurse Practitioner

## 2014-06-14 DIAGNOSIS — C61 Malignant neoplasm of prostate: Secondary | ICD-10-CM | POA: Diagnosis not present

## 2014-06-14 DIAGNOSIS — C179 Malignant neoplasm of small intestine, unspecified: Secondary | ICD-10-CM | POA: Diagnosis not present

## 2014-06-14 DIAGNOSIS — R188 Other ascites: Secondary | ICD-10-CM | POA: Insufficient documentation

## 2014-06-14 NOTE — Procedures (Signed)
US guided therapeutic paracentesis performed yielding 6.3 liters blood-tinged fluid. No immediate complications.

## 2014-06-19 ENCOUNTER — Other Ambulatory Visit: Payer: Self-pay | Admitting: Oncology

## 2014-06-22 ENCOUNTER — Other Ambulatory Visit (HOSPITAL_BASED_OUTPATIENT_CLINIC_OR_DEPARTMENT_OTHER): Payer: Medicare Other

## 2014-06-22 ENCOUNTER — Telehealth: Payer: Self-pay | Admitting: Oncology

## 2014-06-22 ENCOUNTER — Ambulatory Visit (HOSPITAL_BASED_OUTPATIENT_CLINIC_OR_DEPARTMENT_OTHER): Payer: Medicare Other

## 2014-06-22 ENCOUNTER — Other Ambulatory Visit: Payer: Self-pay | Admitting: *Deleted

## 2014-06-22 ENCOUNTER — Inpatient Hospital Stay: Payer: Medicare Other

## 2014-06-22 ENCOUNTER — Ambulatory Visit (HOSPITAL_BASED_OUTPATIENT_CLINIC_OR_DEPARTMENT_OTHER): Payer: Medicare Other | Admitting: Oncology

## 2014-06-22 VITALS — BP 119/65 | HR 85 | Temp 98.4°F | Resp 19 | Ht 71.0 in | Wt 172.8 lb

## 2014-06-22 DIAGNOSIS — D701 Agranulocytosis secondary to cancer chemotherapy: Secondary | ICD-10-CM

## 2014-06-22 DIAGNOSIS — D509 Iron deficiency anemia, unspecified: Secondary | ICD-10-CM

## 2014-06-22 DIAGNOSIS — C179 Malignant neoplasm of small intestine, unspecified: Secondary | ICD-10-CM

## 2014-06-22 DIAGNOSIS — C787 Secondary malignant neoplasm of liver and intrahepatic bile duct: Secondary | ICD-10-CM

## 2014-06-22 DIAGNOSIS — C189 Malignant neoplasm of colon, unspecified: Secondary | ICD-10-CM

## 2014-06-22 DIAGNOSIS — Z452 Encounter for adjustment and management of vascular access device: Secondary | ICD-10-CM

## 2014-06-22 DIAGNOSIS — Z95828 Presence of other vascular implants and grafts: Secondary | ICD-10-CM

## 2014-06-22 LAB — COMPREHENSIVE METABOLIC PANEL (CC13)
ALT: 25 U/L (ref 0–55)
ANION GAP: 5 meq/L (ref 3–11)
AST: 24 U/L (ref 5–34)
Albumin: 1.8 g/dL — ABNORMAL LOW (ref 3.5–5.0)
Alkaline Phosphatase: 460 U/L — ABNORMAL HIGH (ref 40–150)
BUN: 24.6 mg/dL (ref 7.0–26.0)
CHLORIDE: 105 meq/L (ref 98–109)
CO2: 20 meq/L — AB (ref 22–29)
CREATININE: 1 mg/dL (ref 0.7–1.3)
Calcium: 8.5 mg/dL (ref 8.4–10.4)
GLUCOSE: 108 mg/dL (ref 70–140)
Potassium: 4.7 mEq/L (ref 3.5–5.1)
Sodium: 129 mEq/L — ABNORMAL LOW (ref 136–145)
Total Bilirubin: 0.43 mg/dL (ref 0.20–1.20)
Total Protein: 5.6 g/dL — ABNORMAL LOW (ref 6.4–8.3)

## 2014-06-22 LAB — CBC WITH DIFFERENTIAL/PLATELET
BASO%: 3.1 % — ABNORMAL HIGH (ref 0.0–2.0)
BASOS ABS: 0 10*3/uL (ref 0.0–0.1)
EOS ABS: 0.1 10*3/uL (ref 0.0–0.5)
EOS%: 6.9 % (ref 0.0–7.0)
HEMATOCRIT: 23.3 % — AB (ref 38.4–49.9)
HEMOGLOBIN: 7.5 g/dL — AB (ref 13.0–17.1)
LYMPH%: 41.2 % (ref 14.0–49.0)
MCH: 24.9 pg — ABNORMAL LOW (ref 27.2–33.4)
MCHC: 32.2 g/dL (ref 32.0–36.0)
MCV: 77.4 fL — AB (ref 79.3–98.0)
MONO#: 0.5 10*3/uL (ref 0.1–0.9)
MONO%: 41.2 % — AB (ref 0.0–14.0)
NEUT%: 7.6 % — AB (ref 39.0–75.0)
NEUTROS ABS: 0.1 10*3/uL — AB (ref 1.5–6.5)
NRBC: 0 % (ref 0–0)
PLATELETS: 197 10*3/uL (ref 140–400)
RBC: 3.01 10*6/uL — AB (ref 4.20–5.82)
RDW: 18.2 % — ABNORMAL HIGH (ref 11.0–14.6)
WBC: 1.3 10*3/uL — ABNORMAL LOW (ref 4.0–10.3)
lymph#: 0.5 10*3/uL — ABNORMAL LOW (ref 0.9–3.3)

## 2014-06-22 MED ORDER — ONDANSETRON HCL 8 MG PO TABS: 8.0000 mg | ORAL_TABLET | Freq: Three times a day (TID) | ORAL | Status: DC | PRN

## 2014-06-22 MED ORDER — SODIUM CHLORIDE 0.9 % IJ SOLN
10.0000 mL | INTRAMUSCULAR | Status: DC | PRN
  Administered 2014-06-22: 10 mL via INTRAVENOUS
  Filled 2014-06-22: qty 10

## 2014-06-22 MED ORDER — CIPROFLOXACIN HCL 250 MG PO TABS: 250.0000 mg | ORAL_TABLET | Freq: Two times a day (BID) | ORAL | Status: DC

## 2014-06-22 MED ORDER — HEPARIN SOD (PORK) LOCK FLUSH 100 UNIT/ML IV SOLN
500.0000 [IU] | Freq: Once | INTRAVENOUS | Status: AC
  Administered 2014-06-22: 500 [IU] via INTRAVENOUS
  Filled 2014-06-22: qty 5

## 2014-06-22 MED ORDER — OXYCODONE-ACETAMINOPHEN 5-325 MG PO TABS: 1.0000 | ORAL_TABLET | Freq: Four times a day (QID) | ORAL | Status: DC | PRN

## 2014-06-22 NOTE — Progress Notes (Signed)
[facility] OFFICE PROGRESS NOTE   Diagnosis: Small bowel carcinoma  INTERVAL HISTORY:   Mr. Vanauken returns as scheduled. He completed cycle 1 FOLFOX on 06/08/2014. He had nausea and vomiting the day following chemotherapy. No neuropathy symptoms. He reports intermittent nosebleeds. The abdominal distention is much improved. He last underwent a paracentesis 06/14/2014 for 6.3 L of fluid.  He saw Dr. Reynaldo Minium and will be considered for the PD-1 trial at [facility] if he does not respond of FOLFOX.  Objective:  Vital signs in last 24 hours:  Blood pressure 119/65, pulse 85, temperature 98.4 F (36.9 C), temperature source Oral, resp. rate 19, height 5' 11"  (1.803 m), weight 172 lb 12.8 oz (78.382 kg).    HEENT: No thrush or ulcers Resp: Lungs with rhonchi at the left greater than right base, no respiratory distress Cardio: Regular rate and rhythm GI: No hepatomegaly, no apparent ascites Vascular: 1+ pitting edema at the lower leg bilaterally Skin: Multiple excoriations at the dorsum of the hands and lower forearm bilaterally   Portacath/PICC-without erythema  Lab Results:  Lab Results  Component Value Date   WBC 1.3* 06/22/2014   HGB 7.5* 06/22/2014   HCT 23.3* 06/22/2014   MCV 77.4* 06/22/2014   PLT 197 06/22/2014   NEUTROABS 0.1* 06/22/2014   Potassium 4.7, creatinine 1.0, albumin 1.8, bilirubin 0.43, sodium 129, chloride 105 Imaging:  No results found.  Medications: I have reviewed the patient's current medications.  Assessment/Plan: 1. Adenocarcinoma of the small bowel, stage III (T3 N1), status post a partial small bowel resection 10/14/2013  The tumor returned microsatellite instability high with loss of MSH2 expression  MRI of the liver 05/28/2014 with multiple hepatic metastases and portal/retroperitoneal lymphadenopathy, ultrasound-guided biopsy of the liver 05/31/2014 confirmed metastatic small bowel carcinoma  Cycle 1 FOLFOX 06/08/2014 2.  Colon cancer X3, 1997 and 1999, status post an intra-abdominal colectomy with ileorectal anastomosis in May 1999  3. Iron deficiency anemia  4. Diabetes  5. Hypothyroidism  6. Hypertension  7. 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 Ascites-likely secondary to portal hypertension versus carcinomatosis, repeat peritoneal fluid cytologies negative for tumor cells  10. 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 Right neck pain-most likely secondary to a benign musculoskeletal condition, CT of the neck 06/06/2014 with degenerative changes, no evidence of metastatic disease  12. Renal failure-likely related to carcinomatosis and hepatic metastases, he saw nephrology 06/07/2014, improved today 13. Nausea and vomiting following cycle 1 FOLFOX 14. Severe neutropenia secondary to chemotherapy   Disposition:  Mr. Grassia completed one cycle of FOLFOX chemotherapy. The chemotherapy was complicated by delayed nausea and vomiting. He has developed severe neutropenia. The antiemetic regimen will be adjusted with cycle 2. Chemotherapy will be held today secondary to the severe neutropenia. He will be placed on prophylactic ciprofloxacin. He knows to contact us for a fever or symptoms of infection. Mr. Portell will return for a lab visit  06/27/2014. He will be scheduled for FOLFOX 06/29/2014. The ascites appears improved. This may be related to a response from chemotherapy or development of collateral blood flow.  He will receive Neulasta  with cycle 2. We  reviewed potential toxicities associated with Neulasta.   Betsy Coder, MD  06/22/2014  1:53 PM

## 2014-06-22 NOTE — Patient Instructions (Signed)
Neutropenia Neutropenia is a condition that occurs when the level of a certain type of white blood cell (neutrophil) in your body becomes lower than normal. Neutrophils are made in the bone marrow and fight infections. These cells protect against bacteria and viruses. The fewer neutrophils you have, and the longer your body remains without them, the greater your risk of getting a severe infection becomes. CAUSES  The cause of neutropenia may be hard to determine. However, it is usually due to 3 main problems:   Decreased production of neutrophils. This may be due to:  Certain medicines such as chemotherapy.  Genetic problems.  Cancer.  Radiation treatments.  Vitamin deficiency.  Some pesticides.  Increased destruction of neutrophils. This may be due to:  Overwhelming infections.  Hemolytic anemia. This is when the body destroys its own blood cells.  Chemotherapy.  Neutrophils moving to areas of the body where they cannot fight infections. This may be due to:  Dialysis procedures.  Conditions where the spleen becomes enlarged. Neutrophils are held in the spleen and are not available to the rest of the body.  Overwhelming infections. The neutrophils are held in the area of the infection and are not available to the rest of the body. SYMPTOMS  There are no specific symptoms of neutropenia. The lack of neutrophils can result in an infection, and an infection can cause various problems. DIAGNOSIS  Diagnosis is made by a blood test. A complete blood count is performed. The normal level of neutrophils in human blood differs with age and race. Infants have lower counts than older children and adults. African Americans have lower counts than Caucasians or Asians. The average adult level is 1500 cells/mm3 of blood. Neutrophil counts are interpreted as follows:  Greater than 1000 cells/mm3 gives normal protection against infection.  500 to 1000 cells/mm3 gives an increased risk for  infection.  200 to 500 cells/mm3 is a greater risk for severe infection.  Lower than 200 cells/mm3 is a marked risk of infection. This may require hospitalization and treatment with antibiotic medicines. TREATMENT  Treatment depends on the underlying cause, severity, and presence of infections or symptoms. It also depends on your health. Your caregiver will discuss the treatment plan with you. Mild cases are often easily treated and have a good outcome. Preventative measures may also be started to limit your risk of infections. Treatment can include:  Taking antibiotics.  Stopping medicines that are known to cause neutropenia.  Correcting nutritional deficiencies by eating green vegetables to supply folic acid and taking vitamin B supplements.  Stopping exposure to pesticides if your neutropenia is related to pesticide exposure.  Taking a blood growth factor called sargramostim, pegfilgrastim, or filgrastim if you are undergoing chemotherapy for cancer. This stimulates white blood cell production.  Removal of the spleen if you have Felty's syndrome and have repeated infections. HOME CARE INSTRUCTIONS   Follow your caregiver's instructions about when you need to have blood work done.  Wash your hands often. Make sure others who come in contact with you also wash their hands.  Wash raw fruits and vegetables before eating them. They can carry bacteria and fungi.  Avoid people with colds or spreadable (contagious) diseases (chickenpox, herpes zoster, influenza).  Avoid large crowds.  Avoid construction areas. The dust can release fungus into the air.  Be cautious around children in daycare or school environments.  Take care of your respiratory system by coughing and deep breathing.  Bathe daily.  Protect your skin from cuts and   burns.  Do not work in the garden or with flowers and plants.  Care for the mouth before and after meals by brushing with a soft toothbrush. If you have  mucositis, do not use mouthwash. Mouthwash contains alcohol and can dry out the mouth even more.  Clean the area between the genitals and the anus (perineal area) after urination and bowel movements. Women need to wipe from front to back.  Use a water soluble lubricant during sexual intercourse and practice good hygiene after. Do not have intercourse if you are severely neutropenic. Check with your caregiver for guidelines.  Exercise daily as tolerated.  Avoid people who were vaccinated with a live vaccine in the past 30 days. You should not receive live vaccines (polio, typhoid).  Do not provide direct care for pets. Avoid animal droppings. Do not clean litter boxes and bird cages.  Do not share food utensils.  Do not use tampons, enemas, or rectal suppositories unless directed by your caregiver.  Use an electric razor to remove hair.  Wash your hands after handling magazines, letters, and newspapers. SEEK IMMEDIATE MEDICAL CARE IF:   You have a fever.  You have chills or start to shake.  You feel nauseous or vomit.  You develop mouth sores.  You develop aches and pains.  You have redness and swelling around open wounds.  Your skin is warm to the touch.  You have pus coming from your wounds.  You develop swollen lymph nodes.  You feel weak or fatigued.  You develop red streaks on the skin. MAKE SURE YOU:  Understand these instructions.  Will watch your condition.  Will get help right away if you are not doing well or get worse. Document Released: 02/01/2002 Document Revised: 11/04/2011 Document Reviewed: 03/01/2011 ExitCare Patient Information 2015 [employer]. This information is not intended to replace advice given to you by your health care provider. Make sure you discuss any questions you have with your health care provider.  

## 2014-06-22 NOTE — Telephone Encounter (Signed)
Close encounter 

## 2014-06-22 NOTE — Patient Instructions (Signed)

## 2014-06-23 ENCOUNTER — Telehealth: Payer: Self-pay | Admitting: Oncology

## 2014-06-23 ENCOUNTER — Telehealth: Payer: Self-pay | Admitting: *Deleted

## 2014-06-23 ENCOUNTER — Other Ambulatory Visit: Payer: Medicare Other

## 2014-06-23 NOTE — Telephone Encounter (Signed)
lvm forpt regarding to 11.4 time change.Marland KitchenMarland KitchenMarland Kitchen

## 2014-06-23 NOTE — Telephone Encounter (Signed)
Message copied by Brien Few on Thu Jun 23, 2014 10:49 AM ------      Message from: Betsy Coder B      Created: Wed Jun 22, 2014  8:43 PM       Add ferritin to lab 10/28      Start ferrous sulfate 325mg  bid ------

## 2014-06-23 NOTE — Telephone Encounter (Signed)
Per staff message and POF I have scheduled appts. Advised scheduler of appts. JMW  

## 2014-06-23 NOTE — Telephone Encounter (Signed)
Spoke with pt's wife, instructions given to increase ferrous sulfate to twice daily. She voiced understanding.

## 2014-06-24 ENCOUNTER — Telehealth: Payer: Self-pay | Admitting: *Deleted

## 2014-06-24 DIAGNOSIS — C189 Malignant neoplasm of colon, unspecified: Secondary | ICD-10-CM

## 2014-06-24 NOTE — Telephone Encounter (Signed)
Message copied by Wardell Heath on Fri Jun 24, 2014 10:52 AM ------      Message from: Brien Few      Created: Fri Jun 24, 2014  9:52 AM                   ----- Message -----         From: Ladell Pier, MD         Sent: 06/22/2014   8:43 PM           To: Ludwig Lean, RN            Add ferritin to lab 10/28      Start ferrous sulfate 325mg  bid ------

## 2014-06-24 NOTE — Telephone Encounter (Signed)
Called and informed patient to start ferrous sulfate 325mg  bid.  Patient verbalized understanding.

## 2014-06-24 NOTE — Telephone Encounter (Signed)
Message copied by Wardell Heath on Fri Jun 24, 2014 10:59 AM ------      Message from: Brien Few      Created: Fri Jun 24, 2014  9:52 AM                   ----- Message -----         From: Ladell Pier, MD         Sent: 06/22/2014   8:43 PM           To: Ludwig Lean, RN            Add ferritin to lab 10/28      Start ferrous sulfate 325mg  bid ------

## 2014-06-27 ENCOUNTER — Other Ambulatory Visit: Payer: Medicare Other

## 2014-06-29 ENCOUNTER — Other Ambulatory Visit (HOSPITAL_BASED_OUTPATIENT_CLINIC_OR_DEPARTMENT_OTHER): Payer: Medicare Other

## 2014-06-29 ENCOUNTER — Ambulatory Visit: Payer: Medicare Other

## 2014-06-29 ENCOUNTER — Telehealth: Payer: Self-pay | Admitting: Oncology

## 2014-06-29 ENCOUNTER — Ambulatory Visit (HOSPITAL_BASED_OUTPATIENT_CLINIC_OR_DEPARTMENT_OTHER): Payer: Medicare Other

## 2014-06-29 DIAGNOSIS — C189 Malignant neoplasm of colon, unspecified: Secondary | ICD-10-CM

## 2014-06-29 DIAGNOSIS — Z95828 Presence of other vascular implants and grafts: Secondary | ICD-10-CM

## 2014-06-29 DIAGNOSIS — C179 Malignant neoplasm of small intestine, unspecified: Secondary | ICD-10-CM

## 2014-06-29 DIAGNOSIS — C787 Secondary malignant neoplasm of liver and intrahepatic bile duct: Secondary | ICD-10-CM

## 2014-06-29 DIAGNOSIS — Z5111 Encounter for antineoplastic chemotherapy: Secondary | ICD-10-CM

## 2014-06-29 LAB — CBC WITH DIFFERENTIAL/PLATELET
BASO%: 0.6 % (ref 0.0–2.0)
Basophils Absolute: 0.1 10*3/uL (ref 0.0–0.1)
EOS ABS: 0.2 10*3/uL (ref 0.0–0.5)
EOS%: 1.7 % (ref 0.0–7.0)
HEMATOCRIT: 22.3 % — AB (ref 38.4–49.9)
HGB: 7.1 g/dL — ABNORMAL LOW (ref 13.0–17.1)
LYMPH#: 1.2 10*3/uL (ref 0.9–3.3)
LYMPH%: 11.5 % — ABNORMAL LOW (ref 14.0–49.0)
MCH: 24.8 pg — ABNORMAL LOW (ref 27.2–33.4)
MCHC: 31.8 g/dL — AB (ref 32.0–36.0)
MCV: 78 fL — ABNORMAL LOW (ref 79.3–98.0)
MONO#: 1.6 10*3/uL — ABNORMAL HIGH (ref 0.1–0.9)
MONO%: 15.2 % — ABNORMAL HIGH (ref 0.0–14.0)
NEUT%: 71 % (ref 39.0–75.0)
NEUTROS ABS: 7.6 10*3/uL — AB (ref 1.5–6.5)
Platelets: 430 10*3/uL — ABNORMAL HIGH (ref 140–400)
RBC: 2.86 10*6/uL — ABNORMAL LOW (ref 4.20–5.82)
RDW: 18.6 % — ABNORMAL HIGH (ref 11.0–14.6)
WBC: 10.7 10*3/uL — AB (ref 4.0–10.3)

## 2014-06-29 LAB — COMPREHENSIVE METABOLIC PANEL (CC13)
ALT: 21 U/L (ref 0–55)
AST: 29 U/L (ref 5–34)
Albumin: 1.6 g/dL — ABNORMAL LOW (ref 3.5–5.0)
Alkaline Phosphatase: 445 U/L — ABNORMAL HIGH (ref 40–150)
Anion Gap: 6 mEq/L (ref 3–11)
BUN: 16.8 mg/dL (ref 7.0–26.0)
CALCIUM: 8.3 mg/dL — AB (ref 8.4–10.4)
CHLORIDE: 105 meq/L (ref 98–109)
CO2: 19 mEq/L — ABNORMAL LOW (ref 22–29)
Creatinine: 0.9 mg/dL (ref 0.7–1.3)
Glucose: 98 mg/dl (ref 70–140)
Potassium: 4.5 mEq/L (ref 3.5–5.1)
Sodium: 131 mEq/L — ABNORMAL LOW (ref 136–145)
Total Bilirubin: 0.24 mg/dL (ref 0.20–1.20)
Total Protein: 5.5 g/dL — ABNORMAL LOW (ref 6.4–8.3)

## 2014-06-29 LAB — FERRITIN CHCC: FERRITIN: 203 ng/mL (ref 22–316)

## 2014-06-29 LAB — TECHNOLOGIST REVIEW

## 2014-06-29 MED ORDER — FLUOROURACIL CHEMO INJECTION 2.5 GM/50ML
400.0000 mg/m2 | Freq: Once | INTRAVENOUS | Status: AC
Start: 2014-06-29 — End: 2014-06-29
  Administered 2014-06-29: 800 mg via INTRAVENOUS
  Filled 2014-06-29: qty 16

## 2014-06-29 MED ORDER — SODIUM CHLORIDE 0.9 % IV SOLN
2400.0000 mg/m2 | INTRAVENOUS | Status: DC
  Administered 2014-06-29: 4950 mg via INTRAVENOUS
  Filled 2014-06-29: qty 99

## 2014-06-29 MED ORDER — DEXAMETHASONE SODIUM PHOSPHATE 10 MG/ML IJ SOLN
10.0000 mg | Freq: Once | INTRAMUSCULAR | Status: AC
  Administered 2014-06-29: 10 mg via INTRAVENOUS

## 2014-06-29 MED ORDER — DEXAMETHASONE SODIUM PHOSPHATE 10 MG/ML IJ SOLN
INTRAMUSCULAR | Status: AC
  Filled 2014-06-29: qty 1

## 2014-06-29 MED ORDER — OXALIPLATIN CHEMO INJECTION 100 MG/20ML
85.0000 mg/m2 | Freq: Once | INTRAVENOUS | Status: AC
  Administered 2014-06-29: 175 mg via INTRAVENOUS
  Filled 2014-06-29: qty 35

## 2014-06-29 MED ORDER — PALONOSETRON HCL INJECTION 0.25 MG/5ML
INTRAVENOUS | Status: AC
  Filled 2014-06-29: qty 5

## 2014-06-29 MED ORDER — SODIUM CHLORIDE 0.9 % IV SOLN
150.0000 mg | Freq: Once | INTRAVENOUS | Status: AC
  Administered 2014-06-29: 150 mg via INTRAVENOUS
  Filled 2014-06-29: qty 5

## 2014-06-29 MED ORDER — LEUCOVORIN CALCIUM INJECTION 350 MG
400.0000 mg/m2 | Freq: Once | INTRAMUSCULAR | Status: AC
  Administered 2014-06-29: 824 mg via INTRAVENOUS
  Filled 2014-06-29: qty 41.2

## 2014-06-29 MED ORDER — SODIUM CHLORIDE 0.9 % IJ SOLN
10.0000 mL | INTRAMUSCULAR | Status: DC | PRN
  Administered 2014-06-29: 10 mL via INTRAVENOUS
  Filled 2014-06-29: qty 10

## 2014-06-29 MED ORDER — PALONOSETRON HCL INJECTION 0.25 MG/5ML
0.2500 mg | Freq: Once | INTRAVENOUS | Status: AC
  Administered 2014-06-29: 0.25 mg via INTRAVENOUS

## 2014-06-29 MED ORDER — DEXTROSE 5 % IV SOLN
Freq: Once | INTRAVENOUS | Status: AC
  Administered 2014-06-29: 12:00:00 via INTRAVENOUS

## 2014-06-29 MED ORDER — DEXAMETHASONE SODIUM PHOSPHATE 20 MG/5ML IJ SOLN
INTRAMUSCULAR | Status: AC
  Filled 2014-06-29: qty 5

## 2014-06-29 NOTE — Patient Instructions (Signed)

## 2014-06-29 NOTE — Patient Instructions (Signed)
[facility] Discharge Instructions for Patients Receiving Chemotherapy  Today you received the following chemotherapy agents:  Oxaliplatin/leucovorin/5FU/pump  To help prevent nausea and vomiting after your treatment, we encourage you to take your nausea medication.  If you develop nausea and vomiting that is not controlled by your nausea medication, call the clinic.   BELOW ARE SYMPTOMS THAT SHOULD BE REPORTED IMMEDIATELY:  *FEVER GREATER THAN 100.5 F  *CHILLS WITH OR WITHOUT FEVER  NAUSEA AND VOMITING THAT IS NOT CONTROLLED WITH YOUR NAUSEA MEDICATION  *UNUSUAL SHORTNESS OF BREATH  *UNUSUAL BRUISING OR BLEEDING  TENDERNESS IN MOUTH AND THROAT WITH OR WITHOUT PRESENCE OF ULCERS  *URINARY PROBLEMS  *BOWEL PROBLEMS  UNUSUAL RASH Items with * indicate a potential emergency and should be followed up as soon as possible.  Feel free to call the clinic you have any questions or concerns. The clinic phone number is [phone].

## 2014-06-29 NOTE — Telephone Encounter (Signed)
per inf nurse (myrtle) change time of pump/inj appts 11/6 to later in the PM - 2pm or after. lmonvm for pt re new time for appts 11/6 @ 3:30pm.

## 2014-06-30 ENCOUNTER — Encounter: Payer: Self-pay | Admitting: *Deleted

## 2014-06-30 NOTE — Progress Notes (Signed)
Per Dr. Benay Spice : Do not give flu vaccine on day of Neulasta. Wait until next chemo appointment.

## 2014-07-01 ENCOUNTER — Ambulatory Visit: Payer: Medicare Other

## 2014-07-01 ENCOUNTER — Ambulatory Visit (HOSPITAL_BASED_OUTPATIENT_CLINIC_OR_DEPARTMENT_OTHER): Payer: Medicare Other

## 2014-07-01 DIAGNOSIS — Z5189 Encounter for other specified aftercare: Secondary | ICD-10-CM

## 2014-07-01 DIAGNOSIS — Z452 Encounter for adjustment and management of vascular access device: Secondary | ICD-10-CM

## 2014-07-01 DIAGNOSIS — C179 Malignant neoplasm of small intestine, unspecified: Secondary | ICD-10-CM

## 2014-07-01 MED ORDER — HEPARIN SOD (PORK) LOCK FLUSH 100 UNIT/ML IV SOLN
500.0000 [IU] | Freq: Once | INTRAVENOUS | Status: AC | PRN
  Administered 2014-07-01: 500 [IU]
  Filled 2014-07-01: qty 5

## 2014-07-01 MED ORDER — SODIUM CHLORIDE 0.9 % IJ SOLN
10.0000 mL | INTRAMUSCULAR | Status: DC | PRN
  Administered 2014-07-01: 10 mL
  Filled 2014-07-01: qty 10

## 2014-07-01 MED ORDER — PEGFILGRASTIM INJECTION 6 MG/0.6ML
6.0000 mg | Freq: Once | SUBCUTANEOUS | Status: AC
  Administered 2014-07-01: 6 mg via SUBCUTANEOUS
  Filled 2014-07-01: qty 0.6

## 2014-07-01 NOTE — Patient Instructions (Signed)
Pegfilgrastim injection (Neulasta) What is this medicine? PEGFILGRASTIM (peg fil GRA stim) is a long-acting granulocyte colony-stimulating factor that stimulates the growth of neutrophils, a type of white blood cell important in the body's fight against infection. It is used to reduce the incidence of fever and infection in patients with certain types of cancer who are receiving chemotherapy that affects the bone marrow. This medicine may be used for other purposes; ask your health care provider or pharmacist if you have questions. COMMON BRAND NAME(S): Neulasta What should I tell my health care provider before I take this medicine? They need to know if you have any of these conditions: -latex allergy -ongoing radiation therapy -sickle cell disease -skin reactions to acrylic adhesives (On-Body Injector only) -an unusual or allergic reaction to pegfilgrastim, filgrastim, other medicines, foods, dyes, or preservatives -pregnant or trying to get pregnant -breast-feeding How should I use this medicine? This medicine is for injection under the skin. If you get this medicine at home, you will be taught how to prepare and give the pre-filled syringe or how to use the On-body Injector. Refer to the patient Instructions for Use for detailed instructions. Use exactly as directed. Take your medicine at regular intervals. Do not take your medicine more often than directed. It is important that you put your used needles and syringes in a special sharps container. Do not put them in a trash can. If you do not have a sharps container, call your pharmacist or healthcare provider to get one. Talk to your pediatrician regarding the use of this medicine in children. Special care may be needed. Overdosage: If you think you have taken too much of this medicine contact a poison control center or emergency room at once. NOTE: This medicine is only for you. Do not share this medicine with others. What if I miss a  dose? It is important not to miss your dose. Call your doctor or health care professional if you miss your dose. If you miss a dose due to an On-body Injector failure or leakage, a new dose should be administered as soon as possible using a single prefilled syringe for manual use. What may interact with this medicine? Interactions have not been studied. Give your health care provider a list of all the medicines, herbs, non-prescription drugs, or dietary supplements you use. Also tell them if you smoke, drink alcohol, or use illegal drugs. Some items may interact with your medicine. This list may not describe all possible interactions. Give your health care provider a list of all the medicines, herbs, non-prescription drugs, or dietary supplements you use. Also tell them if you smoke, drink alcohol, or use illegal drugs. Some items may interact with your medicine. What should I watch for while using this medicine? You may need blood work done while you are taking this medicine. If you are going to need a MRI, CT scan, or other procedure, tell your doctor that you are using this medicine (On-Body Injector only). What side effects may I notice from receiving this medicine? Side effects that you should report to your doctor or health care professional as soon as possible: -allergic reactions like skin rash, itching or hives, swelling of the face, lips, or tongue -dizziness -fever -pain, redness, or irritation at site where injected -pinpoint red spots on the skin -shortness of breath or breathing problems -stomach or side pain, or pain at the shoulder -swelling -tiredness -trouble passing urine Side effects that usually do not require medical attention (report to your   doctor or health care professional if they continue or are bothersome): -bone pain -muscle pain This list may not describe all possible side effects. Call your doctor for medical advice about side effects. You may report side  effects to FDA at 1-800-FDA-1088. Where should I keep my medicine? Keep out of the reach of children. Store pre-filled syringes in a refrigerator between 2 and 8 degrees C (36 and 46 degrees F). Do not freeze. Keep in carton to protect from light. Throw away this medicine if it is left out of the refrigerator for more than 48 hours. Throw away any unused medicine after the expiration date. NOTE: This sheet is a summary. It may not cover all possible information. If you have questions about this medicine, talk to your doctor, pharmacist, or health care provider.  2015, Elsevier/Gold Standard. (2013-11-11 16:14:05)  

## 2014-07-05 ENCOUNTER — Telehealth: Payer: Self-pay | Admitting: *Deleted

## 2014-07-05 NOTE — Telephone Encounter (Signed)
Received call from pt stating that pt had received several letters from [facility] denying services due to not have prior approval.  Spoke with care management for further instructions. Called pt back and spoke with wife.  Informed wife that per care management: 1.  Wait until pt received billing statements, and pt can call billing dept. For further assistance.   Gave wife phone number to billing dept.  Wife stated she would relay message to pt. Billing dept    [phone].

## 2014-07-06 ENCOUNTER — Ambulatory Visit: Payer: Medicare Other | Admitting: Nurse Practitioner

## 2014-07-06 ENCOUNTER — Inpatient Hospital Stay: Payer: Medicare Other

## 2014-07-06 ENCOUNTER — Other Ambulatory Visit: Payer: Medicare Other

## 2014-07-07 ENCOUNTER — Other Ambulatory Visit: Payer: Self-pay | Admitting: Nurse Practitioner

## 2014-07-10 ENCOUNTER — Other Ambulatory Visit: Payer: Self-pay | Admitting: Oncology

## 2014-07-12 ENCOUNTER — Telehealth: Payer: Self-pay | Admitting: *Deleted

## 2014-07-12 ENCOUNTER — Other Ambulatory Visit: Payer: Self-pay | Admitting: *Deleted

## 2014-07-12 ENCOUNTER — Other Ambulatory Visit (HOSPITAL_BASED_OUTPATIENT_CLINIC_OR_DEPARTMENT_OTHER): Payer: Medicare Other

## 2014-07-12 ENCOUNTER — Ambulatory Visit (HOSPITAL_BASED_OUTPATIENT_CLINIC_OR_DEPARTMENT_OTHER): Payer: Medicare Other | Admitting: Oncology

## 2014-07-12 ENCOUNTER — Other Ambulatory Visit: Payer: Self-pay | Admitting: Nurse Practitioner

## 2014-07-12 ENCOUNTER — Telehealth: Payer: Self-pay | Admitting: Oncology

## 2014-07-12 ENCOUNTER — Ambulatory Visit: Payer: Medicare Other | Admitting: Oncology

## 2014-07-12 ENCOUNTER — Ambulatory Visit (HOSPITAL_BASED_OUTPATIENT_CLINIC_OR_DEPARTMENT_OTHER): Payer: Medicare Other

## 2014-07-12 VITALS — BP 126/67 | HR 98 | Temp 97.8°F

## 2014-07-12 VITALS — BP 130/58 | HR 113 | Temp 98.4°F | Resp 18 | Ht 71.0 in | Wt 177.1 lb

## 2014-07-12 DIAGNOSIS — C61 Malignant neoplasm of prostate: Secondary | ICD-10-CM

## 2014-07-12 DIAGNOSIS — C787 Secondary malignant neoplasm of liver and intrahepatic bile duct: Secondary | ICD-10-CM

## 2014-07-12 DIAGNOSIS — I81 Portal vein thrombosis: Secondary | ICD-10-CM

## 2014-07-12 DIAGNOSIS — D509 Iron deficiency anemia, unspecified: Secondary | ICD-10-CM

## 2014-07-12 DIAGNOSIS — C179 Malignant neoplasm of small intestine, unspecified: Secondary | ICD-10-CM

## 2014-07-12 DIAGNOSIS — D649 Anemia, unspecified: Secondary | ICD-10-CM

## 2014-07-12 DIAGNOSIS — C480 Malignant neoplasm of retroperitoneum: Secondary | ICD-10-CM

## 2014-07-12 DIAGNOSIS — C189 Malignant neoplasm of colon, unspecified: Secondary | ICD-10-CM

## 2014-07-12 DIAGNOSIS — M542 Cervicalgia: Secondary | ICD-10-CM

## 2014-07-12 DIAGNOSIS — N19 Unspecified kidney failure: Secondary | ICD-10-CM

## 2014-07-12 LAB — CBC WITH DIFFERENTIAL/PLATELET
BASO%: 0.6 % (ref 0.0–2.0)
Basophils Absolute: 0.1 10*3/uL (ref 0.0–0.1)
EOS%: 0.3 % (ref 0.0–7.0)
Eosinophils Absolute: 0 10*3/uL (ref 0.0–0.5)
HCT: 20 % — ABNORMAL LOW (ref 38.4–49.9)
HGB: 6.2 g/dL — CL (ref 13.0–17.1)
LYMPH#: 1.2 10*3/uL (ref 0.9–3.3)
LYMPH%: 7.7 % — ABNORMAL LOW (ref 14.0–49.0)
MCH: 24.8 pg — ABNORMAL LOW (ref 27.2–33.4)
MCHC: 31.2 g/dL — ABNORMAL LOW (ref 32.0–36.0)
MCV: 79.3 fL (ref 79.3–98.0)
MONO#: 1.8 10*3/uL — AB (ref 0.1–0.9)
MONO%: 12.2 % (ref 0.0–14.0)
NEUT#: 12 10*3/uL — ABNORMAL HIGH (ref 1.5–6.5)
NEUT%: 79.2 % — ABNORMAL HIGH (ref 39.0–75.0)
Platelets: 298 10*3/uL (ref 140–400)
RBC: 2.52 10*6/uL — AB (ref 4.20–5.82)
RDW: 20.1 % — ABNORMAL HIGH (ref 11.0–14.6)
WBC: 15.1 10*3/uL — AB (ref 4.0–10.3)

## 2014-07-12 LAB — COMPREHENSIVE METABOLIC PANEL (CC13)
ALBUMIN: 1.6 g/dL — AB (ref 3.5–5.0)
ALT: 15 U/L (ref 0–55)
AST: 29 U/L (ref 5–34)
Alkaline Phosphatase: 368 U/L — ABNORMAL HIGH (ref 40–150)
Anion Gap: 7 mEq/L (ref 3–11)
BUN: 12.2 mg/dL (ref 7.0–26.0)
CHLORIDE: 107 meq/L (ref 98–109)
CO2: 21 mEq/L — ABNORMAL LOW (ref 22–29)
Calcium: 8.4 mg/dL (ref 8.4–10.4)
Creatinine: 0.8 mg/dL (ref 0.7–1.3)
Glucose: 130 mg/dl (ref 70–140)
POTASSIUM: 4.4 meq/L (ref 3.5–5.1)
Sodium: 136 mEq/L (ref 136–145)
Total Bilirubin: 0.31 mg/dL (ref 0.20–1.20)
Total Protein: 5.4 g/dL — ABNORMAL LOW (ref 6.4–8.3)

## 2014-07-12 LAB — CEA: CEA: 3.2 ng/mL (ref 0.0–5.0)

## 2014-07-12 MED ORDER — OXYCODONE-ACETAMINOPHEN 5-325 MG PO TABS: 1.0000 | ORAL_TABLET | Freq: Four times a day (QID) | ORAL | Status: DC | PRN

## 2014-07-12 MED ORDER — HEPARIN SOD (PORK) LOCK FLUSH 100 UNIT/ML IV SOLN
500.0000 [IU] | Freq: Once | INTRAVENOUS | Status: AC
  Administered 2014-07-12: 500 [IU] via INTRAVENOUS
  Filled 2014-07-12: qty 5

## 2014-07-12 MED ORDER — SODIUM CHLORIDE 0.9 % IJ SOLN
10.0000 mL | INTRAMUSCULAR | Status: DC | PRN
  Administered 2014-07-12: 10 mL via INTRAVENOUS
  Filled 2014-07-12: qty 10

## 2014-07-12 NOTE — Telephone Encounter (Signed)
Per staff message and POF I have scheduled appts. Advised scheduler of appts. JMW  

## 2014-07-12 NOTE — Telephone Encounter (Signed)
Pt confirmed labs/ov per 11/17 POF, gave pt AVS..... KJ, sent msg to add chemo

## 2014-07-12 NOTE — Progress Notes (Signed)
[facility] OFFICE PROGRESS NOTE   Diagnosis: small bowel carcinoma  INTERVAL HISTORY:   Dean Owens returns as scheduled. He completed a second cycle of FOLFOX on 06/29/2014. No nausea/vomiting, mouth sores, or neuropathy symptoms. Stable frequent bowel movements. He becomes constipated when taking iron. He received Neulasta following chemotherapy. He reports pain in the abdomen and back for approximate 6 days after Neulasta. He does not wish to receive Neulasta again. The abdomen and leg swelling have improved. He reports a good appetite. He is able to ambulate in the home without dyspnea. He has dyspnea when going up stairs.  Objective:  Vital signs in last 24 hours:  Blood pressure 130/58, pulse 113, temperature 98.4 F (36.9 C), temperature source Oral, resp. rate 18, height 5' 11" (1.803 m), weight 177 lb 1.6 oz (80.332 kg), SpO2 100 %.    HEENT: no thrush or ulcers Resp: lungs clear bilaterally Cardio: regular rhythm with premature beats GI: mildly distended, no hepatomegaly, no mass Vascular: trace pitting edema at the lower legs bilaterally  Skin:healing surgical site at the left pretibial region without evidence of infection   Portacath/PICC-without erythema  Lab Results:  Lab Results  Component Value Date   WBC 15.1* 07/12/2014   HGB 6.2* 07/12/2014   HCT 20.0* 07/12/2014   MCV 79.3 07/12/2014   PLT 298 07/12/2014   NEUTROABS 12.0* 07/12/2014   BUN 12, creatinine 0.8, potassium 4.4, albumin 1.6   Lab Results  Component Value Date   CEA 46.5* 05/24/2014    Medications: I have reviewed the patient's current medications.  Assessment/Plan: 1.Adenocarcinoma of the small bowel, stage III (T3 N1), status post a partial small bowel resection 10/14/2013   The tumor returned microsatellite instability high with loss of MSH2 expression   MRI of the liver 05/28/2014 with multiple hepatic metastases and portal/retroperitoneal lymphadenopathy,  ultrasound-guided biopsy of the liver 05/31/2014 confirmed metastatic small bowel carcinoma   Cycle 1 FOLFOX 06/08/2014  Cycle 2 FOLFOX with Neulasta support 06/29/2014 2. Colon cancer X3, 1997 and 1999, status post an intra-abdominal colectomy with ileorectal anastomosis in May 1999  3. Iron deficiency anemia  4. Diabetes  5. Hypothyroidism  6. Hypertension  7. 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 Ascites-likely secondary to portal hypertension versus carcinomatosis, repeat peritoneal fluid cytologies negative for tumor cells  10. 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 Right neck pain-most likely secondary to a benign musculoskeletal condition, CT of the neck 06/06/2014 with degenerative changes, no evidence of metastatic disease  12. Renal failure-likely related to carcinomatosis and hepatic metastases, he saw nephrology 06/07/2014, improved  13. Nausea and vomiting following cycle 1 FOLFOX 14.neutropenia following cycle one FOLFOX, Neulasta and with cycle 2    Disposition:  Mr. [name] has an improved performance status. The ascites has not reaccumulated since undergoing a paracentesis 06/14/2014. The plan is to proceed with cycle 3 FOLFOX on 07/13/2014. He will not receive Neulasta with this cycle. He will return for a nadir CBC 07/22/2014. Mr. [name] has severe anemia. I encouraged him to take the iron twice daily. He will be scheduled   for a red cell transfusion on 07/15/2014. We reviewed the potential risks  associated with the red cell transfusion and he agrees to proceed.  The plan is to complete 4-5 cycles of FOLFOX prior to a restaging evaluation.  , , MD  07/12/2014  1:51 PM    

## 2014-07-12 NOTE — Patient Instructions (Signed)

## 2014-07-13 ENCOUNTER — Other Ambulatory Visit: Payer: Medicare Other

## 2014-07-13 ENCOUNTER — Telehealth: Payer: Self-pay | Admitting: Dietician

## 2014-07-13 ENCOUNTER — Ambulatory Visit (HOSPITAL_BASED_OUTPATIENT_CLINIC_OR_DEPARTMENT_OTHER): Payer: Medicare Other

## 2014-07-13 ENCOUNTER — Ambulatory Visit (HOSPITAL_COMMUNITY)
Admission: RE | Admit: 2014-07-13 | Discharge: 2014-07-13 | Disposition: A | Discharge status: Home / Self Care | Payer: Medicare Other | Source: Ambulatory Visit | Attending: Oncology | Admitting: Oncology

## 2014-07-13 VITALS — BP 110/60 | HR 89 | Temp 97.3°F | Resp 18

## 2014-07-13 DIAGNOSIS — C787 Secondary malignant neoplasm of liver and intrahepatic bile duct: Secondary | ICD-10-CM

## 2014-07-13 DIAGNOSIS — C179 Malignant neoplasm of small intestine, unspecified: Secondary | ICD-10-CM

## 2014-07-13 DIAGNOSIS — Z23 Encounter for immunization: Secondary | ICD-10-CM

## 2014-07-13 DIAGNOSIS — C480 Malignant neoplasm of retroperitoneum: Secondary | ICD-10-CM

## 2014-07-13 DIAGNOSIS — Z5111 Encounter for antineoplastic chemotherapy: Secondary | ICD-10-CM

## 2014-07-13 DIAGNOSIS — D649 Anemia, unspecified: Secondary | ICD-10-CM | POA: Diagnosis not present

## 2014-07-13 LAB — PSA: PSA: 0.12 ng/mL (ref <=4.00)

## 2014-07-13 LAB — PREPARE RBC (CROSSMATCH)

## 2014-07-13 MED ORDER — INFLUENZA VAC SPLIT QUAD 0.5 ML IM SUSY
0.5000 mL | PREFILLED_SYRINGE | Freq: Once | INTRAMUSCULAR | Status: AC
  Administered 2014-07-13: 0.5 mL via INTRAMUSCULAR
  Filled 2014-07-13: qty 0.5

## 2014-07-13 MED ORDER — FLUOROURACIL CHEMO INJECTION 2.5 GM/50ML
400.0000 mg/m2 | Freq: Once | INTRAVENOUS | Status: AC
  Administered 2014-07-13: 800 mg via INTRAVENOUS
  Filled 2014-07-13: qty 16

## 2014-07-13 MED ORDER — DEXTROSE 5 % IV SOLN
Freq: Once | INTRAVENOUS | Status: AC
  Administered 2014-07-13: 11:00:00 via INTRAVENOUS

## 2014-07-13 MED ORDER — PALONOSETRON HCL INJECTION 0.25 MG/5ML
0.2500 mg | Freq: Once | INTRAVENOUS | Status: AC
  Administered 2014-07-13: 0.25 mg via INTRAVENOUS

## 2014-07-13 MED ORDER — FLUOROURACIL CHEMO INJECTION 5 GM/100ML
2400.0000 mg/m2 | INTRAVENOUS | Status: DC
  Administered 2014-07-13: 4950 mg via INTRAVENOUS
  Filled 2014-07-13: qty 99

## 2014-07-13 MED ORDER — DEXAMETHASONE SODIUM PHOSPHATE 10 MG/ML IJ SOLN
10.0000 mg | Freq: Once | INTRAMUSCULAR | Status: AC
  Administered 2014-07-13: 10 mg via INTRAVENOUS

## 2014-07-13 MED ORDER — DEXTROSE 5 % IV SOLN
65.0000 mg/m2 | Freq: Once | INTRAVENOUS | Status: AC
  Administered 2014-07-13: 135 mg via INTRAVENOUS
  Filled 2014-07-13: qty 27

## 2014-07-13 MED ORDER — SODIUM CHLORIDE 0.9 % IJ SOLN
10.0000 mL | INTRAMUSCULAR | Status: DC | PRN
  Administered 2014-07-13: 10 mL
  Filled 2014-07-13: qty 10

## 2014-07-13 MED ORDER — LEUCOVORIN CALCIUM INJECTION 350 MG
400.0000 mg/m2 | Freq: Once | INTRAVENOUS | Status: AC
  Administered 2014-07-13: 824 mg via INTRAVENOUS
  Filled 2014-07-13: qty 41.2

## 2014-07-13 MED ORDER — SODIUM CHLORIDE 0.9 % IV SOLN
150.0000 mg | Freq: Once | INTRAVENOUS | Status: AC
  Administered 2014-07-13: 150 mg via INTRAVENOUS
  Filled 2014-07-13: qty 5

## 2014-07-13 MED ORDER — PALONOSETRON HCL INJECTION 0.25 MG/5ML
INTRAVENOUS | Status: AC
  Filled 2014-07-13: qty 5

## 2014-07-13 MED ORDER — DEXAMETHASONE SODIUM PHOSPHATE 10 MG/ML IJ SOLN
INTRAMUSCULAR | Status: AC
  Filled 2014-07-13: qty 1

## 2014-07-13 NOTE — Telephone Encounter (Signed)
Brief Outpatient Oncology Nutrition Note  Patient has been identified to be at risk on malnutrition screen.  Wt Readings from Last 10 Encounters:  07/12/14 177 lb 1.6 oz (80.332 kg)  06/22/14 172 lb 12.8 oz (78.382 kg)  06/08/14 187 lb 4.8 oz (84.959 kg)  06/02/14 187 lb 9.6 oz (85.095 kg)  05/27/14 190 lb 8 oz (86.41 kg)  05/24/14 196 lb 11.2 oz (89.223 kg)  05/19/14 203 lb 6.4 oz (92.262 kg)  05/16/14 202 lb 4.8 oz (91.763 kg)  04/06/14 203 lb 3.2 oz (92.171 kg)  03/07/14 198 lb 6.4 oz (89.994 kg)    Dx:  Small bowel Cancer undergoing chemo.  To receive blood transfusion 11/20 for severe anemia.  Called patient due to weight loss.  Patient states that he is now doing well, eating well and gaining weight.  Drinks [employer] daily.  Will mail handout on iron and diet along with coupons for the [employer] and contact information for the [facility].  Antonieta Iba, RD, LDN

## 2014-07-13 NOTE — Progress Notes (Signed)
Updated calendar provided to pt.  Pt refused full AVS.

## 2014-07-15 ENCOUNTER — Ambulatory Visit (HOSPITAL_BASED_OUTPATIENT_CLINIC_OR_DEPARTMENT_OTHER): Payer: Medicare Other

## 2014-07-15 ENCOUNTER — Ambulatory Visit: Payer: Medicare Other

## 2014-07-15 VITALS — BP 124/76 | HR 63 | Temp 98.0°F | Resp 20

## 2014-07-15 DIAGNOSIS — C179 Malignant neoplasm of small intestine, unspecified: Secondary | ICD-10-CM

## 2014-07-15 DIAGNOSIS — D649 Anemia, unspecified: Secondary | ICD-10-CM | POA: Diagnosis not present

## 2014-07-15 MED ORDER — HEPARIN SOD (PORK) LOCK FLUSH 100 UNIT/ML IV SOLN
500.0000 [IU] | Freq: Every day | INTRAVENOUS | Status: AC | PRN
  Administered 2014-07-15: 500 [IU]
  Filled 2014-07-15: qty 5

## 2014-07-15 MED ORDER — SODIUM CHLORIDE 0.9 % IJ SOLN
10.0000 mL | INTRAMUSCULAR | Status: AC | PRN
  Administered 2014-07-15: 10 mL
  Filled 2014-07-15: qty 10

## 2014-07-15 MED ORDER — SODIUM CHLORIDE 0.9 % IV SOLN
250.0000 mL | Freq: Once | INTRAVENOUS | Status: AC
  Administered 2014-07-15: 250 mL via INTRAVENOUS

## 2014-07-15 NOTE — Patient Instructions (Signed)

## 2014-07-15 NOTE — Progress Notes (Signed)
Pt to get blood today so arrived to infusion and chemo pump will be D/C'd there today.

## 2014-07-16 LAB — TYPE AND SCREEN
ABO/RH(D): O POS
Antibody Screen: NEGATIVE
Unit division: 0
Unit division: 0

## 2014-07-20 ENCOUNTER — Ambulatory Visit: Payer: Medicare Other | Admitting: Oncology

## 2014-07-20 ENCOUNTER — Other Ambulatory Visit: Payer: Medicare Other

## 2014-07-20 ENCOUNTER — Inpatient Hospital Stay: Payer: Medicare Other

## 2014-07-22 ENCOUNTER — Other Ambulatory Visit: Payer: Self-pay | Admitting: Oncology

## 2014-07-26 ENCOUNTER — Telehealth: Payer: Self-pay | Admitting: *Deleted

## 2014-07-26 NOTE — Telephone Encounter (Signed)
Pt left voicemail for Symptom Management NP Jenny Reichmann reporting he has received several insurance denials for failure to obtain prior authorization. Instructed pt to bring letters to office and meet with financial advocate to review the forms. He voiced understanding.

## 2014-07-27 ENCOUNTER — Telehealth: Payer: Self-pay | Admitting: Oncology

## 2014-07-27 ENCOUNTER — Ambulatory Visit: Payer: Medicare Other

## 2014-07-27 ENCOUNTER — Ambulatory Visit (HOSPITAL_BASED_OUTPATIENT_CLINIC_OR_DEPARTMENT_OTHER): Payer: Medicare Other

## 2014-07-27 ENCOUNTER — Telehealth: Payer: Self-pay | Admitting: *Deleted

## 2014-07-27 ENCOUNTER — Ambulatory Visit: Payer: Medicare Other | Admitting: Nurse Practitioner

## 2014-07-27 ENCOUNTER — Other Ambulatory Visit (HOSPITAL_BASED_OUTPATIENT_CLINIC_OR_DEPARTMENT_OTHER): Payer: Medicare Other | Admitting: Nurse Practitioner

## 2014-07-27 ENCOUNTER — Ambulatory Visit (HOSPITAL_COMMUNITY): Payer: Medicare Other | Attending: Oncology

## 2014-07-27 ENCOUNTER — Ambulatory Visit (HOSPITAL_BASED_OUTPATIENT_CLINIC_OR_DEPARTMENT_OTHER): Payer: Medicare Other | Admitting: Nurse Practitioner

## 2014-07-27 VITALS — BP 132/69 | HR 92 | Temp 97.9°F | Resp 20 | Ht 71.0 in | Wt 178.2 lb

## 2014-07-27 DIAGNOSIS — C179 Malignant neoplasm of small intestine, unspecified: Secondary | ICD-10-CM

## 2014-07-27 DIAGNOSIS — R188 Other ascites: Secondary | ICD-10-CM | POA: Insufficient documentation

## 2014-07-27 DIAGNOSIS — Z800 Family history of malignant neoplasm of digestive organs: Secondary | ICD-10-CM | POA: Insufficient documentation

## 2014-07-27 DIAGNOSIS — D701 Agranulocytosis secondary to cancer chemotherapy: Secondary | ICD-10-CM

## 2014-07-27 DIAGNOSIS — E039 Hypothyroidism, unspecified: Secondary | ICD-10-CM | POA: Insufficient documentation

## 2014-07-27 DIAGNOSIS — E119 Type 2 diabetes mellitus without complications: Secondary | ICD-10-CM | POA: Insufficient documentation

## 2014-07-27 DIAGNOSIS — N19 Unspecified kidney failure: Secondary | ICD-10-CM | POA: Diagnosis not present

## 2014-07-27 DIAGNOSIS — D709 Neutropenia, unspecified: Secondary | ICD-10-CM | POA: Diagnosis not present

## 2014-07-27 DIAGNOSIS — I81 Portal vein thrombosis: Secondary | ICD-10-CM | POA: Insufficient documentation

## 2014-07-27 DIAGNOSIS — D509 Iron deficiency anemia, unspecified: Secondary | ICD-10-CM

## 2014-07-27 DIAGNOSIS — I10 Essential (primary) hypertension: Secondary | ICD-10-CM | POA: Insufficient documentation

## 2014-07-27 DIAGNOSIS — Z95828 Presence of other vascular implants and grafts: Secondary | ICD-10-CM

## 2014-07-27 DIAGNOSIS — C189 Malignant neoplasm of colon, unspecified: Secondary | ICD-10-CM

## 2014-07-27 DIAGNOSIS — C787 Secondary malignant neoplasm of liver and intrahepatic bile duct: Secondary | ICD-10-CM

## 2014-07-27 LAB — CBC WITH DIFFERENTIAL/PLATELET
BASO%: 0.9 % (ref 0.0–2.0)
Basophils Absolute: 0 10*3/uL (ref 0.0–0.1)
EOS ABS: 0 10*3/uL (ref 0.0–0.5)
EOS%: 0.8 % (ref 0.0–7.0)
HCT: 25 % — ABNORMAL LOW (ref 38.4–49.9)
HGB: 7.5 g/dL — ABNORMAL LOW (ref 13.0–17.1)
LYMPH#: 0.6 10*3/uL — AB (ref 0.9–3.3)
LYMPH%: 22.5 % (ref 14.0–49.0)
MCH: 24.6 pg — ABNORMAL LOW (ref 27.2–33.4)
MCHC: 30.2 g/dL — AB (ref 32.0–36.0)
MCV: 81.4 fL (ref 79.3–98.0)
MONO#: 1.2 10*3/uL — AB (ref 0.1–0.9)
MONO%: 42.4 % — ABNORMAL HIGH (ref 0.0–14.0)
NEUT%: 33.4 % — ABNORMAL LOW (ref 39.0–75.0)
NEUTROS ABS: 1 10*3/uL — AB (ref 1.5–6.5)
PLATELETS: 297 10*3/uL (ref 140–400)
RBC: 3.07 10*6/uL — ABNORMAL LOW (ref 4.20–5.82)
RDW: 20.6 % — ABNORMAL HIGH (ref 11.0–14.6)
WBC: 2.9 10*3/uL — AB (ref 4.0–10.3)

## 2014-07-27 LAB — COMPREHENSIVE METABOLIC PANEL (CC13)
ALBUMIN: 1.7 g/dL — AB (ref 3.5–5.0)
ALT: 11 U/L (ref 0–55)
ANION GAP: 8 meq/L (ref 3–11)
AST: 17 U/L (ref 5–34)
Alkaline Phosphatase: 360 U/L — ABNORMAL HIGH (ref 40–150)
BUN: 9.8 mg/dL (ref 7.0–26.0)
CHLORIDE: 109 meq/L (ref 98–109)
CO2: 20 meq/L — AB (ref 22–29)
Calcium: 8.3 mg/dL — ABNORMAL LOW (ref 8.4–10.4)
Creatinine: 0.7 mg/dL (ref 0.7–1.3)
GLUCOSE: 87 mg/dL (ref 70–140)
Potassium: 3.9 mEq/L (ref 3.5–5.1)
Sodium: 136 mEq/L (ref 136–145)
Total Bilirubin: 0.44 mg/dL (ref 0.20–1.20)
Total Protein: 5.6 g/dL — ABNORMAL LOW (ref 6.4–8.3)

## 2014-07-27 LAB — PREPARE RBC (CROSSMATCH)

## 2014-07-27 MED ORDER — SODIUM CHLORIDE 0.9 % IV SOLN: 2400.0000 mg/m2 | INTRAVENOUS | Status: DC

## 2014-07-27 MED ORDER — DEXTROSE 5 % IV SOLN: Freq: Once | INTRAVENOUS | Status: DC

## 2014-07-27 MED ORDER — OXYCODONE-ACETAMINOPHEN 5-325 MG PO TABS: 1.0000 | ORAL_TABLET | Freq: Four times a day (QID) | ORAL | Status: DC | PRN

## 2014-07-27 MED ORDER — DEXAMETHASONE SODIUM PHOSPHATE 10 MG/ML IJ SOLN: 10.0000 mg | Freq: Once | INTRAMUSCULAR | Status: DC

## 2014-07-27 MED ORDER — HEPARIN SOD (PORK) LOCK FLUSH 100 UNIT/ML IV SOLN
500.0000 [IU] | Freq: Every day | INTRAVENOUS | Status: AC | PRN
  Administered 2014-07-27: 500 [IU]
  Filled 2014-07-27: qty 5

## 2014-07-27 MED ORDER — SODIUM CHLORIDE 0.9 % IJ SOLN
10.0000 mL | INTRAMUSCULAR | Status: DC | PRN
  Administered 2014-07-27: 10 mL via INTRAVENOUS
  Filled 2014-07-27: qty 10

## 2014-07-27 MED ORDER — SODIUM CHLORIDE 0.9 % IV SOLN
INTRAVENOUS | Status: DC
  Administered 2014-07-27 (×2): via INTRAVENOUS

## 2014-07-27 MED ORDER — SODIUM CHLORIDE 0.9 % IV SOLN: 150.0000 mg | Freq: Once | INTRAVENOUS | Status: DC

## 2014-07-27 MED ORDER — SODIUM CHLORIDE 0.9 % IJ SOLN
10.0000 mL | INTRAMUSCULAR | Status: DC | PRN
  Filled 2014-07-27: qty 10

## 2014-07-27 MED ORDER — PALONOSETRON HCL INJECTION 0.25 MG/5ML: 0.2500 mg | Freq: Once | INTRAVENOUS | Status: DC

## 2014-07-27 MED ORDER — OXALIPLATIN CHEMO INJECTION 100 MG/20ML: 65.0000 mg/m2 | Freq: Once | INTRAVENOUS | Status: DC

## 2014-07-27 MED ORDER — DEXTROSE 5 % IV SOLN: 400.0000 mg/m2 | Freq: Once | INTRAVENOUS | Status: DC

## 2014-07-27 MED ORDER — FLUOROURACIL CHEMO INJECTION 2.5 GM/50ML: 400.0000 mg/m2 | Freq: Once | INTRAVENOUS | Status: DC

## 2014-07-27 MED ORDER — SODIUM CHLORIDE 0.9 % IV SOLN: 250.0000 mL | Freq: Once | INTRAVENOUS | Status: DC

## 2014-07-27 NOTE — Patient Instructions (Signed)

## 2014-07-27 NOTE — Telephone Encounter (Signed)
Per staff message and POF I have scheduled appts. Advised scheduler of appts. JMW  

## 2014-07-27 NOTE — Progress Notes (Signed)
[facility] OFFICE PROGRESS NOTE   Diagnosis:  Small bowel carcinoma  INTERVAL HISTORY:   Dean Owens returns as scheduled. He completed cycle 3 FOLFOX on 07/13/2014. No significant nausea/vomiting following the chemotherapy. He had a single mouth sore which has resolved. No change in baseline bowel habits. He typically has 1-4 loose stools a day. Cold sensitivity lasted 2 days. No persistent neuropathy symptoms. No hand or foot pain or redness. He continues to "soreness" in the abdomen. He takes oxycodone as needed. Shortness of breath is better. No cough or fever.  Objective:  Vital signs in last 24 hours:  Blood pressure 132/69, pulse 92, temperature 97.9 F (36.6 C), temperature source Oral, resp. rate 20, height 5' 11" (1.803 m), weight 178 lb 3.2 oz (80.831 kg), SpO2 100 %.    HEENT: no thrush or ulcers. Resp: lungs clear bilaterally. Cardio: regular rate and rhythm. GI: abdomen is mildly distended. No hepatomegaly. No mass. Vascular: trace to 1+ pitting edema at the lower legs bilaterally. Neuro: vibratory sense moderately decreased over the fingertips per tuning fork exam.  Port-A-Cath without erythema.    Lab Results:  Lab Results  Component Value Date   WBC 2.9* 07/27/2014   HGB 7.5* 07/27/2014   HCT 25.0* 07/27/2014   MCV 81.4 07/27/2014   PLT 297 07/27/2014   NEUTROABS 1.0* 07/27/2014    Imaging:  No results found.  Medications: I have reviewed the patient's current medications.  Assessment/Plan: 1.Adenocarcinoma of the small bowel, stage III (T3 N1), status post a partial small bowel resection 10/14/2013   The tumor returned microsatellite instability high with loss of MSH2 expression   MRI of the liver 05/28/2014 with multiple hepatic metastases and portal/retroperitoneal lymphadenopathy, ultrasound-guided biopsy of the liver 05/31/2014 confirmed metastatic small bowel carcinoma   Cycle 1 FOLFOX 06/08/2014  Cycle 2 FOLFOX with  Neulasta support 06/29/2014  Cycle 3 FOLFOX 07/13/2014 2. Colon cancer X3, 1997 and 1999, status post an intra-abdominal colectomy with ileorectal anastomosis in May 1999  3. Iron deficiency anemia  4. Diabetes  5. Hypothyroidism  6. Hypertension  7. 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 Ascites-likely secondary to portal hypertension versus carcinomatosis, repeat peritoneal fluid cytologies negative for tumor cells  10. 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 Right neck pain-most likely secondary to a benign musculoskeletal condition, CT of the neck 06/06/2014 with degenerative changes, no evidence of metastatic disease  12. Renal failure-likely related to carcinomatosis and hepatic metastases, he saw nephrology 06/07/2014, improved  13. Nausea and vomiting following cycle 1 FOLFOX 14. Neutropenia following cycle one FOLFOX, Neulasta added with cycle 2. He declined further Neulasta.   Disposition: Dean Owens appears stable. He has completed 3 cycles of FOLFOX. He is neutropenic on labs today. We will hold cycle 4 today and reschedule to 08/03/2014. Neutropenic precautions were reviewed. He understands to contact the office with fever, chills, other signs of infection.  He continues to be anemic. He will receive 2 units of blood today.  He will return for a followup visit and cycle 5 chemotherapy on 08/22/2014. The plan is  for restaging CT scans after completing 5 cycles of FOLFOX.  Plan reviewed with Dean Owens.  25 minutes were spent face-to-face at today's visit with the majority of the time involved in counseling/coordination of care.    Dean Owens ANP/GNP-BC   07/27/2014  9:52 AM

## 2014-07-27 NOTE — Patient Instructions (Signed)

## 2014-07-27 NOTE — Telephone Encounter (Signed)
gv adn printed appt sched and avs for pt for Dec....sed added tx. °

## 2014-07-28 LAB — TYPE AND SCREEN
ABO/RH(D): O POS
ANTIBODY SCREEN: NEGATIVE
UNIT DIVISION: 0
Unit division: 0

## 2014-07-31 ENCOUNTER — Other Ambulatory Visit: Payer: Self-pay | Admitting: Oncology

## 2014-08-03 ENCOUNTER — Ambulatory Visit (HOSPITAL_BASED_OUTPATIENT_CLINIC_OR_DEPARTMENT_OTHER): Payer: Medicare Other

## 2014-08-03 ENCOUNTER — Other Ambulatory Visit (HOSPITAL_BASED_OUTPATIENT_CLINIC_OR_DEPARTMENT_OTHER): Payer: Medicare Other

## 2014-08-03 DIAGNOSIS — Z5111 Encounter for antineoplastic chemotherapy: Secondary | ICD-10-CM

## 2014-08-03 DIAGNOSIS — C179 Malignant neoplasm of small intestine, unspecified: Secondary | ICD-10-CM

## 2014-08-03 DIAGNOSIS — Z452 Encounter for adjustment and management of vascular access device: Secondary | ICD-10-CM

## 2014-08-03 DIAGNOSIS — Z95828 Presence of other vascular implants and grafts: Secondary | ICD-10-CM

## 2014-08-03 LAB — COMPREHENSIVE METABOLIC PANEL (CC13)
ALK PHOS: 429 U/L — AB (ref 40–150)
ALT: 14 U/L (ref 0–55)
AST: 25 U/L (ref 5–34)
Albumin: 1.7 g/dL — ABNORMAL LOW (ref 3.5–5.0)
Anion Gap: 8 mEq/L (ref 3–11)
BILIRUBIN TOTAL: 0.46 mg/dL (ref 0.20–1.20)
BUN: 8.1 mg/dL (ref 7.0–26.0)
CO2: 23 meq/L (ref 22–29)
CREATININE: 0.7 mg/dL (ref 0.7–1.3)
Calcium: 8.2 mg/dL — ABNORMAL LOW (ref 8.4–10.4)
Chloride: 104 mEq/L (ref 98–109)
EGFR: >90 mL/min/{1.73_m2} (ref >90)
Glucose: 92 mg/dl (ref 70–140)
Potassium: 4 mEq/L (ref 3.5–5.1)
SODIUM: 135 meq/L — AB (ref 136–145)
Total Protein: 6 g/dL — ABNORMAL LOW (ref 6.4–8.3)

## 2014-08-03 LAB — CBC WITH DIFFERENTIAL/PLATELET
BASO%: 0.2 % (ref 0.0–2.0)
BASOS ABS: 0 10*3/uL (ref 0.0–0.1)
EOS ABS: 0 10*3/uL (ref 0.0–0.5)
EOS%: 0.1 % (ref 0.0–7.0)
HEMATOCRIT: 31.5 % — AB (ref 38.4–49.9)
HGB: 10.1 g/dL — ABNORMAL LOW (ref 13.0–17.1)
LYMPH%: 4.7 % — AB (ref 14.0–49.0)
MCH: 26 pg — ABNORMAL LOW (ref 27.2–33.4)
MCHC: 32.1 g/dL (ref 32.0–36.0)
MCV: 81.2 fL (ref 79.3–98.0)
MONO#: 2.3 10*3/uL — ABNORMAL HIGH (ref 0.1–0.9)
MONO%: 12.8 % (ref 0.0–14.0)
NEUT%: 82.2 % — AB (ref 39.0–75.0)
NEUTROS ABS: 15.1 10*3/uL — AB (ref 1.5–6.5)
PLATELETS: 415 10*3/uL — AB (ref 140–400)
RBC: 3.88 10*6/uL — AB (ref 4.20–5.82)
RDW: 19.3 % — ABNORMAL HIGH (ref 11.0–14.6)
WBC: 18.4 10*3/uL — ABNORMAL HIGH (ref 4.0–10.3)
lymph#: 0.9 10*3/uL (ref 0.9–3.3)

## 2014-08-03 LAB — TECHNOLOGIST REVIEW

## 2014-08-03 MED ORDER — PALONOSETRON HCL INJECTION 0.25 MG/5ML
INTRAVENOUS | Status: AC
Start: 2014-08-03 — End: 2014-08-03
  Filled 2014-08-03: qty 5

## 2014-08-03 MED ORDER — DEXTROSE 5 % IV SOLN
Freq: Once | INTRAVENOUS | Status: AC
  Administered 2014-08-03: 09:00:00 via INTRAVENOUS

## 2014-08-03 MED ORDER — DEXAMETHASONE SODIUM PHOSPHATE 10 MG/ML IJ SOLN
INTRAMUSCULAR | Status: AC
  Filled 2014-08-03: qty 1

## 2014-08-03 MED ORDER — PALONOSETRON HCL INJECTION 0.25 MG/5ML
0.2500 mg | Freq: Once | INTRAVENOUS | Status: AC
  Administered 2014-08-03: 0.25 mg via INTRAVENOUS

## 2014-08-03 MED ORDER — SODIUM CHLORIDE 0.9 % IV SOLN
150.0000 mg | Freq: Once | INTRAVENOUS | Status: AC
  Administered 2014-08-03: 150 mg via INTRAVENOUS
  Filled 2014-08-03: qty 5

## 2014-08-03 MED ORDER — LEUCOVORIN CALCIUM INJECTION 350 MG
400.0000 mg/m2 | Freq: Once | INTRAVENOUS | Status: AC
  Administered 2014-08-03: 824 mg via INTRAVENOUS
  Filled 2014-08-03: qty 41.2

## 2014-08-03 MED ORDER — OXALIPLATIN CHEMO INJECTION 100 MG/20ML
65.0000 mg/m2 | Freq: Once | INTRAVENOUS | Status: AC
  Administered 2014-08-03: 135 mg via INTRAVENOUS
  Filled 2014-08-03: qty 27

## 2014-08-03 MED ORDER — SODIUM CHLORIDE 0.9 % IJ SOLN
10.0000 mL | INTRAMUSCULAR | Status: DC | PRN
  Administered 2014-08-03: 10 mL via INTRAVENOUS
  Filled 2014-08-03: qty 10

## 2014-08-03 MED ORDER — SODIUM CHLORIDE 0.9 % IV SOLN
2400.0000 mg/m2 | INTRAVENOUS | Status: DC
  Administered 2014-08-03: 4950 mg via INTRAVENOUS
  Filled 2014-08-03: qty 99

## 2014-08-03 MED ORDER — DEXAMETHASONE SODIUM PHOSPHATE 10 MG/ML IJ SOLN
10.0000 mg | Freq: Once | INTRAMUSCULAR | Status: AC
  Administered 2014-08-03: 10 mg via INTRAVENOUS

## 2014-08-03 MED ORDER — FLUOROURACIL CHEMO INJECTION 2.5 GM/50ML
400.0000 mg/m2 | Freq: Once | INTRAVENOUS | Status: AC
  Administered 2014-08-03: 800 mg via INTRAVENOUS
  Filled 2014-08-03: qty 16

## 2014-08-03 NOTE — Patient Instructions (Signed)
[facility] Discharge Instructions for Patients Receiving Chemotherapy  Today you received the following chemotherapy agents: Oxaliplatin, Leucovorin, 5FU  To help prevent nausea and vomiting after your treatment, we encourage you to take your nausea medication: Compazine 10 mg every 6 hours and Zofran 8 mg every 8 hours begininng 72 hours after chemo as needed.   If you develop nausea and vomiting that is not controlled by your nausea medication, call the clinic.   BELOW ARE SYMPTOMS THAT SHOULD BE REPORTED IMMEDIATELY:  *FEVER GREATER THAN 100.5 F  *CHILLS WITH OR WITHOUT FEVER  NAUSEA AND VOMITING THAT IS NOT CONTROLLED WITH YOUR NAUSEA MEDICATION  *UNUSUAL SHORTNESS OF BREATH  *UNUSUAL BRUISING OR BLEEDING  TENDERNESS IN MOUTH AND THROAT WITH OR WITHOUT PRESENCE OF ULCERS  *URINARY PROBLEMS  *BOWEL PROBLEMS  UNUSUAL RASH Items with * indicate a potential emergency and should be followed up as soon as possible.  Feel free to call the clinic you have any questions or concerns. The clinic phone number is [phone].    

## 2014-08-03 NOTE — Patient Instructions (Signed)

## 2014-08-05 ENCOUNTER — Ambulatory Visit (HOSPITAL_BASED_OUTPATIENT_CLINIC_OR_DEPARTMENT_OTHER): Payer: Medicare Other

## 2014-08-05 DIAGNOSIS — C179 Malignant neoplasm of small intestine, unspecified: Secondary | ICD-10-CM

## 2014-08-05 MED ORDER — SODIUM CHLORIDE 0.9 % IJ SOLN
10.0000 mL | INTRAMUSCULAR | Status: DC | PRN
  Administered 2014-08-05: 10 mL
  Filled 2014-08-05: qty 10

## 2014-08-05 MED ORDER — HEPARIN SOD (PORK) LOCK FLUSH 100 UNIT/ML IV SOLN
500.0000 [IU] | Freq: Once | INTRAVENOUS | Status: AC | PRN
  Administered 2014-08-05: 500 [IU]
  Filled 2014-08-05: qty 5

## 2014-08-10 ENCOUNTER — Other Ambulatory Visit: Payer: Medicare Other

## 2014-08-10 ENCOUNTER — Ambulatory Visit: Payer: Medicare Other | Admitting: Oncology

## 2014-08-10 ENCOUNTER — Inpatient Hospital Stay: Payer: Medicare Other

## 2014-08-12 ENCOUNTER — Other Ambulatory Visit: Payer: Self-pay | Admitting: Nurse Practitioner

## 2014-08-12 ENCOUNTER — Telehealth: Payer: Self-pay | Admitting: *Deleted

## 2014-08-12 DIAGNOSIS — C179 Malignant neoplasm of small intestine, unspecified: Secondary | ICD-10-CM

## 2014-08-12 MED ORDER — OXYCODONE-ACETAMINOPHEN 5-325 MG PO TABS: 1.0000 | ORAL_TABLET | Freq: Four times a day (QID) | ORAL | Status: DC | PRN

## 2014-08-12 NOTE — Telephone Encounter (Signed)
Informed patient's wife Caren Griffins) that prescription is ready for pick up.  Per Elby Showers. Marcello Moores, NP.  Patient verbalized understanding.

## 2014-08-21 ENCOUNTER — Other Ambulatory Visit: Payer: Self-pay | Admitting: Oncology

## 2014-08-22 ENCOUNTER — Telehealth: Payer: Self-pay

## 2014-08-22 ENCOUNTER — Ambulatory Visit (HOSPITAL_BASED_OUTPATIENT_CLINIC_OR_DEPARTMENT_OTHER): Payer: Medicare Other | Admitting: Physician Assistant

## 2014-08-22 ENCOUNTER — Ambulatory Visit (HOSPITAL_BASED_OUTPATIENT_CLINIC_OR_DEPARTMENT_OTHER): Payer: Medicare Other

## 2014-08-22 ENCOUNTER — Ambulatory Visit: Payer: Medicare Other

## 2014-08-22 ENCOUNTER — Other Ambulatory Visit (HOSPITAL_BASED_OUTPATIENT_CLINIC_OR_DEPARTMENT_OTHER): Payer: Medicare Other

## 2014-08-22 ENCOUNTER — Telehealth: Payer: Self-pay | Admitting: *Deleted

## 2014-08-22 ENCOUNTER — Telehealth: Payer: Self-pay | Admitting: Physician Assistant

## 2014-08-22 ENCOUNTER — Encounter: Payer: Self-pay | Admitting: Physician Assistant

## 2014-08-22 VITALS — BP 126/61 | HR 80 | Temp 98.1°F | Resp 18 | Ht 71.0 in | Wt 176.3 lb

## 2014-08-22 DIAGNOSIS — Z95828 Presence of other vascular implants and grafts: Secondary | ICD-10-CM

## 2014-08-22 DIAGNOSIS — C179 Malignant neoplasm of small intestine, unspecified: Secondary | ICD-10-CM

## 2014-08-22 DIAGNOSIS — N289 Disorder of kidney and ureter, unspecified: Secondary | ICD-10-CM

## 2014-08-22 DIAGNOSIS — C787 Secondary malignant neoplasm of liver and intrahepatic bile duct: Secondary | ICD-10-CM

## 2014-08-22 DIAGNOSIS — Z5111 Encounter for antineoplastic chemotherapy: Secondary | ICD-10-CM

## 2014-08-22 DIAGNOSIS — R188 Other ascites: Secondary | ICD-10-CM

## 2014-08-22 DIAGNOSIS — Z1509 Genetic susceptibility to other malignant neoplasm: Secondary | ICD-10-CM

## 2014-08-22 DIAGNOSIS — C189 Malignant neoplasm of colon, unspecified: Secondary | ICD-10-CM

## 2014-08-22 LAB — CBC WITH DIFFERENTIAL/PLATELET
BASO%: 0.5 % (ref 0.0–2.0)
Basophils Absolute: 0.1 10*3/uL (ref 0.0–0.1)
EOS%: 0.6 % (ref 0.0–7.0)
Eosinophils Absolute: 0.1 10*3/uL (ref 0.0–0.5)
HEMATOCRIT: 25.8 % — AB (ref 38.4–49.9)
HGB: 8 g/dL — ABNORMAL LOW (ref 13.0–17.1)
LYMPH%: 6.5 % — AB (ref 14.0–49.0)
MCH: 24.9 pg — ABNORMAL LOW (ref 27.2–33.4)
MCHC: 31.1 g/dL — AB (ref 32.0–36.0)
MCV: 80 fL (ref 79.3–98.0)
MONO#: 1.9 10*3/uL — ABNORMAL HIGH (ref 0.1–0.9)
MONO%: 14.8 % — ABNORMAL HIGH (ref 0.0–14.0)
NEUT#: 9.9 10*3/uL — ABNORMAL HIGH (ref 1.5–6.5)
NEUT%: 77.6 % — ABNORMAL HIGH (ref 39.0–75.0)
PLATELETS: 541 10*3/uL — AB (ref 140–400)
RBC: 3.22 10*6/uL — AB (ref 4.20–5.82)
RDW: 19.2 % — ABNORMAL HIGH (ref 11.0–14.6)
WBC: 12.7 10*3/uL — ABNORMAL HIGH (ref 4.0–10.3)
lymph#: 0.8 10*3/uL — ABNORMAL LOW (ref 0.9–3.3)

## 2014-08-22 LAB — COMPREHENSIVE METABOLIC PANEL (CC13)
ALBUMIN: 1.6 g/dL — AB (ref 3.5–5.0)
ALT: 16 U/L (ref 0–55)
AST: 29 U/L (ref 5–34)
Alkaline Phosphatase: 499 U/L — ABNORMAL HIGH (ref 40–150)
Anion Gap: 7 mEq/L (ref 3–11)
BUN: 7.1 mg/dL (ref 7.0–26.0)
CO2: 23 mEq/L (ref 22–29)
Calcium: 7.7 mg/dL — ABNORMAL LOW (ref 8.4–10.4)
Chloride: 104 mEq/L (ref 98–109)
Creatinine: 0.6 mg/dL — ABNORMAL LOW (ref 0.7–1.3)
EGFR: >90 mL/min/{1.73_m2} (ref >90)
Glucose: 109 mg/dl (ref 70–140)
POTASSIUM: 3.6 meq/L (ref 3.5–5.1)
Sodium: 134 mEq/L — ABNORMAL LOW (ref 136–145)
Total Bilirubin: 0.41 mg/dL (ref 0.20–1.20)
Total Protein: 5.5 g/dL — ABNORMAL LOW (ref 6.4–8.3)

## 2014-08-22 MED ORDER — SODIUM CHLORIDE 0.9 % IV SOLN
2400.0000 mg/m2 | INTRAVENOUS | Status: DC
  Administered 2014-08-22: 4950 mg via INTRAVENOUS
  Filled 2014-08-22: qty 99

## 2014-08-22 MED ORDER — FLUOROURACIL CHEMO INJECTION 2.5 GM/50ML
400.0000 mg/m2 | Freq: Once | INTRAVENOUS | Status: AC
  Administered 2014-08-22: 800 mg via INTRAVENOUS
  Filled 2014-08-22: qty 16

## 2014-08-22 MED ORDER — DEXTROSE 5 % IV SOLN
Freq: Once | INTRAVENOUS | Status: AC
  Administered 2014-08-22: 11:00:00 via INTRAVENOUS

## 2014-08-22 MED ORDER — DEXAMETHASONE SODIUM PHOSPHATE 20 MG/5ML IJ SOLN
INTRAMUSCULAR | Status: AC
  Filled 2014-08-22: qty 5

## 2014-08-22 MED ORDER — SODIUM CHLORIDE 0.9 % IV SOLN
150.0000 mg | Freq: Once | INTRAVENOUS | Status: AC
  Administered 2014-08-22: 150 mg via INTRAVENOUS
  Filled 2014-08-22: qty 5

## 2014-08-22 MED ORDER — METHYLPREDNISOLONE (PAK) 4 MG PO TABS: ORAL_TABLET | ORAL | Status: DC

## 2014-08-22 MED ORDER — DEXAMETHASONE SODIUM PHOSPHATE 10 MG/ML IJ SOLN
INTRAMUSCULAR | Status: AC
  Filled 2014-08-22: qty 1

## 2014-08-22 MED ORDER — OXALIPLATIN CHEMO INJECTION 100 MG/20ML
65.0000 mg/m2 | Freq: Once | INTRAVENOUS | Status: AC
  Administered 2014-08-22: 135 mg via INTRAVENOUS
  Filled 2014-08-22: qty 27

## 2014-08-22 MED ORDER — SODIUM CHLORIDE 0.9 % IJ SOLN
10.0000 mL | INTRAMUSCULAR | Status: DC | PRN
  Administered 2014-08-22: 10 mL via INTRAVENOUS
  Filled 2014-08-22: qty 10

## 2014-08-22 MED ORDER — LEUCOVORIN CALCIUM INJECTION 350 MG
400.0000 mg/m2 | Freq: Once | INTRAMUSCULAR | Status: AC
Start: 2014-08-22 — End: 2014-08-22
  Administered 2014-08-22: 824 mg via INTRAVENOUS
  Filled 2014-08-22: qty 41.2

## 2014-08-22 MED ORDER — DEXAMETHASONE SODIUM PHOSPHATE 10 MG/ML IJ SOLN
10.0000 mg | Freq: Once | INTRAMUSCULAR | Status: AC
  Administered 2014-08-22: 10 mg via INTRAVENOUS

## 2014-08-22 MED ORDER — PALONOSETRON HCL INJECTION 0.25 MG/5ML
INTRAVENOUS | Status: AC
  Filled 2014-08-22: qty 5

## 2014-08-22 MED ORDER — FUROSEMIDE 20 MG PO TABS: 20.0000 mg | ORAL_TABLET | Freq: Every morning | ORAL | Status: DC

## 2014-08-22 MED ORDER — PALONOSETRON HCL INJECTION 0.25 MG/5ML
0.2500 mg | Freq: Once | INTRAVENOUS | Status: AC
  Administered 2014-08-22: 0.25 mg via INTRAVENOUS

## 2014-08-22 NOTE — Patient Instructions (Signed)
Continue labs and chemotherapy as scheduled Follow-up in 2 weeks with a restaging CT scan of your abdomen pelvis to reevaluate your disease

## 2014-08-22 NOTE — Telephone Encounter (Signed)
Left Message for pt to call back regarding CT scan and contrast. Radiology called stating pt needed clarification regarding contrast. Pt is to get CT with No IV contrast d/t kidney problems, but patient is to get contrast the day he gets his pump d/c'd to drink before his CT. Pt needs to call radiology back to schedule CT scan.

## 2014-08-22 NOTE — Patient Instructions (Signed)

## 2014-08-22 NOTE — Telephone Encounter (Signed)
Per staff message and POF I have scheduled appts. Advised scheduler of appts. JMW  

## 2014-08-22 NOTE — Patient Instructions (Signed)
[facility] Discharge Instructions for Patients Receiving Chemotherapy  Today you received the following chemotherapy agents: Oxaliplatin, Leucovorin, 5FU  To help prevent nausea and vomiting after your treatment, we encourage you to take your nausea medication: Compazine 10 mg every 6 hours and Zofran 8 mg every 8 hours begininng 72 hours after chemo as needed.   If you develop nausea and vomiting that is not controlled by your nausea medication, call the clinic.   BELOW ARE SYMPTOMS THAT SHOULD BE REPORTED IMMEDIATELY:  *FEVER GREATER THAN 100.5 F  *CHILLS WITH OR WITHOUT FEVER  NAUSEA AND VOMITING THAT IS NOT CONTROLLED WITH YOUR NAUSEA MEDICATION  *UNUSUAL SHORTNESS OF BREATH  *UNUSUAL BRUISING OR BLEEDING  TENDERNESS IN MOUTH AND THROAT WITH OR WITHOUT PRESENCE OF ULCERS  *URINARY PROBLEMS  *BOWEL PROBLEMS  UNUSUAL RASH Items with * indicate a potential emergency and should be followed up as soon as possible.  Feel free to call the clinic you have any questions or concerns. The clinic phone number is [phone].

## 2014-08-22 NOTE — Telephone Encounter (Signed)
Pt confirmed labs/ov per 12/28 POF, gave pt AVS... KJ, pt was scheduled by AB and msg sent to add Chemo

## 2014-08-22 NOTE — Telephone Encounter (Signed)
Pt called back, informed he will be drinking the contrast and will receive it on 08/24/14 when coming in for his pump d/c , and not be getting any IV contrast. Pt verbalized understanding and number was provided to pt to call CT back to schedule his appt.

## 2014-08-22 NOTE — Progress Notes (Signed)
[facility] OFFICE PROGRESS NOTE   Diagnosis:  Small bowel carcinoma  INTERVAL HISTORY:   Dean Owens returns as scheduled. He completed cycle 4 FOLFOX on 08/03/2014. No significant nausea/vomiting following the chemotherapy. He reports poor by mouth intake. He states he is not hungry/has no appetite and food doesn't taste right. No change in baseline bowel habits. He typically has 1-4 loose stools a day. Cold sensitivity lasted 2 days. No persistent neuropathy symptoms. No hand or foot pain or redness. He continues to "soreness" in the abdomen particularly the right side extending toward his back.[name] He takes oxycodone as needed. Shortness of breath is better. No cough or fever.  Objective:  Vital signs in last 24 hours:  Blood pressure 126/61, pulse 80, temperature 98.1 F (36.7 C), temperature source Oral, resp. rate 18, height 5' 11"  (1.803 m), weight 176 lb 4.8 oz (79.969 kg).    HEENT: no thrush or ulcers. Resp: lungs clear bilaterally. Cardio: regular rate and rhythm. GI: abdomen is mildly distended. No hepatomegaly. No mass. Vascular: trace to 1+ pitting edema at the lower legs bilaterally. Left greater than right. Neuro: vibratory sense moderately decreased over the fingertips per tuning fork exam.  Port-A-Cath without erythema.    Lab Results:  Lab Results  Component Value Date   WBC 12.7* 08/22/2014   HGB 8.0* 08/22/2014   HCT 25.8* 08/22/2014   MCV 80.0 08/22/2014   PLT 541* 08/22/2014   NEUTROABS 9.9* 08/22/2014    Imaging:  No results found.  Medications: I have reviewed the patient's current medications.  Assessment/Plan: 1.Adenocarcinoma of the small bowel, stage III (T3 N1), status post a partial small bowel resection 10/14/2013   The tumor returned microsatellite instability high with loss of MSH2 expression   MRI of the liver 05/28/2014 with multiple hepatic metastases and portal/retroperitoneal lymphadenopathy, ultrasound-guided  biopsy of the liver 05/31/2014 confirmed metastatic small bowel carcinoma   Cycle 1 FOLFOX 06/08/2014  Cycle 2 FOLFOX with Neulasta support 06/29/2014  Cycle 3 FOLFOX 07/13/2014 2. Colon cancer X3, 1997 and 1999, status post an intra-abdominal colectomy with ileorectal anastomosis in May 1999  3. Iron deficiency anemia  4. Diabetes  5. Hypothyroidism  6. Hypertension  7. 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 Ascites-likely secondary to portal hypertension versus carcinomatosis, repeat peritoneal fluid cytologies negative for tumor cells  10. 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 Right neck pain-most likely secondary to a benign musculoskeletal condition, CT of the neck 06/06/2014 with degenerative changes, no evidence of metastatic disease  12. Renal failure-likely related to carcinomatosis and hepatic metastases, he saw nephrology 06/07/2014, improved  13. Nausea and vomiting following cycle 1 FOLFOX 14. Neutropenia following cycle one FOLFOX, Neulasta added with cycle 2. He declined further Neulasta.   Disposition: [name] appears stable. He has completed 4 cycles of FOLFOX. His hemoglobin is slightly low today at 8.0 however he is asymptomatic. We will continue to monitor this and will arrange for packed red blood cell transfusion as needed. He'll proceed with cycle #5 today as scheduled. He tells me that he is awaiting word  from [place] regarding a medication that had been in a clinical trial but is now  available for use in his condition. For his appetite we'll start with a Medrol Dosepak. We did discuss the use of Marinol if needed in this can be explored at a later office visit if he still has significant decreased by mouth intake and weight loss.  A refill prescription for his Lasix was sent to his pharmacy of record via E scribe as well as prescription for a Medrol Dosepak.  He will return for a followup visit and cycle 6 chemotherapy on 09/06/2014. I have ordered a restaging CT scan of the abdomen and pelvis with contrast to reevaluate his disease. He will see Dr. Benay Spice on on 09/06/2014 to discuss the results of the restaging CT scan and further treatment options.  Plan reviewed with Dr. Benay Spice. 25 minutes were spent face-to-face at today's visit with the majority of the time involved in counseling/coordination of care.    Awilda Metro E PA-C  08/22/2014  1:25 PM

## 2014-08-24 ENCOUNTER — Ambulatory Visit (HOSPITAL_BASED_OUTPATIENT_CLINIC_OR_DEPARTMENT_OTHER): Payer: Medicare Other

## 2014-08-24 DIAGNOSIS — C787 Secondary malignant neoplasm of liver and intrahepatic bile duct: Secondary | ICD-10-CM

## 2014-08-24 DIAGNOSIS — C179 Malignant neoplasm of small intestine, unspecified: Secondary | ICD-10-CM

## 2014-08-24 MED ORDER — SODIUM CHLORIDE 0.9 % IJ SOLN
10.0000 mL | INTRAMUSCULAR | Status: DC | PRN
  Administered 2014-08-24: 10 mL
  Filled 2014-08-24: qty 10

## 2014-08-24 MED ORDER — HEPARIN SOD (PORK) LOCK FLUSH 100 UNIT/ML IV SOLN
500.0000 [IU] | Freq: Once | INTRAVENOUS | Status: AC | PRN
  Administered 2014-08-24: 500 [IU]
  Filled 2014-08-24: qty 5

## 2014-08-29 ENCOUNTER — Other Ambulatory Visit: Payer: Self-pay | Admitting: Medical Oncology

## 2014-08-29 DIAGNOSIS — C179 Malignant neoplasm of small intestine, unspecified: Secondary | ICD-10-CM

## 2014-08-29 MED ORDER — OXYCODONE-ACETAMINOPHEN 5-325 MG PO TABS
1.0000 | ORAL_TABLET | Freq: Four times a day (QID) | ORAL | Status: DC | PRN
Start: 2014-08-29 — End: 2014-09-06

## 2014-08-29 NOTE — Progress Notes (Signed)
Wife requests pain med refill . Pt is out of his medication. Refill locked in injection room and wife notified to pick it up.

## 2014-09-02 ENCOUNTER — Ambulatory Visit (HOSPITAL_COMMUNITY)
Admission: RE | Admit: 2014-09-02 | Discharge: 2014-09-02 | Disposition: A | Discharge status: Home / Self Care | Payer: Medicare Other | Source: Ambulatory Visit | Attending: Physician Assistant | Admitting: Physician Assistant

## 2014-09-02 DIAGNOSIS — C787 Secondary malignant neoplasm of liver and intrahepatic bile duct: Secondary | ICD-10-CM | POA: Insufficient documentation

## 2014-09-02 DIAGNOSIS — R109 Unspecified abdominal pain: Secondary | ICD-10-CM | POA: Insufficient documentation

## 2014-09-02 DIAGNOSIS — R188 Other ascites: Secondary | ICD-10-CM | POA: Insufficient documentation

## 2014-09-02 DIAGNOSIS — C786 Secondary malignant neoplasm of retroperitoneum and peritoneum: Secondary | ICD-10-CM | POA: Diagnosis not present

## 2014-09-02 DIAGNOSIS — R599 Enlarged lymph nodes, unspecified: Secondary | ICD-10-CM | POA: Diagnosis not present

## 2014-09-02 DIAGNOSIS — C189 Malignant neoplasm of colon, unspecified: Secondary | ICD-10-CM

## 2014-09-04 ENCOUNTER — Other Ambulatory Visit: Payer: Self-pay | Admitting: Oncology

## 2014-09-05 ENCOUNTER — Encounter: Payer: Self-pay | Admitting: Radiation Oncology

## 2014-09-05 NOTE — Progress Notes (Signed)
Forwarded short term disability forms to [place], Therapist, sports for processing

## 2014-09-06 ENCOUNTER — Ambulatory Visit (HOSPITAL_COMMUNITY)
Admission: RE | Admit: 2014-09-06 | Discharge: 2014-09-06 | Disposition: A | Discharge status: Home / Self Care | Payer: Medicare Other | Source: Ambulatory Visit | Attending: Oncology | Admitting: Oncology

## 2014-09-06 ENCOUNTER — Other Ambulatory Visit (HOSPITAL_BASED_OUTPATIENT_CLINIC_OR_DEPARTMENT_OTHER): Payer: Medicare Other

## 2014-09-06 ENCOUNTER — Other Ambulatory Visit: Payer: Self-pay | Admitting: Oncology

## 2014-09-06 ENCOUNTER — Telehealth: Payer: Self-pay | Admitting: Oncology

## 2014-09-06 ENCOUNTER — Ambulatory Visit: Payer: Medicare Other

## 2014-09-06 ENCOUNTER — Other Ambulatory Visit: Payer: Self-pay | Admitting: *Deleted

## 2014-09-06 ENCOUNTER — Ambulatory Visit (HOSPITAL_BASED_OUTPATIENT_CLINIC_OR_DEPARTMENT_OTHER): Payer: Medicare Other | Admitting: Oncology

## 2014-09-06 ENCOUNTER — Ambulatory Visit (HOSPITAL_BASED_OUTPATIENT_CLINIC_OR_DEPARTMENT_OTHER): Payer: Medicare Other

## 2014-09-06 VITALS — BP 134/61 | HR 81 | Temp 98.3°F | Resp 18

## 2014-09-06 VITALS — BP 103/64 | HR 91 | Temp 98.4°F | Resp 18 | Ht 71.0 in | Wt 156.9 lb

## 2014-09-06 DIAGNOSIS — T451X5A Adverse effect of antineoplastic and immunosuppressive drugs, initial encounter: Secondary | ICD-10-CM | POA: Insufficient documentation

## 2014-09-06 DIAGNOSIS — C189 Malignant neoplasm of colon, unspecified: Secondary | ICD-10-CM

## 2014-09-06 DIAGNOSIS — D649 Anemia, unspecified: Secondary | ICD-10-CM

## 2014-09-06 DIAGNOSIS — E119 Type 2 diabetes mellitus without complications: Secondary | ICD-10-CM | POA: Diagnosis not present

## 2014-09-06 DIAGNOSIS — D630 Anemia in neoplastic disease: Secondary | ICD-10-CM | POA: Insufficient documentation

## 2014-09-06 DIAGNOSIS — C229 Malignant neoplasm of liver, not specified as primary or secondary: Secondary | ICD-10-CM

## 2014-09-06 DIAGNOSIS — E039 Hypothyroidism, unspecified: Secondary | ICD-10-CM | POA: Insufficient documentation

## 2014-09-06 DIAGNOSIS — C179 Malignant neoplasm of small intestine, unspecified: Secondary | ICD-10-CM | POA: Diagnosis present

## 2014-09-06 DIAGNOSIS — Z800 Family history of malignant neoplasm of digestive organs: Secondary | ICD-10-CM | POA: Insufficient documentation

## 2014-09-06 DIAGNOSIS — E876 Hypokalemia: Secondary | ICD-10-CM

## 2014-09-06 DIAGNOSIS — D638 Anemia in other chronic diseases classified elsewhere: Secondary | ICD-10-CM

## 2014-09-06 DIAGNOSIS — D509 Iron deficiency anemia, unspecified: Secondary | ICD-10-CM | POA: Insufficient documentation

## 2014-09-06 DIAGNOSIS — R110 Nausea: Secondary | ICD-10-CM

## 2014-09-06 DIAGNOSIS — I10 Essential (primary) hypertension: Secondary | ICD-10-CM | POA: Insufficient documentation

## 2014-09-06 DIAGNOSIS — D6481 Anemia due to antineoplastic chemotherapy: Secondary | ICD-10-CM | POA: Insufficient documentation

## 2014-09-06 DIAGNOSIS — Z95828 Presence of other vascular implants and grafts: Secondary | ICD-10-CM

## 2014-09-06 LAB — CBC WITH DIFFERENTIAL/PLATELET
BASO%: 0.5 % (ref 0.0–2.0)
Basophils Absolute: 0 10*3/uL (ref 0.0–0.1)
EOS ABS: 0 10*3/uL (ref 0.0–0.5)
EOS%: 0.4 % (ref 0.0–7.0)
HCT: 23.2 % — ABNORMAL LOW (ref 38.4–49.9)
HGB: 7.2 g/dL — ABNORMAL LOW (ref 13.0–17.1)
LYMPH#: 0.6 10*3/uL — AB (ref 0.9–3.3)
LYMPH%: 11.4 % — AB (ref 14.0–49.0)
MCH: 24.8 pg — ABNORMAL LOW (ref 27.2–33.4)
MCHC: 31 g/dL — ABNORMAL LOW (ref 32.0–36.0)
MCV: 80 fL (ref 79.3–98.0)
MONO#: 1.5 10*3/uL — AB (ref 0.1–0.9)
MONO%: 26.3 % — ABNORMAL HIGH (ref 0.0–14.0)
NEUT%: 61.4 % (ref 39.0–75.0)
NEUTROS ABS: 3.4 10*3/uL (ref 1.5–6.5)
NRBC: 1 % — AB (ref 0–0)
Platelets: 288 10*3/uL (ref 140–400)
RBC: 2.9 10*6/uL — AB (ref 4.20–5.82)
RDW: 19.9 % — AB (ref 11.0–14.6)
WBC: 5.5 10*3/uL (ref 4.0–10.3)

## 2014-09-06 LAB — COMPREHENSIVE METABOLIC PANEL (CC13)
ALBUMIN: 1.8 g/dL — AB (ref 3.5–5.0)
ALK PHOS: 377 U/L — AB (ref 40–150)
ALT: 9 U/L (ref 0–55)
ANION GAP: 7 meq/L (ref 3–11)
AST: 23 U/L (ref 5–34)
BUN: 9.8 mg/dL (ref 7.0–26.0)
CALCIUM: 7.6 mg/dL — AB (ref 8.4–10.4)
CHLORIDE: 102 meq/L (ref 98–109)
CO2: 26 mEq/L (ref 22–29)
Creatinine: 0.8 mg/dL (ref 0.7–1.3)
EGFR: >90 mL/min/{1.73_m2} (ref >90)
GLUCOSE: 88 mg/dL (ref 70–140)
POTASSIUM: 2.9 meq/L — AB (ref 3.5–5.1)
Sodium: 135 mEq/L — ABNORMAL LOW (ref 136–145)
Total Bilirubin: 0.55 mg/dL (ref 0.20–1.20)
Total Protein: 5.8 g/dL — ABNORMAL LOW (ref 6.4–8.3)

## 2014-09-06 LAB — PREPARE RBC (CROSSMATCH)

## 2014-09-06 MED ORDER — SODIUM CHLORIDE 0.9 % IJ SOLN
10.0000 mL | INTRAMUSCULAR | Status: DC | PRN
  Administered 2014-09-06: 10 mL via INTRAVENOUS
  Filled 2014-09-06: qty 10

## 2014-09-06 MED ORDER — ONDANSETRON HCL 8 MG PO TABS: 8.0000 mg | ORAL_TABLET | Freq: Three times a day (TID) | ORAL | Status: AC | PRN

## 2014-09-06 MED ORDER — HEPARIN SOD (PORK) LOCK FLUSH 100 UNIT/ML IV SOLN
500.0000 [IU] | Freq: Every day | INTRAVENOUS | Status: AC | PRN
  Administered 2014-09-06: 500 [IU]
  Filled 2014-09-06: qty 5

## 2014-09-06 MED ORDER — OXYCODONE-ACETAMINOPHEN 5-325 MG PO TABS
ORAL_TABLET | ORAL | Status: AC
  Filled 2014-09-06: qty 1

## 2014-09-06 MED ORDER — OXYCODONE-ACETAMINOPHEN 5-325 MG PO TABS
1.0000 | ORAL_TABLET | Freq: Once | ORAL | Status: AC
  Administered 2014-09-06: 1 via ORAL

## 2014-09-06 MED ORDER — POTASSIUM CHLORIDE CRYS ER 20 MEQ PO TBCR: 20.0000 meq | EXTENDED_RELEASE_TABLET | Freq: Every day | ORAL | Status: AC

## 2014-09-06 MED ORDER — SODIUM CHLORIDE 0.9 % IV SOLN
250.0000 mL | Freq: Once | INTRAVENOUS | Status: AC
  Administered 2014-09-06: 250 mL via INTRAVENOUS

## 2014-09-06 MED ORDER — OXYCODONE-ACETAMINOPHEN 5-325 MG PO TABS: 1.0000 | ORAL_TABLET | Freq: Four times a day (QID) | ORAL | Status: DC | PRN

## 2014-09-06 MED ORDER — SODIUM CHLORIDE 0.9 % IJ SOLN
10.0000 mL | INTRAMUSCULAR | Status: AC | PRN
  Administered 2014-09-06: 10 mL
  Filled 2014-09-06: qty 10

## 2014-09-06 NOTE — Telephone Encounter (Signed)
Pt confirmed labs/ov per 01/12 POF, gave pt AVS.... KJ, pt's wife states to cancel his pump D/C on 01/14, but no msg to cancel so I left until further notice.... KJ

## 2014-09-06 NOTE — Progress Notes (Signed)
Pt. C/o lower back and bilateral flank pain. Rates pain at 6-7. Pt. Normally takes percocet 5/325 at home but does not have any with him.  Received order from Dr. Benay Spice for 1 percocet 5/325.  Pt. Very appreciative.

## 2014-09-06 NOTE — Telephone Encounter (Signed)
Gave pt new script for Percocet in treatment area and explained taking potassium 40 meq today and then 20 meq daily; Zofran 8 mg as needed for nausea.  Pt verbalized understanding of all information.

## 2014-09-06 NOTE — Patient Instructions (Signed)

## 2014-09-06 NOTE — Patient Instructions (Signed)

## 2014-09-06 NOTE — Progress Notes (Signed)
[facility] OFFICE PROGRESS NOTE   Diagnosis: Small bowel carcinoma  INTERVAL HISTORY:   Dean Owens returns as scheduled. He completed cycle 5 FOLFOX 08/22/2014. He reports consistent nausea following chemotherapy. He has anorexia. He developed diarrhea after taking the CT contrast 09/02/2014. The diarrhea lasted 2 days and has resolved. The nausea improved with Zofran.  Dean Owens continues to have abdominal pain and pain at the right lower back. The pain is relieved with oxycodone.  Mild numbness in the fingers, none in the feet.  Objective:  Vital signs in last 24 hours:  Blood pressure 103/64, pulse 91, temperature 98.4 F (36.9 C), resp. rate 18, height 5' 11"  (1.803 m), weight 156 lb 14.4 oz (71.169 kg), SpO2 100 %.    HEENT: Mild buccal and subungual thrush, no ulcers Resp: Lungs clear bilaterally Cardio: Regular rate and rhythm GI: No hepatomegaly, nontender, no apparent ascites Vascular: Trace edema at the low leg bilaterally  Musculoskeletal: Mild tenderness in the right flank area, no mass   Portacath/PICC-without erythema  Lab Results:  Lab Results  Component Value Date   WBC 5.5 09/06/2014   HGB 7.2* 09/06/2014   HCT 23.2* 09/06/2014   MCV 80.0 09/06/2014   PLT 288 09/06/2014   NEUTROABS 3.4 09/06/2014   Potassium 2.9, currently 0.8  Lab Results  Component Value Date   CEA 3.2 07/12/2014    Imaging:  Ct Abdomen Pelvis Wo Contrast  09/02/2014   CLINICAL DATA:  Restaging colon cancer, liver metastases. Ongoing chemotherapy. Right-sided abdominal pain for 6 months. Fatigue with decreased appetite.  EXAM: CT ABDOMEN AND PELVIS WITHOUT CONTRAST  TECHNIQUE: Multidetector CT imaging of the abdomen and pelvis was performed following the standard protocol without IV contrast.  COMPARISON:  MR abdomen 05/28/2014 and CT abdomen pelvis 05/26/2014.  FINDINGS: Lower chest: Lung bases show no acute findings. Heart size normal. Coronary artery  calcification. No pericardial or pleural effusion.  Hepatobiliary: Marked biliary ductal dilatation, as before. Heterogeneous right hepatic lobe mass measures approximately 8.7 x 12.2 cm. Exact margins are difficult to visualize without IV contrast. Gallbladder is unremarkable.  Pancreas: Calcifications are seen in the pancreas which is otherwise unremarkable.  Spleen: Negative.  Adrenals/Urinary Tract: Adrenal glands and right kidney are unremarkable. An exophytic low-attenuation lesion off the lower pole left kidney measures 1.3 cm, better seen on 05/28/2014. Ureters are decompressed. Bladder wall appears somewhat thickened but the bladder is under distended.  Stomach/Bowel: Stomach is unremarkable. Postoperative changes are seen in the small bowel. Patient is status post total colectomy.  Vascular/Lymphatic: Atherosclerotic calcification of the arterial vasculature without abdominal aortic aneurysm. Abdominal peritoneal ligament, retrocrural and retroperitoneal adenopathy is again seen. Index porta hepatis lymph node measures 3.2 x 4.9 cm (previously 3.0 x 3.4 cm). Left periaortic index lymph node measures 2.5 x 2.9 cm (previously 1.7 x 2.0 cm).  Reproductive: Brachytherapy seeds are seen in the prostate.  Other: Ascites has increased. There is stranding and nodularity in the omentum and small bowel mesentery. A mass in the central small bowel mesentery is ill-defined, measuring approximately 1.9 x 3.4 cm (series 2, image 47), likely stable. A focal fluid density collection anterior to the inferior right psoas muscle is seen in the region of surgical clips, measuring 2.7 x 4.6 cm (image 63), also stable and possibly a postoperative seroma. New peritoneal nodularity extends into the anterior anatomic pelvis (example image 74).  Musculoskeletal: No worrisome lytic or sclerotic lesions.  IMPRESSION: 1. Interval progression of metastatic disease as  evidenced by enlarging abdominal peritoneal ligament, retroperitoneal  and retrocrural adenopathy, as well as progressive ascites and peritoneal carcinomatosis. 2. Hepatic metastatic disease is difficult to definitively measure without IV contrast.   Electronically Signed   By: Lorin Picket M.D.   On: 09/02/2014 15:36    Medications: I have reviewed the patient's current medications.  Assessment/Plan: 1.Adenocarcinoma of the small bowel, stage III (T3 N1), status post a partial small bowel resection 10/14/2013   The tumor returned microsatellite instability high with loss of MSH2 expression   MRI of the liver 05/28/2014 with multiple hepatic metastases and portal/retroperitoneal lymphadenopathy, ultrasound-guided biopsy of the liver 05/31/2014 confirmed metastatic small bowel carcinoma   Cycle 1 FOLFOX 06/08/2014  Cycle 2 FOLFOX with Neulasta support 06/29/2014  Cycle 3 FOLFOX 07/13/2014  Cycle 4 FOLFOX 08/03/2014  Cycle 5 FOLFOX 08/22/2014  Restaging CT 09/02/2014 revealed progression of abdominal lymphadenopathy and peritoneal carcinomatosis 2. Colon cancer X3, 1997 and 1999, status post an intra-abdominal colectomy with ileorectal anastomosis in May 1999  3. Iron deficiency anemia  4. Diabetes  5. Hypothyroidism  6. Hypertension  7. 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 Ascites-likely secondary to portal hypertension versus carcinomatosis, repeat peritoneal fluid cytologies negative for tumor cells  10. 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 Right neck pain-most likely  secondary to a benign musculoskeletal condition, CT of the neck 06/06/2014 with degenerative changes, no evidence of metastatic disease  12. Renal failure-likely related to carcinomatosis and hepatic metastases, he saw nephrology 06/07/2014, improved  13. Nausea and vomiting following cycle 1 FOLFOX 14. Neutropenia following cycle one FOLFOX, Neulasta added with cycle 2. He declined further Neulasta. 15. 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 Hypokalemia-likely secondary to diarrhea 17. Pain-likely secondary to metastatic lymphadenopathy and carcinomatosis, relieved with oxycodone   Disposition:  Dean Owens has completed 5 cycles of FOLFOX. The restaging CT reveals evidence of progressive disease. I was hopeful he had responded to FOLFOX based on the decrease in ascites and improved CEA. I suspect the improvement in ascites is related to collateral vessel flow around the portal vein obstruction.  I reviewed the CT findings with Dean Owens and his wife. We decided to discontinue FOLFOX. He will be transfused with packed red blood cells today. I will discuss Pembrolizumab therapy with Dr. Reynaldo Minium. He has been approved for this agent be of the pharmaceutical company.  Dean Owens will return for a lab visit in one week and an office visit in 2 weeks.  He will try Zofran for nausea. He will begin a potassium supplement.  Approximate 40 minutes were spent with the patient today. The majority of the time was used for counseling and coordination of care.      Betsy Coder, MD  09/06/2014  12:13 PM

## 2014-09-07 LAB — TYPE AND SCREEN
ABO/RH(D): O POS
ANTIBODY SCREEN: NEGATIVE
UNIT DIVISION: 0
Unit division: 0

## 2014-09-07 LAB — CEA: CEA: 3.8 ng/mL (ref 0.0–5.0)

## 2014-09-08 ENCOUNTER — Telehealth: Payer: Self-pay | Admitting: *Deleted

## 2014-09-08 NOTE — Telephone Encounter (Signed)
Left VM requesting return call to discuss status of prior approval from [place] to have his pembrolizumab administered here. Requested faxed documentation of the approval. Will drug be shipped here from [employer] or is it a drug replacement situation. Is he on a clinical trial? If so, we need trial information to ensure we follow guidelines. Has he had this medication yet?

## 2014-09-08 NOTE — Telephone Encounter (Signed)
Per staff message and POF I have scheduled appts. Advised scheduler of appts and first availbale given, move appts. JMW

## 2014-09-09 ENCOUNTER — Telehealth: Payer: Self-pay | Admitting: *Deleted

## 2014-09-09 ENCOUNTER — Telehealth: Payer: Self-pay | Admitting: Oncology

## 2014-09-09 NOTE — Telephone Encounter (Signed)
Left 2nd voice mail for Margarite to call to confirm his med is approved and if we need any documentation from them that it has been approved to ensure his coverage locally to administer this. Left phone # and fax #.

## 2014-09-11 ENCOUNTER — Other Ambulatory Visit: Payer: Self-pay | Admitting: Oncology

## 2014-09-12 ENCOUNTER — Encounter: Payer: Self-pay | Admitting: Radiation Oncology

## 2014-09-12 ENCOUNTER — Other Ambulatory Visit: Payer: Self-pay | Admitting: Oncology

## 2014-09-12 ENCOUNTER — Telehealth: Payer: Self-pay | Admitting: *Deleted

## 2014-09-12 NOTE — Progress Notes (Signed)
Faxed completed [employer] forms to [place]

## 2014-09-12 NOTE — Telephone Encounter (Signed)
Called patient back to make him aware that his infusion is scheduled for tomorrow to arrive at 12:30 pm for lab/PAC access and then infusion at 1:30 pm. Expressed displeasure that he had not been called earlier about this. Plans to be here tomorrow for the treatment and says "I expect you folks to have worked all this out for me". He says the letter he received from [employer] says he has not been prescribed the FDA  approved drug for the diagnosis. Asking what this means? Letter is from his [employer] Patient Advocate, Jerrel Ivory, but there is no contact information on the letter. He agrees to bring a copy of the letter with him tomorrow. Managed Care ([place]) will follow up with his insurance company tomorrow morning to check on coverage in case he did not get approval for PAP/drug replacement. Dr. Benay Spice attempting to reach Dr. Verlan Friends again via email regarding dosing and contacting representative at [facility] that worked on the patient assistance.

## 2014-09-12 NOTE — Telephone Encounter (Signed)
error 

## 2014-09-12 NOTE — Telephone Encounter (Addendum)
Left VM requesting status of his infusion appointment for 09/14/14-has not heard from [facility] office. Was supposed to be arranged by Dr. Verlan Friends at [facility]. Says he got letter from [employer] that his request for patient assistance was denied. [facility] Patient Assistance [phone] (3rd call ) and left VM for Margarite to please call office tomorrow and/or fax proof or denial of approval of drug from [employer]. Managed care unable to reach his insurance company today due to [place].

## 2014-09-13 ENCOUNTER — Ambulatory Visit: Payer: Medicare Other

## 2014-09-13 ENCOUNTER — Telehealth: Payer: Self-pay | Admitting: *Deleted

## 2014-09-13 ENCOUNTER — Telehealth: Payer: Self-pay | Admitting: Oncology

## 2014-09-13 ENCOUNTER — Ambulatory Visit (HOSPITAL_BASED_OUTPATIENT_CLINIC_OR_DEPARTMENT_OTHER): Payer: Medicare Other

## 2014-09-13 ENCOUNTER — Other Ambulatory Visit (HOSPITAL_BASED_OUTPATIENT_CLINIC_OR_DEPARTMENT_OTHER): Payer: Medicare Other

## 2014-09-13 ENCOUNTER — Other Ambulatory Visit: Payer: Self-pay | Admitting: *Deleted

## 2014-09-13 ENCOUNTER — Other Ambulatory Visit: Payer: Medicare Other

## 2014-09-13 DIAGNOSIS — C179 Malignant neoplasm of small intestine, unspecified: Secondary | ICD-10-CM

## 2014-09-13 DIAGNOSIS — Z5112 Encounter for antineoplastic immunotherapy: Secondary | ICD-10-CM

## 2014-09-13 DIAGNOSIS — C189 Malignant neoplasm of colon, unspecified: Secondary | ICD-10-CM

## 2014-09-13 DIAGNOSIS — Z95828 Presence of other vascular implants and grafts: Secondary | ICD-10-CM

## 2014-09-13 DIAGNOSIS — Z1509 Genetic susceptibility to other malignant neoplasm: Secondary | ICD-10-CM

## 2014-09-13 LAB — CBC WITH DIFFERENTIAL/PLATELET
BASO%: 0.2 % (ref 0.0–2.0)
BASOS ABS: 0 10*3/uL (ref 0.0–0.1)
EOS%: 0.1 % (ref 0.0–7.0)
Eosinophils Absolute: 0 10*3/uL (ref 0.0–0.5)
HEMATOCRIT: 31.5 % — AB (ref 38.4–49.9)
HGB: 9.9 g/dL — ABNORMAL LOW (ref 13.0–17.1)
LYMPH#: 1 10*3/uL (ref 0.9–3.3)
LYMPH%: 5.6 % — AB (ref 14.0–49.0)
MCH: 26.2 pg — AB (ref 27.2–33.4)
MCHC: 31.4 g/dL — AB (ref 32.0–36.0)
MCV: 83.3 fL (ref 79.3–98.0)
MONO#: 2.2 10*3/uL — AB (ref 0.1–0.9)
MONO%: 11.7 % (ref 0.0–14.0)
NEUT#: 15.2 10*3/uL — ABNORMAL HIGH (ref 1.5–6.5)
NEUT%: 82.4 % — ABNORMAL HIGH (ref 39.0–75.0)
Platelets: 362 10*3/uL (ref 140–400)
RBC: 3.78 10*6/uL — ABNORMAL LOW (ref 4.20–5.82)
RDW: 19.4 % — ABNORMAL HIGH (ref 11.0–14.6)
WBC: 18.5 10*3/uL — ABNORMAL HIGH (ref 4.0–10.3)

## 2014-09-13 LAB — BASIC METABOLIC PANEL (CC13)
Anion Gap: 8 mEq/L (ref 3–11)
BUN: 12.5 mg/dL (ref 7.0–26.0)
CO2: 25 mEq/L (ref 22–29)
Calcium: 7.8 mg/dL — ABNORMAL LOW (ref 8.4–10.4)
Chloride: 104 mEq/L (ref 98–109)
Creatinine: 0.8 mg/dL (ref 0.7–1.3)
EGFR: >90 mL/min/{1.73_m2} (ref >90)
Glucose: 106 mg/dl (ref 70–140)
POTASSIUM: 4 meq/L (ref 3.5–5.1)
SODIUM: 137 meq/L (ref 136–145)

## 2014-09-13 MED ORDER — SODIUM CHLORIDE 0.9 % IV SOLN
Freq: Once | INTRAVENOUS | Status: AC
  Administered 2014-09-13: 14:00:00 via INTRAVENOUS

## 2014-09-13 MED ORDER — SODIUM CHLORIDE 0.9 % IJ SOLN
10.0000 mL | INTRAMUSCULAR | Status: DC | PRN
  Administered 2014-09-13: 10 mL via INTRAVENOUS
  Filled 2014-09-13: qty 10

## 2014-09-13 MED ORDER — PROCHLORPERAZINE MALEATE 10 MG PO TABS
ORAL_TABLET | ORAL | Status: AC
  Filled 2014-09-13: qty 1

## 2014-09-13 MED ORDER — HEPARIN SOD (PORK) LOCK FLUSH 100 UNIT/ML IV SOLN
500.0000 [IU] | Freq: Once | INTRAVENOUS | Status: AC | PRN
Start: 2014-09-13 — End: 2014-09-13
  Administered 2014-09-13: 500 [IU]
  Filled 2014-09-13: qty 5

## 2014-09-13 MED ORDER — SODIUM CHLORIDE 0.9 % IJ SOLN
10.0000 mL | INTRAMUSCULAR | Status: DC | PRN
  Administered 2014-09-13: 10 mL
  Filled 2014-09-13: qty 10

## 2014-09-13 MED ORDER — PROCHLORPERAZINE MALEATE 10 MG PO TABS
10.0000 mg | ORAL_TABLET | Freq: Once | ORAL | Status: AC
  Administered 2014-09-13: 10 mg via ORAL

## 2014-09-13 MED ORDER — SODIUM CHLORIDE 0.9 % IV SOLN
200.0000 mg | Freq: Once | INTRAVENOUS | Status: AC
  Administered 2014-09-13: 200 mg via INTRAVENOUS
  Filled 2014-09-13: qty 8

## 2014-09-13 NOTE — Telephone Encounter (Signed)
gv adn pritned appt sched and avs fo rpt for Jan and Feb...sed added tx.

## 2014-09-13 NOTE — Patient Instructions (Signed)

## 2014-09-13 NOTE — Telephone Encounter (Signed)
Spoke with pharmacist at [facility]- patient has been approved by [employer] for [employer], but since [facility] is not giving the drug there, he needs to be re-enrolled throught [facility]. Phone # at [employer] for Enrollment is [phone]. She will fax a copy of the script they sent in to [employer]. MD needs to order the exact dose that 2mg /kg would be and the company will round it up. Only comes in 100 mg vials. Forwarded form to managed care department.

## 2014-09-13 NOTE — Progress Notes (Signed)
Spoke with patient and wife in treatment area and updated on managed care research and provided copy of [employer] investigation and that PA not required with representatives name/case #. Also provided copy of form sent in to [employer] to have his PAP/drug replacement registered with Dr. Benay Spice. Instructed him to keep his follow up on 1/26 to evaluate his tolerance to the treatment and that next treatment will be scheduled for 10/04/14 with lab/office visit. He and wife understand and agree.

## 2014-09-13 NOTE — Patient Instructions (Signed)
[facility] Discharge Instructions for Patients Receiving Chemotherapy  Today you received the following chemotherapy agents: Keytruda   To help prevent nausea and vomiting after your treatment, we encourage you to take your nausea medication as directed  If you develop nausea and vomiting that is not controlled by your nausea medication, call the clinic.   BELOW ARE SYMPTOMS THAT SHOULD BE REPORTED IMMEDIATELY:  *FEVER GREATER THAN 100.5 F  *CHILLS WITH OR WITHOUT FEVER  NAUSEA AND VOMITING THAT IS NOT CONTROLLED WITH YOUR NAUSEA MEDICATION  *UNUSUAL SHORTNESS OF BREATH  *UNUSUAL BRUISING OR BLEEDING  TENDERNESS IN MOUTH AND THROAT WITH OR WITHOUT PRESENCE OF ULCERS  *URINARY PROBLEMS  *BOWEL PROBLEMS  UNUSUAL RASH Items with * indicate a potential emergency and should be followed up as soon as possible.  Feel free to call the clinic you have any questions or concerns. The clinic phone number is [phone].  

## 2014-09-20 ENCOUNTER — Other Ambulatory Visit (HOSPITAL_BASED_OUTPATIENT_CLINIC_OR_DEPARTMENT_OTHER): Payer: Medicare Other

## 2014-09-20 ENCOUNTER — Ambulatory Visit (HOSPITAL_BASED_OUTPATIENT_CLINIC_OR_DEPARTMENT_OTHER): Payer: Medicare Other

## 2014-09-20 ENCOUNTER — Ambulatory Visit (HOSPITAL_BASED_OUTPATIENT_CLINIC_OR_DEPARTMENT_OTHER): Payer: Medicare Other | Admitting: Nurse Practitioner

## 2014-09-20 VITALS — BP 116/78 | HR 112 | Temp 97.9°F | Resp 18 | Ht 71.0 in | Wt 154.0 lb

## 2014-09-20 DIAGNOSIS — D6481 Anemia due to antineoplastic chemotherapy: Secondary | ICD-10-CM

## 2014-09-20 DIAGNOSIS — Z95828 Presence of other vascular implants and grafts: Secondary | ICD-10-CM

## 2014-09-20 DIAGNOSIS — D509 Iron deficiency anemia, unspecified: Secondary | ICD-10-CM

## 2014-09-20 DIAGNOSIS — R634 Abnormal weight loss: Secondary | ICD-10-CM

## 2014-09-20 DIAGNOSIS — C179 Malignant neoplasm of small intestine, unspecified: Secondary | ICD-10-CM

## 2014-09-20 DIAGNOSIS — N19 Unspecified kidney failure: Secondary | ICD-10-CM

## 2014-09-20 DIAGNOSIS — C189 Malignant neoplasm of colon, unspecified: Secondary | ICD-10-CM

## 2014-09-20 DIAGNOSIS — D638 Anemia in other chronic diseases classified elsewhere: Secondary | ICD-10-CM

## 2014-09-20 DIAGNOSIS — R188 Other ascites: Secondary | ICD-10-CM

## 2014-09-20 DIAGNOSIS — C787 Secondary malignant neoplasm of liver and intrahepatic bile duct: Secondary | ICD-10-CM

## 2014-09-20 DIAGNOSIS — Z800 Family history of malignant neoplasm of digestive organs: Secondary | ICD-10-CM

## 2014-09-20 DIAGNOSIS — R630 Anorexia: Secondary | ICD-10-CM

## 2014-09-20 DIAGNOSIS — E876 Hypokalemia: Secondary | ICD-10-CM

## 2014-09-20 DIAGNOSIS — I81 Portal vein thrombosis: Secondary | ICD-10-CM

## 2014-09-20 DIAGNOSIS — Z85038 Personal history of other malignant neoplasm of large intestine: Secondary | ICD-10-CM

## 2014-09-20 LAB — CBC WITH DIFFERENTIAL/PLATELET
BASO%: 0.2 % (ref 0.0–2.0)
BASOS ABS: 0.1 10*3/uL (ref 0.0–0.1)
EOS ABS: 0 10*3/uL (ref 0.0–0.5)
EOS%: 0 % (ref 0.0–7.0)
HCT: 34.1 % — ABNORMAL LOW (ref 38.4–49.9)
HEMOGLOBIN: 10.8 g/dL — AB (ref 13.0–17.1)
LYMPH%: 3.7 % — ABNORMAL LOW (ref 14.0–49.0)
MCH: 26.3 pg — AB (ref 27.2–33.4)
MCHC: 31.7 g/dL — ABNORMAL LOW (ref 32.0–36.0)
MCV: 83.2 fL (ref 79.3–98.0)
MONO#: 1.9 10*3/uL — ABNORMAL HIGH (ref 0.1–0.9)
MONO%: 7.4 % (ref 0.0–14.0)
NEUT%: 88.7 % — ABNORMAL HIGH (ref 39.0–75.0)
NEUTROS ABS: 22.5 10*3/uL — AB (ref 1.5–6.5)
Platelets: 444 10*3/uL — ABNORMAL HIGH (ref 140–400)
RBC: 4.1 10*6/uL — ABNORMAL LOW (ref 4.20–5.82)
RDW: 19.7 % — ABNORMAL HIGH (ref 11.0–14.6)
WBC: 25.4 10*3/uL — ABNORMAL HIGH (ref 4.0–10.3)
lymph#: 0.9 10*3/uL (ref 0.9–3.3)

## 2014-09-20 MED ORDER — SODIUM CHLORIDE 0.9 % IJ SOLN
10.0000 mL | INTRAMUSCULAR | Status: DC | PRN
  Administered 2014-09-20: 10 mL via INTRAVENOUS
  Filled 2014-09-20: qty 10

## 2014-09-20 MED ORDER — HEPARIN SOD (PORK) LOCK FLUSH 100 UNIT/ML IV SOLN
500.0000 [IU] | Freq: Once | INTRAVENOUS | Status: AC
  Administered 2014-09-20: 500 [IU] via INTRAVENOUS
  Filled 2014-09-20: qty 5

## 2014-09-20 MED ORDER — LIDOCAINE-PRILOCAINE 2.5-2.5 % EX CREA: TOPICAL_CREAM | CUTANEOUS | Status: AC

## 2014-09-20 MED ORDER — MEGESTROL ACETATE 40 MG/ML PO SUSP: 200.0000 mg | Freq: Two times a day (BID) | ORAL | Status: DC

## 2014-09-20 MED ORDER — OXYCODONE-ACETAMINOPHEN 5-325 MG PO TABS: 1.0000 | ORAL_TABLET | Freq: Four times a day (QID) | ORAL | Status: DC | PRN

## 2014-09-20 NOTE — Patient Instructions (Signed)

## 2014-09-20 NOTE — Progress Notes (Signed)
[facility] OFFICE PROGRESS NOTE   Diagnosis:  Small bowel carcinoma  INTERVAL HISTORY:   Dean Owens returns as scheduled. He completed cycle 1 Pembrolizumab 09/13/2014. Abdomen/back pain is better. He continues Percocet as needed. No nausea or vomiting. No rash. No diarrhea. He denies shortness of breath and cough. No urinary symptoms. Appetite is poor. He intermittently notes extreme fatigue.  Objective:  Vital signs in last 24 hours:  Blood pressure 116/78, pulse 112, temperature 97.9 F (36.6 C), temperature source Oral, resp. rate 18, height 5' 11"  (1.803 m), weight 154 lb (69.854 kg), SpO2 100 %. repeat heart rate 100    HEENT: No thrush or ulcers. Bilateral buccal regions erythematous. Resp: Lungs clear bilaterally. Cardio: Regular rate and rhythm. GI: Abdomen is soft. No hepatomegaly. Vascular: No leg edema.  Skin: No rash. Port-A-Cath without erythema.    Lab Results:  Lab Results  Component Value Date   WBC 25.4* 09/20/2014   HGB 10.8* 09/20/2014   HCT 34.1* 09/20/2014   MCV 83.2 09/20/2014   PLT 444* 09/20/2014   NEUTROABS 22.5* 09/20/2014    Imaging:  No results found.  Medications: I have reviewed the patient's current medications.  Assessment/Plan: 1.Adenocarcinoma of the small bowel, stage III (T3 N1), status post a partial small bowel resection 10/14/2013   The tumor returned microsatellite instability high with loss of MSH2 expression   MRI of the liver 05/28/2014 with multiple hepatic metastases and portal/retroperitoneal lymphadenopathy, ultrasound-guided biopsy of the liver 05/31/2014 confirmed metastatic small bowel carcinoma   Cycle 1 FOLFOX 06/08/2014  Cycle 2 FOLFOX with Neulasta support 06/29/2014  Cycle 3 FOLFOX 07/13/2014  Cycle 4 FOLFOX 08/03/2014  Cycle 5 FOLFOX 08/22/2014  Restaging CT 09/02/2014 revealed progression of abdominal lymphadenopathy and peritoneal carcinomatosis  Cycle 1 Pembrolizumab 2.  Colon cancer X3, 1997 and 1999, status post an intra-abdominal colectomy with ileorectal anastomosis in May 1999  3. Iron deficiency anemia  4. Diabetes  5. Hypothyroidism  6. Hypertension  7. 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 Ascites-likely secondary to portal hypertension versus carcinomatosis, repeat peritoneal fluid cytologies negative for tumor cells  10. 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 Right neck pain-most likely secondary to a benign musculoskeletal condition, CT of the neck 06/06/2014 with degenerative changes, no evidence of metastatic disease  12. Renal failure-likely related to carcinomatosis and hepatic metastases, he saw nephrology 06/07/2014, improved  13. Nausea and vomiting following cycle 1 FOLFOX 14. Neutropenia following cycle one FOLFOX, Neulasta added with cycle 2. He declined further Neulasta. 15. 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 Hypokalemia-likely secondary to diarrhea 17. Pain-likely secondary to metastatic lymphadenopathy and carcinomatosis, relieved with oxycodone   Disposition: Dean Owens has completed 1 cycle of Pembrolizumab. He has noted improvement in the abdomen/back pain. He is scheduled for cycle 2 on 10/04/2014.  For the anorexia/weight loss he would like to try an appetite stimulant. He has tried steroids in the past with no benefit. A prescription was sent to his pharmacy for Megace 200 mg twice daily. We discussed that there is an increased risk of  blood clots associated with Megace.  We will see him in follow-up prior to treatment on 10/04/2014. He will contact the office in the interim with any problems.  Plan reviewed with Dr. Benay Spice.    [name] ANP/GNP-BC   09/20/2014  12:24 PM

## 2014-09-21 ENCOUNTER — Encounter: Payer: Self-pay | Admitting: Oncology

## 2014-09-21 NOTE — Progress Notes (Signed)
Faxed megestrol pa form to [employer]

## 2014-09-27 ENCOUNTER — Telehealth: Payer: Self-pay

## 2014-09-27 NOTE — Telephone Encounter (Signed)
Dean Owens called re having trouble with insurance about the megace. Wanted refill on thyroid medication. Wanted an appt by herself with Lattie Haw or Dr Benay Spice. S/w Dr Benay Spice, we do not refill his thyroid-the original presciber does. Dr Benay Spice will call her. Also a note was in chart that a PA for megace was faxed to [facility] on 1/27. She needs to call [employer] to find out where it stands. Dean Owens spoke back the instructions. In basket to Dr Benay Spice to call Dean Owens later.

## 2014-09-28 ENCOUNTER — Telehealth: Payer: Self-pay | Admitting: Oncology

## 2014-09-28 ENCOUNTER — Other Ambulatory Visit: Payer: Self-pay | Admitting: *Deleted

## 2014-09-28 NOTE — Telephone Encounter (Signed)
Patient confirmed appointment.

## 2014-09-29 ENCOUNTER — Other Ambulatory Visit (HOSPITAL_BASED_OUTPATIENT_CLINIC_OR_DEPARTMENT_OTHER): Payer: Medicare Other

## 2014-09-29 ENCOUNTER — Ambulatory Visit (HOSPITAL_BASED_OUTPATIENT_CLINIC_OR_DEPARTMENT_OTHER): Payer: Medicare Other

## 2014-09-29 ENCOUNTER — Ambulatory Visit (HOSPITAL_BASED_OUTPATIENT_CLINIC_OR_DEPARTMENT_OTHER): Payer: Medicare Other | Admitting: Nurse Practitioner

## 2014-09-29 ENCOUNTER — Telehealth: Payer: Self-pay | Admitting: Oncology

## 2014-09-29 VITALS — BP 130/80 | HR 96 | Temp 97.5°F | Resp 18 | Ht 71.0 in | Wt 145.0 lb

## 2014-09-29 DIAGNOSIS — C179 Malignant neoplasm of small intestine, unspecified: Secondary | ICD-10-CM

## 2014-09-29 DIAGNOSIS — C787 Secondary malignant neoplasm of liver and intrahepatic bile duct: Secondary | ICD-10-CM

## 2014-09-29 DIAGNOSIS — E039 Hypothyroidism, unspecified: Secondary | ICD-10-CM

## 2014-09-29 DIAGNOSIS — E119 Type 2 diabetes mellitus without complications: Secondary | ICD-10-CM

## 2014-09-29 DIAGNOSIS — Z95828 Presence of other vascular implants and grafts: Secondary | ICD-10-CM

## 2014-09-29 LAB — COMPREHENSIVE METABOLIC PANEL (CC13)
ALBUMIN: 1.6 g/dL — AB (ref 3.5–5.0)
ALT: 16 U/L (ref 0–55)
AST: 28 U/L (ref 5–34)
Alkaline Phosphatase: 719 U/L — ABNORMAL HIGH (ref 40–150)
Anion Gap: 9 mEq/L (ref 3–11)
BILIRUBIN TOTAL: 1.05 mg/dL (ref 0.20–1.20)
BUN: 15.3 mg/dL (ref 7.0–26.0)
CO2: 23 mEq/L (ref 22–29)
Calcium: 7.7 mg/dL — ABNORMAL LOW (ref 8.4–10.4)
Chloride: 103 mEq/L (ref 98–109)
Creatinine: 0.8 mg/dL (ref 0.7–1.3)
GLUCOSE: 111 mg/dL (ref 70–140)
Potassium: 3.9 mEq/L (ref 3.5–5.1)
Sodium: 136 mEq/L (ref 136–145)
Total Protein: 6.3 g/dL — ABNORMAL LOW (ref 6.4–8.3)

## 2014-09-29 LAB — CBC WITH DIFFERENTIAL/PLATELET
BASO%: 0.3 % (ref 0.0–2.0)
Basophils Absolute: 0.1 10*3/uL (ref 0.0–0.1)
EOS%: 0.2 % (ref 0.0–7.0)
Eosinophils Absolute: 0.1 10*3/uL (ref 0.0–0.5)
HEMATOCRIT: 30.9 % — AB (ref 38.4–49.9)
HEMOGLOBIN: 9.8 g/dL — AB (ref 13.0–17.1)
LYMPH%: 3.7 % — AB (ref 14.0–49.0)
MCH: 27 pg — ABNORMAL LOW (ref 27.2–33.4)
MCHC: 31.7 g/dL — ABNORMAL LOW (ref 32.0–36.0)
MCV: 85.1 fL (ref 79.3–98.0)
MONO#: 1.4 10*3/uL — ABNORMAL HIGH (ref 0.1–0.9)
MONO%: 6.6 % (ref 0.0–14.0)
NEUT#: 18.8 10*3/uL — ABNORMAL HIGH (ref 1.5–6.5)
NEUT%: 89.2 % — ABNORMAL HIGH (ref 39.0–75.0)
PLATELETS: 379 10*3/uL (ref 140–400)
RBC: 3.63 10*6/uL — AB (ref 4.20–5.82)
RDW: 21.6 % — ABNORMAL HIGH (ref 11.0–14.6)
WBC: 21.1 10*3/uL — AB (ref 4.0–10.3)
lymph#: 0.8 10*3/uL — ABNORMAL LOW (ref 0.9–3.3)

## 2014-09-29 MED ORDER — SODIUM CHLORIDE 0.9 % IJ SOLN
10.0000 mL | INTRAMUSCULAR | Status: DC | PRN
  Administered 2014-09-29: 10 mL via INTRAVENOUS
  Filled 2014-09-29: qty 10

## 2014-09-29 MED ORDER — HEPARIN SOD (PORK) LOCK FLUSH 100 UNIT/ML IV SOLN
500.0000 [IU] | Freq: Once | INTRAVENOUS | Status: AC
  Administered 2014-09-29: 500 [IU] via INTRAVENOUS
  Filled 2014-09-29: qty 5

## 2014-09-29 NOTE — Patient Instructions (Signed)

## 2014-09-29 NOTE — Progress Notes (Addendum)
[facility] OFFICE PROGRESS NOTE   Diagnosis:  Small bowel carcinoma  INTERVAL HISTORY:   Dean Owens returns prior to scheduled follow-up for evaluation of increased fatigue and anorexia. He completed cycle 1 Pembrolizumab 09/13/2014. His wife reports he has been sleeping more. Appetite is poor. He is experiencing early satiety. He has lost weight since his last visit. He rarely has nausea/vomiting. Bowels are moving. He reports his abdominal pain is controlled with scheduled oxycodone. He denies fever.  Objective:  Vital signs in last 24 hours:  Blood pressure 130/80, pulse 96, temperature 97.5 F (36.4 C), temperature source Oral, resp. rate 18, height 5' 11"  (1.803 m), weight 145 lb (65.772 kg), SpO2 100 %.    HEENT: No thrush or ulcers. Mucous membranes appear moist. Resp: Lungs clear bilaterally. Cardio: Regular rate and rhythm. GI: Abdomen is soft. Fullness at the medial right upper quadrant. Vascular: No leg edema.  Skin: Skin is dry appearing. Port-A-Cath without erythema.    Lab Results:  Lab Results  Component Value Date   WBC 25.4* 09/20/2014   HGB 10.8* 09/20/2014   HCT 34.1* 09/20/2014   MCV 83.2 09/20/2014   PLT 444* 09/20/2014   NEUTROABS 22.5* 09/20/2014    Imaging:  No results found.  Medications: I have reviewed the patient's current medications.  Assessment/Plan: 1.Adenocarcinoma of the small bowel, stage III (T3 N1), status post a partial small bowel resection 10/14/2013   The tumor returned microsatellite instability high with loss of MSH2 expression   MRI of the liver 05/28/2014 with multiple hepatic metastases and portal/retroperitoneal lymphadenopathy, ultrasound-guided biopsy of the liver 05/31/2014 confirmed metastatic small bowel carcinoma   Cycle 1 FOLFOX 06/08/2014  Cycle 2 FOLFOX with Neulasta support 06/29/2014  Cycle 3 FOLFOX 07/13/2014  Cycle 4 FOLFOX 08/03/2014  Cycle 5 FOLFOX 08/22/2014  Restaging CT  09/02/2014 revealed progression of abdominal lymphadenopathy and peritoneal carcinomatosis  Cycle 1 Pembrolizumab 2. Colon cancer X3, 1997 and 1999, status post an intra-abdominal colectomy with ileorectal anastomosis in May 1999  3. Iron deficiency anemia  4. Diabetes  5. Hypothyroidism  6. Hypertension  7. 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 Ascites-likely secondary to portal hypertension versus carcinomatosis, repeat peritoneal fluid cytologies negative for tumor cells  10. 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 Right neck pain-most likely secondary to a benign musculoskeletal condition, CT of the neck 06/06/2014 with degenerative changes, no evidence of metastatic disease  12. Renal failure-likely related to carcinomatosis and hepatic metastases, he saw nephrology 06/07/2014, improved  13. Nausea and vomiting following cycle 1 FOLFOX 14. Neutropenia following cycle one FOLFOX, Neulasta added with cycle 2. He declined further Neulasta. 15. 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 Hypokalemia-likely secondary to diarrhea 17. Pain-likely secondary to metastatic lymphadenopathy and carcinomatosis, relieved with oxycodone    Disposition: Dean Owens has a poor performance status/failure to thrive. He and his wife understand this is most likely due to the cancer. We will obtain some basic labs today to include a CBC and chemistry panel. We instructed him to discontinue Lasix. He will try to increase  oral intake/push fluids.  He will keep his scheduled visit on 10/04/2014 and contact the office in the interim with further problems.  Patient seen with Dr. Benay Spice. 25 minutes were spent face-to-face at today's visit with the majority of that time involved in counseling/coordination of care.  [name] ANP/GNP-BC   09/29/2014  9:53 AM  This was a shared visit with  [name]. Dean Owens was interviewed and examined.  I am concerned his symptoms are related to progressive cancer.  He will return as scheduled next week.  Julieanne Manson, MD

## 2014-09-29 NOTE — Telephone Encounter (Signed)
pt sent back to lab and given avs and appts for 2/9.

## 2014-09-29 NOTE — Progress Notes (Signed)
Called Donnetta Hutching, Nutritionist, LM pt has lost 9 pounds since last visit, pt has been drinking carnation, wife has some concerns on how many he should be drinking and/or if they should be doing anything else/

## 2014-10-02 ENCOUNTER — Other Ambulatory Visit: Payer: Self-pay | Admitting: Oncology

## 2014-10-04 ENCOUNTER — Telehealth: Payer: Self-pay | Admitting: Oncology

## 2014-10-04 ENCOUNTER — Ambulatory Visit (HOSPITAL_BASED_OUTPATIENT_CLINIC_OR_DEPARTMENT_OTHER): Payer: Medicare Other | Admitting: Oncology

## 2014-10-04 ENCOUNTER — Ambulatory Visit: Payer: Medicare Other

## 2014-10-04 ENCOUNTER — Telehealth: Payer: Self-pay | Admitting: *Deleted

## 2014-10-04 ENCOUNTER — Ambulatory Visit (HOSPITAL_BASED_OUTPATIENT_CLINIC_OR_DEPARTMENT_OTHER): Payer: Medicare Other

## 2014-10-04 ENCOUNTER — Other Ambulatory Visit (HOSPITAL_BASED_OUTPATIENT_CLINIC_OR_DEPARTMENT_OTHER): Payer: Medicare Other

## 2014-10-04 VITALS — BP 121/77 | HR 102 | Temp 98.3°F | Resp 18 | Ht 71.0 in | Wt 150.3 lb

## 2014-10-04 DIAGNOSIS — I81 Portal vein thrombosis: Secondary | ICD-10-CM

## 2014-10-04 DIAGNOSIS — C179 Malignant neoplasm of small intestine, unspecified: Secondary | ICD-10-CM

## 2014-10-04 DIAGNOSIS — E039 Hypothyroidism, unspecified: Secondary | ICD-10-CM

## 2014-10-04 DIAGNOSIS — Z1509 Genetic susceptibility to other malignant neoplasm: Secondary | ICD-10-CM

## 2014-10-04 DIAGNOSIS — C787 Secondary malignant neoplasm of liver and intrahepatic bile duct: Secondary | ICD-10-CM

## 2014-10-04 DIAGNOSIS — C189 Malignant neoplasm of colon, unspecified: Secondary | ICD-10-CM

## 2014-10-04 DIAGNOSIS — G893 Neoplasm related pain (acute) (chronic): Secondary | ICD-10-CM

## 2014-10-04 DIAGNOSIS — Z95828 Presence of other vascular implants and grafts: Secondary | ICD-10-CM

## 2014-10-04 DIAGNOSIS — Z5112 Encounter for antineoplastic immunotherapy: Secondary | ICD-10-CM

## 2014-10-04 DIAGNOSIS — D6481 Anemia due to antineoplastic chemotherapy: Secondary | ICD-10-CM

## 2014-10-04 DIAGNOSIS — D638 Anemia in other chronic diseases classified elsewhere: Secondary | ICD-10-CM

## 2014-10-04 LAB — CBC WITH DIFFERENTIAL/PLATELET
BASO%: 0.2 % (ref 0.0–2.0)
Basophils Absolute: 0.1 10*3/uL (ref 0.0–0.1)
EOS%: 0.1 % (ref 0.0–7.0)
Eosinophils Absolute: 0 10*3/uL (ref 0.0–0.5)
HCT: 30.1 % — ABNORMAL LOW (ref 38.4–49.9)
HGB: 9.4 g/dL — ABNORMAL LOW (ref 13.0–17.1)
LYMPH%: 3.8 % — AB (ref 14.0–49.0)
MCH: 26.6 pg — AB (ref 27.2–33.4)
MCHC: 31.1 g/dL — AB (ref 32.0–36.0)
MCV: 85.4 fL (ref 79.3–98.0)
MONO#: 1.4 10*3/uL — ABNORMAL HIGH (ref 0.1–0.9)
MONO%: 6.5 % (ref 0.0–14.0)
NEUT#: 19.2 10*3/uL — ABNORMAL HIGH (ref 1.5–6.5)
NEUT%: 89.4 % — AB (ref 39.0–75.0)
PLATELETS: 511 10*3/uL — AB (ref 140–400)
RBC: 3.52 10*6/uL — ABNORMAL LOW (ref 4.20–5.82)
RDW: 24.1 % — ABNORMAL HIGH (ref 11.0–14.6)
WBC: 21.4 10*3/uL — ABNORMAL HIGH (ref 4.0–10.3)
lymph#: 0.8 10*3/uL — ABNORMAL LOW (ref 0.9–3.3)

## 2014-10-04 LAB — COMPREHENSIVE METABOLIC PANEL (CC13)
ALK PHOS: 881 U/L — AB (ref 40–150)
ALT: 19 U/L (ref 0–55)
AST: 31 U/L (ref 5–34)
Albumin: 1.5 g/dL — ABNORMAL LOW (ref 3.5–5.0)
Anion Gap: 9 mEq/L (ref 3–11)
BILIRUBIN TOTAL: 0.91 mg/dL (ref 0.20–1.20)
BUN: 12.5 mg/dL (ref 7.0–26.0)
CALCIUM: 7.7 mg/dL — AB (ref 8.4–10.4)
CHLORIDE: 105 meq/L (ref 98–109)
CO2: 24 mEq/L (ref 22–29)
CREATININE: 0.7 mg/dL (ref 0.7–1.3)
EGFR: >90 mL/min/{1.73_m2} (ref >90)
Glucose: 108 mg/dl (ref 70–140)
Potassium: 4 mEq/L (ref 3.5–5.1)
Sodium: 137 mEq/L (ref 136–145)
Total Protein: 6.1 g/dL — ABNORMAL LOW (ref 6.4–8.3)

## 2014-10-04 MED ORDER — PROCHLORPERAZINE MALEATE 10 MG PO TABS
10.0000 mg | ORAL_TABLET | Freq: Once | ORAL | Status: AC
  Administered 2014-10-04: 10 mg via ORAL

## 2014-10-04 MED ORDER — PROCHLORPERAZINE MALEATE 10 MG PO TABS
ORAL_TABLET | ORAL | Status: AC
Start: 2014-10-04 — End: 2014-10-04
  Filled 2014-10-04: qty 1

## 2014-10-04 MED ORDER — SODIUM CHLORIDE 0.9 % IV SOLN
200.0000 mg | Freq: Once | INTRAVENOUS | Status: AC
  Administered 2014-10-04: 200 mg via INTRAVENOUS
  Filled 2014-10-04: qty 8

## 2014-10-04 MED ORDER — HEPARIN SOD (PORK) LOCK FLUSH 100 UNIT/ML IV SOLN
500.0000 [IU] | Freq: Once | INTRAVENOUS | Status: AC | PRN
  Administered 2014-10-04: 500 [IU]
  Filled 2014-10-04: qty 5

## 2014-10-04 MED ORDER — SODIUM CHLORIDE 0.9 % IV SOLN
Freq: Once | INTRAVENOUS | Status: AC
  Administered 2014-10-04: 10:00:00 via INTRAVENOUS

## 2014-10-04 MED ORDER — SODIUM CHLORIDE 0.9 % IJ SOLN
10.0000 mL | INTRAMUSCULAR | Status: DC | PRN
  Administered 2014-10-04: 10 mL
  Filled 2014-10-04: qty 10

## 2014-10-04 MED ORDER — SODIUM CHLORIDE 0.9 % IJ SOLN
10.0000 mL | INTRAMUSCULAR | Status: DC | PRN
  Administered 2014-10-04: 10 mL via INTRAVENOUS
  Filled 2014-10-04: qty 10

## 2014-10-04 MED ORDER — OXYCODONE-ACETAMINOPHEN 5-325 MG PO TABS: 1.0000 | ORAL_TABLET | Freq: Four times a day (QID) | ORAL | Status: DC | PRN

## 2014-10-04 NOTE — Telephone Encounter (Signed)
Pt confirmed labs/ov per 02/09 POF, gave pt AVS.... KJ, sent msg to add chemo °

## 2014-10-04 NOTE — Telephone Encounter (Signed)
Per staff message and POF I have scheduled appts. Advised scheduler of appts. JMW  

## 2014-10-04 NOTE — Patient Instructions (Signed)

## 2014-10-04 NOTE — Patient Instructions (Signed)
[facility] Discharge Instructions for Patients Receiving Chemotherapy  Today you received the following chemotherapy agents Keytruda  To help prevent nausea and vomiting after your treatment, we encourage you to take your nausea medication as directed.   If you develop nausea and vomiting that is not controlled by your nausea medication, call the clinic.   BELOW ARE SYMPTOMS THAT SHOULD BE REPORTED IMMEDIATELY:  *FEVER GREATER THAN 100.5 F  *CHILLS WITH OR WITHOUT FEVER  NAUSEA AND VOMITING THAT IS NOT CONTROLLED WITH YOUR NAUSEA MEDICATION  *UNUSUAL SHORTNESS OF BREATH  *UNUSUAL BRUISING OR BLEEDING  TENDERNESS IN MOUTH AND THROAT WITH OR WITHOUT PRESENCE OF ULCERS  *URINARY PROBLEMS  *BOWEL PROBLEMS  UNUSUAL RASH Items with * indicate a potential emergency and should be followed up as soon as possible.  Feel free to call the clinic you have any questions or concerns. The clinic phone number is [phone].        Pembrolizumab injection What is this medicine? PEMBROLIZUMAB (pem broe liz ue mab) is used to treat certain types of melanoma, a skin cancer. It targets specific cancer cells and stops the cancer cells from growing. This medicine may be used for other purposes; ask your health care provider or pharmacist if you have questions. COMMON BRAND NAME(S): Keytruda What should I tell my health care provider before I take this medicine? They need to know if you have any of these conditions: -immune system problems -inflammatory bowel disease -liver disease -lung or breathing disease -lupus -an unusual or allergic reaction to pembrolizumab, other medicines, foods, dyes, or preservatives -pregnant or trying to get pregnant -breast-feeding How should I use this medicine? This medicine is for infusion into a vein. It is given by a health care professional in a hospital or clinic setting. A special MedGuide will be given to you before each  treatment. Be sure to read this information carefully each time. Talk to your pediatrician regarding the use of this medicine in children. Special care may be needed. Overdosage: If you think you've taken too much of this medicine contact a poison control center or emergency room at once. Overdosage: If you think you have taken too much of this medicine contact a poison control center or emergency room at once. NOTE: This medicine is only for you. Do not share this medicine with others. What if I miss a dose? It is important not to miss your dose. Call your doctor or health care professional if you are unable to keep an appointment. What may interact with this medicine? Interactions have not been studied. Give your health care provider a list of all the medicines, herbs, non-prescription drugs, or dietary supplements you use. Also tell them if you smoke, drink alcohol, or use illegal drugs. Some items may interact with your medicine. This list may not describe all possible interactions. Give your health care provider a list of all the medicines, herbs, non-prescription drugs, or dietary supplements you use. Also tell them if you smoke, drink alcohol, or use illegal drugs. Some items may interact with your medicine. What should I watch for while using this medicine? Your condition will be monitored carefully while you are receiving this medicine. You may need blood work done while you are taking this medicine. Do not become pregnant while taking this medicine or for 4 months after stopping it. Women should inform their doctor if they wish to become pregnant or think they might be pregnant. There is a potential for serious side  effects to an unborn child. Talk to your health care professional or pharmacist for more information. Do not breast-feed an infant while taking this medicine. What side effects may I notice from receiving this medicine? Side effects that you should report to your doctor or  health care professional as soon as possible: -allergic reactions like skin rash, itching or hives, swelling of the face, lips, or tongue -bloody or black, tarry stools -change in the amount of urine -changes in vision -chest pain -dark urine -dizziness or feeling faint or lightheaded -fast or irregular heartbeat -hair loss -muscle pain -muscle weakness -persistent headache -shortness of breath -signs and symptoms of liver injury like dark urine, light-colored stools, loss of appetite, nausea, right upper belly pain, yellowing of the eyes or skin -stomach pain -weight loss Side effects that usually do not require medical attention (Report these to your doctor or health care professional if they continue or are bothersome.):constipation -cough -diarrhea -joint pain -tiredness This list may not describe all possible side effects. Call your doctor for medical advice about side effects. You may report side effects to FDA at 1-800-FDA-1088. Where should I keep my medicine? This drug is given in a hospital or clinic and will not be stored at home. NOTE: This sheet is a summary. It may not cover all possible information. If you have questions about this medicine, talk to your doctor, pharmacist, or health care provider.  2015, Elsevier/Gold Standard. (2013-05-06 12:52:03)

## 2014-10-04 NOTE — Progress Notes (Signed)
Dean Owens   Diagnosis: Liposarcoma  INTERVAL HISTORY:   Dean Owens returns as scheduled. He continues to have right abdomen/flank pain that is relieved with oxycodone. He takes oxycodone approximately 3 times per day. He was more active last weekend, but stayed in bed most of the day yesterday. No rash or diarrhea. He takes 2 cans of Ensure per day. He is able to ambulate in the home.  Objective:  Vital signs in last 24 hours:  Blood pressure 121/77, pulse 102, temperature 98.3 F (36.8 C), temperature source Oral, resp. rate 18, height 5' 11"  (1.803 m), weight 150 lb 4.8 oz (68.176 kg), SpO2 99 %.    HEENT: No thrush or ulcers Resp: Lungs clear bilaterally Cardio: Regular rate and rhythm GI: No hepatomegaly, nontender, no apparent ascites Vascular: No leg edema Neuro: Alert and oriented, ambulates the examination table     Portacath/PICC-without erythema  Lab Results:  Lab Results  Component Value Date   WBC 21.4* 10/04/2014   HGB 9.4* 10/04/2014   HCT 30.1* 10/04/2014   MCV 85.4 10/04/2014   PLT 511* 10/04/2014   NEUTROABS 19.2* 10/04/2014      Lab Results  Component Value Date   CEA 3.8 09/06/2014    Medications: I have reviewed the patient's current medications.  Assessment/Plan: 1.Adenocarcinoma of the small bowel, stage III (T3 N1), status post a partial small bowel resection 10/14/2013   The tumor returned microsatellite instability high with loss of MSH2 expression   MRI of the liver 05/28/2014 with multiple hepatic metastases and portal/retroperitoneal lymphadenopathy, ultrasound-guided biopsy of the liver 05/31/2014 confirmed metastatic small bowel carcinoma   Cycle 1 FOLFOX 06/08/2014  Cycle 2 FOLFOX with Neulasta support 06/29/2014  Cycle 3 FOLFOX 07/13/2014  Cycle 4 FOLFOX 08/03/2014  Cycle 5 FOLFOX 08/22/2014  Restaging CT 09/02/2014 revealed progression of abdominal lymphadenopathy and  peritoneal carcinomatosis  Cycle 1 Pembrolizumab 2. Colon cancer X3, 1997 and 1999, status post an intra-abdominal colectomy with ileorectal anastomosis in May 1999  3. History of Iron deficiency anemia  4. Diabetes  5. Hypothyroidism  6. Hypertension  7. Family history of colon cancer-he has hereditary non-polyposis colon cancer syndrome  8. 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 Ascites-likely secondary to portal hypertension versus carcinomatosis, repeat peritoneal fluid cytologies negative for tumor cells  10. 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 Right neck pain-most likely secondary to a benign musculoskeletal condition, CT of the neck 06/06/2014 with degenerative changes, no evidence of metastatic disease  12. Renal failure-likely related to carcinomatosis and hepatic metastases, he saw nephrology 06/07/2014, improved  13. Nausea and vomiting following cycle 1 FOLFOX 14. Neutropenia following cycle one FOLFOX, Neulasta added with cycle 2. He declined further Neulasta. 15. 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 History of Hypokalemia-likely secondary to diarrhea 17. Pain-likely secondary to metastatic lymphadenopathy and carcinomatosis, or tumor flare following pembrolizumab    Disposition:  His overall performance status appears improved today. Hopefully the increased pain over the past few weeks was related to a tumor flare from the PD1 inhibitor. The plan is to proceed with cycle 2 today.  I encouraged him to increase his ambulation  and diet. He will return for an office visit and cycle 3  pembrulizumab in 3 weeks. Dean Owens will contact us in the interim as needed.  Betsy Coder, MD  10/04/2014  9:45 AM

## 2014-10-05 ENCOUNTER — Encounter: Payer: Self-pay | Admitting: Oncology

## 2014-10-05 ENCOUNTER — Telehealth: Payer: Self-pay | Admitting: Nurse Practitioner

## 2014-10-05 ENCOUNTER — Telehealth: Payer: Self-pay | Admitting: *Deleted

## 2014-10-05 NOTE — Telephone Encounter (Signed)
I contacted Dean Owens regarding his earlier call. He reports Megace has been denied due to being prescribed for "nausea and vomiting". I explained to him that it was prescribed for anorexia/weight loss. He requested that we file an appeal. He also requested that I contact a customer service representative at [facility] for further information. I confirmed with a customer service representative that Megace was denied. She reports a letter was sent to our office. We will forward this information to our managed care department.

## 2014-10-05 NOTE — Progress Notes (Signed)
Faxed megestrol appeal letter and form to [facility] @ [phone]

## 2014-10-05 NOTE — Telephone Encounter (Signed)
Patient called and requested that [name] NP call him re: prescription denied from [employer].  Call back is [phone].  Will route to [name] NP

## 2014-10-12 ENCOUNTER — Telehealth: Payer: Self-pay | Admitting: *Deleted

## 2014-10-12 NOTE — Telephone Encounter (Signed)
Per Dr. Benay Spice; contacted pt's wife re: pt experiencing "severe abdominal pain" and requesting pt be seen in office.  Pt's wife states that she wants appt tomorrow and pt is taking his meds to help control the pain; "I think he will be Ok until you can see him tomorrow"  Explained to pt that office will call in am with time to be seen with [name], NP but if symptoms worse this evening--go to ED for evaluation.  Pt's wife verbalized understanding and states she "would take him tonight if worse."  Note to [name], NP

## 2014-10-13 ENCOUNTER — Ambulatory Visit (HOSPITAL_BASED_OUTPATIENT_CLINIC_OR_DEPARTMENT_OTHER): Payer: Medicare Other | Admitting: Nurse Practitioner

## 2014-10-13 ENCOUNTER — Emergency Department (HOSPITAL_COMMUNITY): Payer: Medicare Other

## 2014-10-13 ENCOUNTER — Encounter (HOSPITAL_COMMUNITY): Payer: Self-pay | Admitting: Emergency Medicine

## 2014-10-13 ENCOUNTER — Inpatient Hospital Stay (HOSPITAL_COMMUNITY)
Admission: EM | Admit: 2014-10-13 | Discharge: 2014-10-20 | DRG: 374 | Disposition: A | Discharge status: Home Health | Payer: Medicare Other | Attending: Internal Medicine | Admitting: Internal Medicine

## 2014-10-13 ENCOUNTER — Telehealth: Payer: Self-pay | Admitting: Nurse Practitioner

## 2014-10-13 VITALS — BP 132/88 | HR 118 | Temp 97.8°F | Resp 18 | Ht 71.0 in | Wt 145.6 lb

## 2014-10-13 DIAGNOSIS — E43 Unspecified severe protein-calorie malnutrition: Secondary | ICD-10-CM

## 2014-10-13 DIAGNOSIS — K521 Toxic gastroenteritis and colitis: Secondary | ICD-10-CM | POA: Diagnosis present

## 2014-10-13 DIAGNOSIS — D6481 Anemia due to antineoplastic chemotherapy: Secondary | ICD-10-CM | POA: Diagnosis present

## 2014-10-13 DIAGNOSIS — D509 Iron deficiency anemia, unspecified: Secondary | ICD-10-CM | POA: Diagnosis present

## 2014-10-13 DIAGNOSIS — Z1509 Genetic susceptibility to other malignant neoplasm: Secondary | ICD-10-CM | POA: Diagnosis not present

## 2014-10-13 DIAGNOSIS — D638 Anemia in other chronic diseases classified elsewhere: Secondary | ICD-10-CM | POA: Diagnosis present

## 2014-10-13 DIAGNOSIS — C787 Secondary malignant neoplasm of liver and intrahepatic bile duct: Secondary | ICD-10-CM | POA: Diagnosis present

## 2014-10-13 DIAGNOSIS — Z9049 Acquired absence of other specified parts of digestive tract: Secondary | ICD-10-CM | POA: Diagnosis present

## 2014-10-13 DIAGNOSIS — K7290 Hepatic failure, unspecified without coma: Secondary | ICD-10-CM | POA: Diagnosis present

## 2014-10-13 DIAGNOSIS — Z79891 Long term (current) use of opiate analgesic: Secondary | ICD-10-CM

## 2014-10-13 DIAGNOSIS — Z515 Encounter for palliative care: Secondary | ICD-10-CM | POA: Diagnosis not present

## 2014-10-13 DIAGNOSIS — I81 Portal vein thrombosis: Secondary | ICD-10-CM | POA: Diagnosis present

## 2014-10-13 DIAGNOSIS — E1165 Type 2 diabetes mellitus with hyperglycemia: Secondary | ICD-10-CM | POA: Diagnosis present

## 2014-10-13 DIAGNOSIS — C179 Malignant neoplasm of small intestine, unspecified: Secondary | ICD-10-CM

## 2014-10-13 DIAGNOSIS — T380X5A Adverse effect of glucocorticoids and synthetic analogues, initial encounter: Secondary | ICD-10-CM | POA: Diagnosis present

## 2014-10-13 DIAGNOSIS — Z923 Personal history of irradiation: Secondary | ICD-10-CM

## 2014-10-13 DIAGNOSIS — R1084 Generalized abdominal pain: Secondary | ICD-10-CM | POA: Diagnosis present

## 2014-10-13 DIAGNOSIS — E039 Hypothyroidism, unspecified: Secondary | ICD-10-CM | POA: Diagnosis present

## 2014-10-13 DIAGNOSIS — Z8546 Personal history of malignant neoplasm of prostate: Secondary | ICD-10-CM

## 2014-10-13 DIAGNOSIS — E876 Hypokalemia: Secondary | ICD-10-CM | POA: Diagnosis present

## 2014-10-13 DIAGNOSIS — Z7401 Bed confinement status: Secondary | ICD-10-CM | POA: Diagnosis not present

## 2014-10-13 DIAGNOSIS — T451X5A Adverse effect of antineoplastic and immunosuppressive drugs, initial encounter: Secondary | ICD-10-CM | POA: Diagnosis present

## 2014-10-13 DIAGNOSIS — M179 Osteoarthritis of knee, unspecified: Secondary | ICD-10-CM | POA: Diagnosis present

## 2014-10-13 DIAGNOSIS — Z87891 Personal history of nicotine dependence: Secondary | ICD-10-CM

## 2014-10-13 DIAGNOSIS — Z681 Body mass index (BMI) 19 or less, adult: Secondary | ICD-10-CM

## 2014-10-13 DIAGNOSIS — Z66 Do not resuscitate: Secondary | ICD-10-CM | POA: Diagnosis present

## 2014-10-13 DIAGNOSIS — R109 Unspecified abdominal pain: Secondary | ICD-10-CM | POA: Diagnosis present

## 2014-10-13 DIAGNOSIS — G9340 Encephalopathy, unspecified: Secondary | ICD-10-CM | POA: Diagnosis present

## 2014-10-13 DIAGNOSIS — Z808 Family history of malignant neoplasm of other organs or systems: Secondary | ICD-10-CM

## 2014-10-13 DIAGNOSIS — Z79899 Other long term (current) drug therapy: Secondary | ICD-10-CM

## 2014-10-13 DIAGNOSIS — Z800 Family history of malignant neoplasm of digestive organs: Secondary | ICD-10-CM | POA: Diagnosis not present

## 2014-10-13 DIAGNOSIS — C786 Secondary malignant neoplasm of retroperitoneum and peritoneum: Secondary | ICD-10-CM

## 2014-10-13 DIAGNOSIS — D72829 Elevated white blood cell count, unspecified: Secondary | ICD-10-CM | POA: Diagnosis present

## 2014-10-13 DIAGNOSIS — Z801 Family history of malignant neoplasm of trachea, bronchus and lung: Secondary | ICD-10-CM | POA: Diagnosis not present

## 2014-10-13 DIAGNOSIS — R627 Adult failure to thrive: Secondary | ICD-10-CM | POA: Diagnosis present

## 2014-10-13 DIAGNOSIS — I10 Essential (primary) hypertension: Secondary | ICD-10-CM | POA: Diagnosis present

## 2014-10-13 DIAGNOSIS — K219 Gastro-esophageal reflux disease without esophagitis: Secondary | ICD-10-CM | POA: Diagnosis present

## 2014-10-13 DIAGNOSIS — Z8249 Family history of ischemic heart disease and other diseases of the circulatory system: Secondary | ICD-10-CM | POA: Diagnosis not present

## 2014-10-13 DIAGNOSIS — E860 Dehydration: Secondary | ICD-10-CM | POA: Diagnosis present

## 2014-10-13 DIAGNOSIS — D709 Neutropenia, unspecified: Secondary | ICD-10-CM | POA: Diagnosis present

## 2014-10-13 DIAGNOSIS — Z85828 Personal history of other malignant neoplasm of skin: Secondary | ICD-10-CM

## 2014-10-13 DIAGNOSIS — R52 Pain, unspecified: Secondary | ICD-10-CM

## 2014-10-13 DIAGNOSIS — Z8371 Family history of colonic polyps: Secondary | ICD-10-CM

## 2014-10-13 DIAGNOSIS — R64 Cachexia: Secondary | ICD-10-CM | POA: Diagnosis present

## 2014-10-13 DIAGNOSIS — C779 Secondary and unspecified malignant neoplasm of lymph node, unspecified: Secondary | ICD-10-CM | POA: Diagnosis present

## 2014-10-13 DIAGNOSIS — R532 Functional quadriplegia: Secondary | ICD-10-CM | POA: Diagnosis present

## 2014-10-13 DIAGNOSIS — E46 Unspecified protein-calorie malnutrition: Secondary | ICD-10-CM

## 2014-10-13 DIAGNOSIS — R531 Weakness: Secondary | ICD-10-CM | POA: Insufficient documentation

## 2014-10-13 DIAGNOSIS — R197 Diarrhea, unspecified: Secondary | ICD-10-CM

## 2014-10-13 DIAGNOSIS — R110 Nausea: Secondary | ICD-10-CM

## 2014-10-13 DIAGNOSIS — N19 Unspecified kidney failure: Secondary | ICD-10-CM | POA: Diagnosis present

## 2014-10-13 DIAGNOSIS — R112 Nausea with vomiting, unspecified: Secondary | ICD-10-CM

## 2014-10-13 LAB — CBC WITH DIFFERENTIAL/PLATELET
BASOS PCT: 0 % (ref 0–1)
Basophils Absolute: 0 10*3/uL (ref 0.0–0.1)
EOS ABS: 0 10*3/uL (ref 0.0–0.7)
Eosinophils Relative: 0 % (ref 0–5)
HEMATOCRIT: 38 % — AB (ref 39.0–52.0)
HEMOGLOBIN: 12.1 g/dL — AB (ref 13.0–17.0)
LYMPHS ABS: 0.6 10*3/uL — AB (ref 0.7–4.0)
LYMPHS PCT: 3 % — AB (ref 12–46)
MCH: 28.3 pg (ref 26.0–34.0)
MCHC: 31.8 g/dL (ref 30.0–36.0)
MCV: 89 fL (ref 78.0–100.0)
Monocytes Absolute: 0.7 10*3/uL (ref 0.1–1.0)
Monocytes Relative: 4 % (ref 3–12)
NEUTROS ABS: 17.1 10*3/uL — AB (ref 1.7–7.7)
Neutrophils Relative %: 93 % — ABNORMAL HIGH (ref 43–77)
Platelets: 284 10*3/uL (ref 150–400)
RBC: 4.27 MIL/uL (ref 4.22–5.81)
RDW: 25.6 % — ABNORMAL HIGH (ref 11.5–15.5)
WBC: 18.4 10*3/uL — AB (ref 4.0–10.5)

## 2014-10-13 LAB — COMPREHENSIVE METABOLIC PANEL
ALT: 16 U/L (ref 0–53)
AST: 28 U/L (ref 0–37)
Albumin: 1.6 g/dL — ABNORMAL LOW (ref 3.5–5.2)
Alkaline Phosphatase: 518 U/L — ABNORMAL HIGH (ref 39–117)
Anion gap: 9 (ref 5–15)
BILIRUBIN TOTAL: 1.6 mg/dL — AB (ref 0.3–1.2)
BUN: 16 mg/dL (ref 6–23)
CO2: 20 mmol/L (ref 19–32)
Calcium: 7.5 mg/dL — ABNORMAL LOW (ref 8.4–10.5)
Chloride: 109 mmol/L (ref 96–112)
Creatinine, Ser: 0.75 mg/dL (ref 0.50–1.35)
GFR calc Af Amer: >90 mL/min (ref >90)
GFR calc non Af Amer: >90 mL/min (ref >90)
Glucose, Bld: 87 mg/dL (ref 70–99)
Potassium: 3.5 mmol/L (ref 3.5–5.1)
SODIUM: 138 mmol/L (ref 135–145)
Total Protein: 5.9 g/dL — ABNORMAL LOW (ref 6.0–8.3)

## 2014-10-13 LAB — LIPASE, BLOOD: Lipase: 14 U/L (ref 11–59)

## 2014-10-13 MED ORDER — SODIUM CHLORIDE 0.9 % IV BOLUS (SEPSIS)
500.0000 mL | Freq: Once | INTRAVENOUS | Status: AC
  Administered 2014-10-13: 500 mL via INTRAVENOUS

## 2014-10-13 MED ORDER — ONDANSETRON HCL 4 MG/2ML IJ SOLN: 4.0000 mg | Freq: Four times a day (QID) | INTRAMUSCULAR | Status: DC | PRN

## 2014-10-13 MED ORDER — ACETAMINOPHEN 325 MG PO TABS: 650.0000 mg | ORAL_TABLET | Freq: Four times a day (QID) | ORAL | Status: DC | PRN

## 2014-10-13 MED ORDER — PANTOPRAZOLE SODIUM 40 MG IV SOLR
40.0000 mg | Freq: Every day | INTRAVENOUS | Status: DC
  Administered 2014-10-13 – 2014-10-18 (×6): 40 mg via INTRAVENOUS
  Filled 2014-10-13 (×7): qty 40

## 2014-10-13 MED ORDER — ONDANSETRON HCL 4 MG PO TABS: 4.0000 mg | ORAL_TABLET | Freq: Four times a day (QID) | ORAL | Status: DC | PRN

## 2014-10-13 MED ORDER — ACETAMINOPHEN 650 MG RE SUPP: 650.0000 mg | Freq: Four times a day (QID) | RECTAL | Status: DC | PRN

## 2014-10-13 MED ORDER — ENOXAPARIN SODIUM 40 MG/0.4ML ~~LOC~~ SOLN
40.0000 mg | Freq: Every day | SUBCUTANEOUS | Status: DC
  Administered 2014-10-13 – 2014-10-19 (×7): 40 mg via SUBCUTANEOUS
  Filled 2014-10-13 (×8): qty 0.4

## 2014-10-13 MED ORDER — SODIUM CHLORIDE 0.9 % IV SOLN
INTRAVENOUS | Status: DC
  Administered 2014-10-14: 03:00:00 via INTRAVENOUS

## 2014-10-13 MED ORDER — LIDOCAINE-PRILOCAINE 2.5-2.5 % EX CREA: TOPICAL_CREAM | Freq: Once | CUTANEOUS | Status: DC

## 2014-10-13 MED ORDER — MORPHINE SULFATE 4 MG/ML IJ SOLN
4.0000 mg | Freq: Once | INTRAMUSCULAR | Status: AC
Start: 2014-10-13 — End: 2014-10-13
  Administered 2014-10-13: 4 mg via INTRAVENOUS
  Filled 2014-10-13: qty 1

## 2014-10-13 MED ORDER — IOHEXOL 300 MG/ML  SOLN
100.0000 mL | Freq: Once | INTRAMUSCULAR | Status: AC | PRN
  Administered 2014-10-13: 100 mL via INTRAVENOUS

## 2014-10-13 MED ORDER — IOHEXOL 300 MG/ML  SOLN
25.0000 mL | Freq: Once | INTRAMUSCULAR | Status: AC | PRN
  Administered 2014-10-13: 25 mL via ORAL

## 2014-10-13 MED ORDER — PANTOPRAZOLE SODIUM 40 MG IV SOLR
40.0000 mg | Freq: Once | INTRAVENOUS | Status: AC
  Administered 2014-10-13: 40 mg via INTRAVENOUS
  Filled 2014-10-13: qty 40

## 2014-10-13 MED ORDER — LEVOTHYROXINE SODIUM 100 MCG IV SOLR
25.0000 ug | Freq: Every day | INTRAVENOUS | Status: DC
  Administered 2014-10-14 – 2014-10-18 (×5): 25 ug via INTRAVENOUS
  Filled 2014-10-13 (×6): qty 5

## 2014-10-13 MED ORDER — FERROUS SULFATE 325 (65 FE) MG PO TABS
325.0000 mg | ORAL_TABLET | Freq: Every day | ORAL | Status: DC
  Administered 2014-10-14: 325 mg via ORAL
  Filled 2014-10-13 (×3): qty 1

## 2014-10-13 MED ORDER — OXYCODONE-ACETAMINOPHEN 5-325 MG PO TABS
1.0000 | ORAL_TABLET | Freq: Four times a day (QID) | ORAL | Status: DC | PRN
  Administered 2014-10-16: 1 via ORAL
  Filled 2014-10-13: qty 1

## 2014-10-13 MED ORDER — SODIUM CHLORIDE 0.9 % IV SOLN
INTRAVENOUS | Status: AC
  Administered 2014-10-13: 22:00:00 via INTRAVENOUS

## 2014-10-13 MED ORDER — ONDANSETRON HCL 8 MG PO TABS: 8.0000 mg | ORAL_TABLET | Freq: Three times a day (TID) | ORAL | Status: DC | PRN

## 2014-10-13 MED ORDER — HYDROMORPHONE HCL 1 MG/ML IJ SOLN
1.0000 mg | INTRAMUSCULAR | Status: DC | PRN
  Administered 2014-10-16 – 2014-10-17 (×4): 1 mg via INTRAVENOUS
  Filled 2014-10-13 (×4): qty 1

## 2014-10-13 MED ORDER — MEGESTROL ACETATE 40 MG/ML PO SUSP
200.0000 mg | Freq: Two times a day (BID) | ORAL | Status: DC
  Administered 2014-10-13 – 2014-10-16 (×7): 200 mg via ORAL
  Filled 2014-10-13 (×9): qty 5

## 2014-10-13 MED ORDER — ONDANSETRON HCL 4 MG/2ML IJ SOLN
4.0000 mg | Freq: Once | INTRAMUSCULAR | Status: AC
  Administered 2014-10-13: 4 mg via INTRAVENOUS
  Filled 2014-10-13: qty 2

## 2014-10-13 NOTE — ED Notes (Signed)
Pt, being sent by Oncologist, c/o abdominal pain and n/v x 2 days.  Pt is uncontrolled w/ home medications.  Hx of small bowel CA.  Last chemo on 2/9.  Denies fever.

## 2014-10-13 NOTE — ED Notes (Signed)
Patient is unable to give urine sample at this time and said it may be a while before he can go

## 2014-10-13 NOTE — Progress Notes (Signed)
[place] OFFICE PROGRESS NOTE   Diagnosis:  Small bowel carcinoma  INTERVAL HISTORY:   Mr. Godsey returns prior to scheduled follow-up for evaluation of abdominal pain. He completed cycle 2 Pembrolizumab on 10/04/2014. Over the past 2 days he has had increased upper abdominal pain. The pain is incompletely relieved with Percocet. He is taking Percocet around the clock. He is having "dry heaves". For the past 2 days he has had diarrhea. So far today he has had no bowel movements. He reports minimal oral intake. He has had a "Gatorade and a half" today. No solid food for the past 3 days. His wife notes that he is sleeping more. No fever. No shortness of breath. He notes both hands are somewhat swollen. No leg edema. No bleeding.  Objective:  Vital signs in last 24 hours:  Blood pressure 132/88, pulse 118, temperature 97.8 F (36.6 C), temperature source Oral, resp. rate 18, height 5' 11"  (1.803 m), weight 145 lb 9.6 oz (66.044 kg), SpO2 100 %.   Chronically ill-appearing man. Required 3 staff members to assist to maneuver onto the exam table. HEENT: No thrush or ulcerations. Mucous membranes are moist. Resp: Lungs clear bilaterally. Cardio: Heart is regular, tachycardic. GI: Abdomen is mildly distended with marked tenderness at the upper quadrants. Fullness at the right upper abdomen. No definite hepatomegaly. Bowel sounds hypoactive. Vascular: No leg edema. Neuro: Alert and oriented.  Skin: Skin is dry. Ecchymoses scattered over the forearms.    Lab Results:  Lab Results  Component Value Date   WBC 21.4* 10/04/2014   HGB 9.4* 10/04/2014   HCT 30.1* 10/04/2014   MCV 85.4 10/04/2014   PLT 511* 10/04/2014   NEUTROABS 19.2* 10/04/2014    Imaging:  No results found.  Medications: I have reviewed the patient's current medications.  Assessment/Plan: 1.Adenocarcinoma of the small bowel, stage III (T3 N1), status post a partial small bowel resection 10/14/2013    The tumor returned microsatellite instability high with loss of MSH2 expression   MRI of the liver 05/28/2014 with multiple hepatic metastases and portal/retroperitoneal lymphadenopathy, ultrasound-guided biopsy of the liver 05/31/2014 confirmed metastatic small bowel carcinoma   Cycle 1 FOLFOX 06/08/2014  Cycle 2 FOLFOX with Neulasta support 06/29/2014  Cycle 3 FOLFOX 07/13/2014  Cycle 4 FOLFOX 08/03/2014  Cycle 5 FOLFOX 08/22/2014  Restaging CT 09/02/2014 revealed progression of abdominal lymphadenopathy and peritoneal carcinomatosis  Cycle 1 Pembrolizumab 09/13/2014  Cycle 2 Pembrolizumab 10/04/2014 2. Colon cancer X3, 1997 and 1999, status post an intra-abdominal colectomy with ileorectal anastomosis in May 1999  3. History of Iron deficiency anemia  4. Diabetes  5. Hypothyroidism  6. Hypertension  7. Family history of colon cancer-he has hereditary non-polyposis colon cancer syndrome  8. 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 Ascites-likely secondary to portal hypertension versus carcinomatosis, repeat peritoneal fluid cytologies negative for tumor cells  10. 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 Right neck pain-most likely secondary to a benign musculoskeletal condition, CT of the neck 06/06/2014 with degenerative changes, no evidence of metastatic disease  12. Renal failure-likely related to carcinomatosis and hepatic metastases, he saw nephrology 06/07/2014, improved  13. Nausea and vomiting following cycle 1  FOLFOX 14. Neutropenia following cycle one FOLFOX, Neulasta added with cycle 2. He  declined further Neulasta. 15. 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 History of Hypokalemia-likely secondary to diarrhea 17. Pain-likely secondary to metastatic lymphadenopathy and carcinomatosis, or tumor flare following pembrolizumab   Disposition: Mr. Stockard has metastatic small bowel cancer. He has completed 2 cycles of Pembrolizumab. He presents to the office today with worsening abdominal pain, recent diarrhea, nausea. He appears dehydrated. I discussed his case with Dr. Marin Olp. He will need to be admitted for pain control/further evaluation of the pain. We will refer him to the emergency department with plans for hospitalist admission. I contacted the charge nurse at the [facility] Emergency Department and reviewed the above.  Dr. Benay Spice is out of the office. Dr. Marin Olp will follow him while in the hospital. 25 minutes were spent face-to-face at today's visit with the majority of that time involved in counseling/coordination of care.  [name] ANP/GNP-BC   10/13/2014  3:44 PM

## 2014-10-13 NOTE — H&P (Signed)
[facility] History and Physical  Dean Owens YYP:[id number] DOB: 1946/03/06 DOA: 10/13/2014  Referring physician: ER physician. PCP: Darnelle Bos, MD   Chief Complaint: Abdominal pain.  HPI: Dean Owens is a 69 y.o. male with known history of adenocarcinoma of the small bowel on chemotherapy was referred to the ER after patient was complaining of increasing abdominal pain. Patient has been having abdominal pain over the last 2-3 days which is generalized. Patient has been unable to eat because of the pain. Patient also has been having some nausea and diarrhea. In the ER patient had CT abdomen and pelvis which shows progression of the tumor. Patient has been admitted for further management. Patient denies any fever chills chest pain or shortness of breath. Denies using any recent antibiotics.   Review of Systems: As presented in the history of presenting illness, rest negative.  Past Medical History  Diagnosis Date  . Hypertension   . Type 2 diabetes mellitus   . Hypothyroidism   . History of colon cancer NO RECURRENCE    S/P COLECTOMY X3  LAST ONE 1998--  CHEMO COMLETE 1997  . ED (erectile dysfunction) of organic origin   . Right knee DJD   . Eczema   . OA (osteoarthritis)     LEFT KNEE  . Wears glasses   . History of iron deficiency anemia   . History of squamous cell carcinoma excision   . Accelerated hypertension 10/21/2013  . Type II diabetes mellitus 10/21/2013  . Unspecified hypothyroidism 10/21/2013  . 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 Lynch syndrome   . History of nonmelanoma skin cancer   . Anemia   . Recurrent prostate carcinoma FOLLOWED BY DR Patsi Sears    DX 2007  S/P RADIOACTIVE SEED IMPLANTS  . 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 Ileus     s/p op 3 days  . History of blood transfusion 09/2013    PRBC  . S/P radiation therapy 01/19/14-03/01/14    retroperitonela sarcoma/abdomen   Past Surgical History  Procedure  Laterality Date  . Givens capsule study N/A 07/05/2013    Procedure: GIVENS CAPSULE STUDY;  Surgeon: Charolett Bumpers, MD;  Location: [facility] ENDOSCOPY;  Service: Endoscopy;  Laterality: N/A;  . Radioactive prostate seed implants  OCT 2007  . Knee arthroscopy Bilateral 2006 &  2007  . Tonsillectomy  AS CHILD  . Appendectomy  AGE 49  . Cryoablation N/A 10/01/2013    Procedure: CRYO ABLATION PROSTATE;  Surgeon: Kathi Ludwig, MD;  Location: [facility];  Service: Urology;  Laterality: N/A;  . Prostate surgery    . Right colectomy  09/07/1995  . Sigmoidectomy  11/13/95  . Total colectomy  12/29/1997    ileorectal anastomosis  . Laparotomy N/A 10/14/2013    Procedure: EXPLORATORY LAPAROTOMY;  Surgeon: Atilano Ina, MD;  Location: [facility] OR;  Service: General;  Laterality: N/A;  . Bowel resection N/A 10/14/2013    Procedure: SMALL BOWEL RESECTION;  Surgeon: Atilano Ina, MD;  Location: [facility] OR;  Service: General;  Laterality: N/A;  . Lysis of adhesion N/A 10/14/2013    Procedure: LYSIS OF ADHESION;  Surgeon: Atilano Ina, MD;  Location: [facility] OR;  Service: General;  Laterality: N/A;  . Laparotomy N/A 12/14/2013    Procedure: EXPLORATORY LAPAROTOMY AND RESECTION OF RETROPERITONEAL SARCOMA;  Surgeon: Almond Lint, MD;  Location: [facility] ORS;  Service: General;  Laterality: N/A;  . Cystoscopy with retrograde  pyelogram, ureteroscopy and stent placement Right 12/14/2013    Procedure: CYSTOSCOPY WITH RIGHT RETROGRADE PYELOGRAM / INSERTION RIGHT URETERAL STENT;  Surgeon: [name], MD;  Location: [facility] ORS;  Service: Urology;  Laterality: Right;   Social History:  reports that he quit smoking about 13 months ago. His smoking use included Cigarettes. He has a 7.5 pack-year smoking history. He has never used smokeless tobacco. He reports that he drinks alcohol. He reports that he does not use illicit drugs. Where does patient live at home. Can patient participate in ADLs? Yes.  No Known  Allergies  Family History:  Family History  Problem Relation Age of Onset  . Heart failure Mother   . Heart failure Father   . Ovarian cancer Maternal Aunt   . Uterine cancer Paternal Aunt 71  . Colon cancer Paternal Aunt 87  . Cancer Paternal Aunt 45    sebaceous adenoma; Muir-Torre syndrome  . Colon cancer Paternal Grandmother   . Colon cancer Brother 60  . Colon cancer Cousin 24  . Uterine cancer Cousin 19  . Colon polyps Cousin   . Brain cancer Cousin 17    astrocytoma; Mary's daughter  . Colon polyps Cousin   . Lung cancer Cousin 89  . Breast cancer Cousin 68  . Uterine cancer Cousin 12  . Colon cancer Cousin 62      Prior to Admission medications   Medication Sig Start Date End Date Taking? Authorizing Provider  ferrous sulfate 325 (65 FE) MG tablet Take 325 mg by mouth daily with breakfast.  11/18/13  Yes Historical Provider, MD  lansoprazole (PREVACID) 15 MG capsule Take 15 mg by mouth daily.   Yes Historical Provider, MD  Multiple Vitamins-Minerals (ADULT GUMMY) CHEW Chew 2 capsules by mouth every morning.    Yes Historical Provider, MD  ondansetron (ZOFRAN) 8 MG tablet Take 1 tablet (8 mg total) by mouth every 8 (eight) hours as needed for nausea or vomiting. Begin 72 hours after chemo. 09/06/14  Yes Ladell Pier, MD  oxyCODONE-acetaminophen (PERCOCET/ROXICET) 5-325 MG per tablet Take 1-2 tablets by mouth every 6 (six) hours as needed for moderate pain or severe pain. 10/04/14  Yes Ladell Pier, MD  dexamethasone (DECADRON) 4 MG tablet Take 2 tablets (8 mg total) by mouth 2 (two) times daily with a meal. For nausea Patient not taking: Reported on 10/13/2014 06/09/14   Ladell Pier, MD  levothyroxine (SYNTHROID, LEVOTHROID) 50 MCG tablet Take 50 mcg by mouth daily before breakfast.    Historical Provider, MD  lidocaine-prilocaine (EMLA) cream Apply to port a cath site one hour prior to use. 09/20/14   Owens Shark, NP  megestrol (MEGACE) 40 MG/ML suspension Take 5  mLs (200 mg total) by mouth 2 (two) times daily. 09/20/14   Owens Shark, NP  potassium chloride SA (K-DUR,KLOR-CON) 20 MEQ tablet Take 1 tablet (20 mEq total) by mouth daily. Patient not taking: Reported on 10/13/2014 09/06/14   Ladell Pier, MD  prochlorperazine (COMPAZINE) 10 MG tablet Take 1 tablet (10 mg total) by mouth every 6 (six) hours as needed for nausea or vomiting. Patient not taking: Reported on 10/13/2014 06/09/14   Ladell Pier, MD    Physical Exam: Filed Vitals:   10/13/14 1640 10/13/14 1817 10/13/14 1938  BP: 120/89 144/92 144/73  Pulse: 115 95 78  Temp: 98 F (36.7 C)    TempSrc: Axillary    Resp: _0 SpO2: 100% 99% 99%  General:  Moderately built and poorly nourished.  Eyes: Anicteric no pallor.  ENT: No discharge from ears eyes nose or mouth.  Neck: No mass felt.  Cardiovascular: S1-S2 heard.  Respiratory: No rhonchi or crepitations.  Abdomen: Generalized tenderness soft no guarding or rigidity. Bowel sounds feeble.  Skin: No rash.  Musculoskeletal: No edema.  Psychiatric: Appears normal.  Neurologic: Alert and awake oriented to time place and person. Moves all extremities.  Labs on Admission:  Basic Metabolic Panel:  Recent Labs Lab 10/13/14 1804  NA 138  K 3.5  CL 109  CO2 20  GLUCOSE 87  BUN 16  CREATININE 0.75  CALCIUM 7.5*   Liver Function Tests:  Recent Labs Lab 10/13/14 1804  AST 28  ALT 16  ALKPHOS 518*  BILITOT 1.6*  PROT 5.9*  ALBUMIN 1.6*    Recent Labs Lab 10/13/14 1804  LIPASE 14   No results for input(s): AMMONIA in the last 168 hours. CBC:  Recent Labs Lab 10/13/14 1804  WBC 18.4*  NEUTROABS 17.1*  HGB 12.1*  HCT 38.0*  MCV 89.0  PLT 284   Cardiac Enzymes: No results for input(s): CKTOTAL, CKMB, CKMBINDEX, TROPONINI in the last 168 hours.  BNP (last 3 results) No results for input(s): BNP in the last 8760 hours.  ProBNP (last 3 results) No results for input(s): PROBNP in the  last 8760 hours.  CBG: No results for input(s): GLUCAP in the last 168 hours.  Radiological Exams on Admission: Ct Abdomen Pelvis W Contrast  10/13/2014   CLINICAL DATA:  Mid abdominal pain and diarrhea beginning 10/12/2014. History of metastatic colon cancer  EXAM: CT ABDOMEN AND PELVIS WITH CONTRAST  TECHNIQUE: Multidetector CT imaging of the abdomen and pelvis was performed using the standard protocol following bolus administration of intravenous contrast.  CONTRAST:  25 mL OMNIPAQUE IOHEXOL 300 MG/ML SOLN, 100 mL OMNIPAQUE IOHEXOL 300 MG/ML SOLN  COMPARISON:  CT abdomen and pelvis 09/02/2014 and 05/26/2014.  FINDINGS: The patient has a new very small right pleural effusion. No left pleural effusion is present and no pericardial effusion is identified.  As on the prior examination, multiple mass lesions are seen in the liver consistent with metastatic disease. Index lesion in the right hepatic lobe which had measured 12.2 x 8.7 cm today measures 12.7 x 7.6 cm on image 24. Difference in measurement is likely due to lack of contrast on the prior examination. The spleen, adrenal glands, pancreas and kidneys are unremarkable.  There is a moderate volume of abdominal and pelvic ascites which is increased somewhat since the prior study. Extensive abdominal lymphadenopathy is again seen. Omental and mesenteric nodularity is again identified.  Index porta hepatis noted which had measured 3.2 x 4.9 cm today measures 4.0 x 4.8 cm on image 31.  Left periaortic node which had measured 2.9 x 2.5 cm today measures 2.9 x 2.8 cm on image 40.  Mass in the central mesentery anterior to the aorta which had measured 3.4 x 1.9 cm today measures 4.2 x 2.8 cm on image 52.  The patient is status post colectomy and Hartmann's pouch formation. There is no evidence of bowel obstruction.  No lytic or sclerotic bony lesion is identified.  IMPRESSION: Progression of metastatic disease in the abdomen and pelvis with increased  lymphadenopathy identified. There has also been increase in abdominal and pelvic ascites which now appears moderate. Hepatic metastases are not notably change.  New small right pleural effusion.  Status post colectomy.   Electronically Signed  By: Inge Rise M.D.   On: 10/13/2014 19:32   Dg Chest Port 1 View  10/13/2014   CLINICAL DATA:  Per CC nurse-came in today with abdominal pain starting yesterday. Took 2 Percocet at 1330 today. Has had diarrhea, decreased PO intake, increasing abdominal pain for the past few days with increased fatigue. Receiving chemo infusions currently. Denies fevers, SOB. RR even/unlabored. Has had acid reflux at home. No other c/c. Hx colon cancer, diabetic, ex-smoker  EXAM: PORTABLE CHEST - 1 VIEW  COMPARISON:  06/20/2006  FINDINGS: Cardiac silhouette normal in size and configuration. No mediastinal or hilar masses or evidence of adenopathy. Right anterior chest wall power Port-A-Cath extends through the right internal jugular vein to have its tip in the lower superior vena cava.  Clear lungs.  No pleural effusion or pneumothorax.  Bony thorax is demineralized but grossly intact.  IMPRESSION: No acute cardiopulmonary disease.   Electronically Signed   By: Lajean Manes M.D.   On: 10/13/2014 20:13     Assessment/Plan Principal Problem:   Abdominal pain Active Problems:   Small bowel carcinoma   Protein-calorie malnutrition   Hypothyroidism   1. Abdominal pain secondary to progression of patient's known adenocarcinoma of the small bowel with ascites - at this time I have placed patient on clear liquid diet and gentle hydration and pain relief medications. Further recommendations per oncology. 2. Protein calorie malnutrition - patient has not been able to eat well secondary to #1. We will await oncology recommendations. 3. Hypothyroidism - on IV Synthroid until patient can take orally. 4. History of hypertension and diabetes presently off medications.   DVT  Prophylaxis Lovenox.  Code Status: Full code.  Family Communication: None.  Disposition Plan: Admit to inpatient.    Dean Philipp N. [facility] Pager [phone].  If 7PM-7AM, please contact night-coverage [email or web address] Password [facility] 10/13/2014, 10:12 PM

## 2014-10-13 NOTE — ED Notes (Signed)
Patient refuses me getting blood bc he gives it through a port.

## 2014-10-13 NOTE — ED Notes (Signed)
Per CC nurse-came in today with abdominal pain starting yesterday. Took 2 Percocet at 1330 today. Has had diarrhea, decreased PO intake, increasing abdominal pain for the past few days with increased fatigue. Receiving chemo infusions currently. Denies fevers, SOB. RR even/unlabored. Has had acid reflux at home. No other c/c.

## 2014-10-13 NOTE — ED Provider Notes (Signed)
CSN: [id number]     Arrival date & time 10/13/14  1634 History   First MD Initiated Contact with Patient 10/13/14 1639     Chief Complaint  Patient presents with  . Abdominal Pain  . cancer card      (Consider location/radiation/quality/duration/timing/severity/associated sxs/prior Treatment) HPI .[name]...level V caveat for urgent need for intervention.  Lower abdominal pain for 3 days. Patient has a past history of small bowel carcinoma and Lynch disease. He is currently on chemotherapy. He is been unable to eat.  C/o nausea, diarrhea, malaise, fatigue. Past Medical History  Diagnosis Date  . Hypertension   . Type 2 diabetes mellitus   . Hypothyroidism   . History of colon cancer NO RECURRENCE    S/P COLECTOMY X3  LAST ONE 1998--  [place]  . ED (erectile dysfunction) of organic origin   . Right knee DJD   . Eczema   . OA (osteoarthritis)     LEFT KNEE  . Wears glasses   . History of iron deficiency anemia   . History of squamous cell carcinoma excision   . Accelerated hypertension 10/21/2013  . Type II diabetes mellitus 10/21/2013  . Unspecified hypothyroidism 10/21/2013  . 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 Lynch syndrome   . History of nonmelanoma skin cancer   . Anemia   . Recurrent prostate carcinoma FOLLOWED BY DR Gaynelle Arabian    DX 2007  S/P RADIOACTIVE SEED IMPLANTS  . 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 Ileus     s/p op 3 days  . History of blood transfusion 09/2013    PRBC  . S/P radiation therapy 01/19/14-03/01/14    retroperitonela sarcoma/abdomen   Past Surgical History  Procedure Laterality Date  . Givens capsule study N/A 07/05/2013    Procedure: GIVENS CAPSULE STUDY;  Surgeon: [name], MD;  Location: [place];  Service: Endoscopy;  Laterality: N/A;  . Radioactive prostate seed implants  OCT 2007  . Knee arthroscopy Bilateral 2006 &  2007  . Tonsillectomy  AS CHILD  . Appendectomy  AGE 69  . Cryoablation N/A 10/01/2013   Procedure: CRYO ABLATION PROSTATE;  Surgeon: [name], MD;  Location[facility];  Service: Urology;  Laterality: N/A;  . Prostate surgery    . Right colectomy  09/07/1995  . Sigmoidectomy  11/13/95  . Total colectomy  12/29/1997    ileorectal anastomosis  . Laparotomy N/A 10/14/2013    Procedure: EXPLORATORY LAPAROTOMY;  Surgeon: [name], MD;  Location: [place];  Service: General;  Laterality: N/A;  . Bowel resection N/A 10/14/2013    Procedure: SMALL BOWEL RESECTION;  Surgeon: [name], MD;  Location: [place];  Service: General;  Laterality: N/A;  . Lysis of adhesion N/A 10/14/2013    Procedure: LYSIS OF ADHESION;  Surgeon: [name], MD;  Location: [place];  Service: General;  Laterality: N/A;  . 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 Cystoscopy with retrograde pyelogram, ureteroscopy and stent placement Right 12/14/2013    Procedure: CYSTOSCOPY WITH RIGHT RETROGRADE PYELOGRAM / [place];  Surgeon: [name], MD;  Location: [facility];  Service: Urology;  Laterality: Right;   Family History  Problem Relation Age of Onset  . Heart failure Mother   . Heart  failure Father   . Ovarian cancer Maternal Aunt   . Uterine cancer Paternal Aunt 71  . Colon cancer Paternal Aunt 87  . Cancer Paternal Aunt 56    sebaceous adenoma; Muir-Torre syndrome  . Colon cancer Paternal Grandmother   . Colon cancer Brother 66  . Colon cancer Cousin 60  . Uterine cancer Cousin 63  . Colon polyps Cousin   . Brain cancer Cousin 17    astrocytoma; Mary's daughter  . Colon polyps Cousin   . Lung cancer Cousin 41  . Breast cancer Cousin 28  . Uterine cancer Cousin 54  . Colon cancer Cousin 64   History  Substance Use Topics  . Smoking status: Former Smoker -- 0.50 packs/day for 15 years    Types:  Cigarettes    Quit date: 09/07/2013  . Smokeless tobacco: Never Used  . Alcohol Use: Yes     Comment: OCCASIONAL beer    Review of Systems  Unable to perform ROS: Acuity of condition      Allergies  Review of patient's allergies indicates no known allergies.  Home Medications   Prior to Admission medications   Medication Sig Start Date End Date Taking? Authorizing Provider  ferrous sulfate 325 (65 FE) MG tablet Take 325 mg by mouth daily with breakfast.  11/18/13  Yes Historical Provider, MD  lansoprazole (PREVACID) 15 MG capsule Take 15 mg by mouth daily.   Yes Historical Provider, MD  Multiple Vitamins-Minerals (ADULT GUMMY) CHEW Chew 2 capsules by mouth every morning.    Yes Historical Provider, MD  ondansetron (ZOFRAN) 8 MG tablet Take 1 tablet (8 mg total) by mouth every 8 (eight) hours as needed for nausea or vomiting. Begin 72 hours after chemo. 09/06/14  Yes Ladell Pier, MD  oxyCODONE-acetaminophen (PERCOCET/ROXICET) 5-325 MG per tablet Take 1-2 tablets by mouth every 6 (six) hours as needed for moderate pain or severe pain. 10/04/14  Yes Ladell Pier, MD  dexamethasone (DECADRON) 4 MG tablet Take 2 tablets (8 mg total) by mouth 2 (two) times daily with a meal. For nausea Patient not taking: Reported on 10/13/2014 06/09/14   Ladell Pier, MD  levothyroxine (SYNTHROID, LEVOTHROID) 50 MCG tablet Take 50 mcg by mouth daily before breakfast.    Historical Provider, MD  lidocaine-prilocaine (EMLA) cream Apply to port a cath site one hour prior to use. 09/20/14   Owens Shark, NP  megestrol (MEGACE) 40 MG/ML suspension Take 5 mLs (200 mg total) by mouth 2 (two) times daily. 09/20/14   Owens Shark, NP  potassium chloride SA (K-DUR,KLOR-CON) 20 MEQ tablet Take 1 tablet (20 mEq total) by mouth daily. Patient not taking: Reported on 10/13/2014 09/06/14   Ladell Pier, MD  prochlorperazine (COMPAZINE) 10 MG tablet Take 1 tablet (10 mg total) by mouth every 6 (six) hours as needed  for nausea or vomiting. Patient not taking: Reported on 10/13/2014 06/09/14   Ladell Pier, MD   BP 144/73 mmHg  Pulse 78  Temp(Src) 98 F (36.7 C) (Axillary)  Resp 18  SpO2 99% Physical Exam  Constitutional: He is oriented to person, place, and time.  Pale, alert  HENT:  Head: Normocephalic and atraumatic.  Eyes: Conjunctivae and EOM are normal. Pupils are equal, round, and reactive to light.  Neck: Normal range of motion. Neck supple.  Cardiovascular: Normal rate and regular rhythm.   Pulmonary/Chest: Effort normal and breath sounds normal.  Abdominal:  Extensive vertical abdominal scar. Tender in right  lower quadrant and left lower quadrant.  Musculoskeletal: Normal range of motion.  Neurological: He is alert and oriented to person, place, and time.  Skin: Skin is warm and dry.  Psychiatric: He has a normal mood and affect. His behavior is normal.  Nursing note and vitals reviewed.   ED Course  Procedures (including critical care time) Labs Review Labs Reviewed  COMPREHENSIVE METABOLIC PANEL - Abnormal; Notable for the following:    Calcium 7.5 (*)    Total Protein 5.9 (*)    Albumin 1.6 (*)    Alkaline Phosphatase 518 (*)    Total Bilirubin 1.6 (*)    All other components within normal limits  CBC WITH DIFFERENTIAL/PLATELET - Abnormal; Notable for the following:    WBC 18.4 (*)    Hemoglobin 12.1 (*)    HCT 38.0 (*)    RDW 25.6 (*)    Neutrophils Relative % 93 (*)    Lymphocytes Relative 3 (*)    Neutro Abs 17.1 (*)    Lymphs Abs 0.6 (*)    All other components within normal limits  LIPASE, BLOOD  URINALYSIS, ROUTINE W REFLEX MICROSCOPIC    Imaging Review Ct Abdomen Pelvis W Contrast  10/13/2014   CLINICAL DATA:  Mid abdominal pain and diarrhea beginning 10/12/2014. History of metastatic colon cancer  EXAM: CT ABDOMEN AND PELVIS WITH CONTRAST  TECHNIQUE: Multidetector CT imaging of the abdomen and pelvis was performed using the standard protocol following  bolus administration of intravenous contrast.  CONTRAST:  25 mL OMNIPAQUE IOHEXOL 300 MG/ML SOLN, 100 mL OMNIPAQUE IOHEXOL 300 MG/ML SOLN  COMPARISON:  CT abdomen and pelvis 09/02/2014 and 05/26/2014.  FINDINGS: The patient has a new very small right pleural effusion. No left pleural effusion is present and no pericardial effusion is identified.  As on the prior examination, multiple mass lesions are seen in the liver consistent with metastatic disease. Index lesion in the right hepatic lobe which had measured 12.2 x 8.7 cm today measures 12.7 x 7.6 cm on image 24. Difference in measurement is likely due to lack of contrast on the prior examination. The spleen, adrenal glands, pancreas and kidneys are unremarkable.  There is a moderate volume of abdominal and pelvic ascites which is increased somewhat since the prior study. Extensive abdominal lymphadenopathy is again seen. Omental and mesenteric nodularity is again identified.  Index porta hepatis noted which had measured 3.2 x 4.9 cm today measures 4.0 x 4.8 cm on image 31.  Left periaortic node which had measured 2.9 x 2.5 cm today measures 2.9 x 2.8 cm on image 40.  Mass in the central mesentery anterior to the aorta which had measured 3.4 x 1.9 cm today measures 4.2 x 2.8 cm on image 52.  The patient is status post colectomy and Hartmann's pouch formation. There is no evidence of bowel obstruction.  No lytic or sclerotic bony lesion is identified.  IMPRESSION: Progression of metastatic disease in the abdomen and pelvis with increased lymphadenopathy identified. There has also been increase in abdominal and pelvic ascites which now appears moderate. Hepatic metastases are not notably change.  New small right pleural effusion.  Status post colectomy.   Electronically Signed   By: Inge Rise M.D.   On: 10/13/2014 19:32     EKG Interpretation None      MDM   Final diagnoses:  Abdominal pain  Weakness    CT scan reveals a progression of his  metastatic disease in the abdomen and pelvis with  increased adenopathy. Also noted is an increase in his abdominal and pelvic ascites.  IV fluids, pain management, admit to general medicine    Nat Christen, MD 10/13/14 2023

## 2014-10-13 NOTE — Telephone Encounter (Signed)
m, °

## 2014-10-13 NOTE — Telephone Encounter (Signed)
Contacted pt wife this am to check on pt status, she reports that pt had a good night and is still sleeping this am.  Pt took 2 Percocet @ 10pm last night; states "he is not moaning or seem to be in any pain right now"   offered wife 11am appt with [facility] or 2:45 appt with [name], NP for evaluation request by wife.  Pt's wife states that she does not think its "urgent" for this am and confirmed they would be her at 2:45 to see [name], NP.  Scheduling/Lisa Thomas notified.

## 2014-10-14 DIAGNOSIS — D72829 Elevated white blood cell count, unspecified: Secondary | ICD-10-CM

## 2014-10-14 DIAGNOSIS — R110 Nausea: Secondary | ICD-10-CM

## 2014-10-14 DIAGNOSIS — R1010 Upper abdominal pain, unspecified: Secondary | ICD-10-CM

## 2014-10-14 LAB — URINALYSIS, ROUTINE W REFLEX MICROSCOPIC
GLUCOSE, UA: NEGATIVE mg/dL
HGB URINE DIPSTICK: NEGATIVE
Ketones, ur: NEGATIVE mg/dL
Leukocytes, UA: NEGATIVE
Nitrite: NEGATIVE
Protein, ur: NEGATIVE mg/dL
Urobilinogen, UA: 1 mg/dL (ref 0.0–1.0)
pH: 6 (ref 5.0–8.0)

## 2014-10-14 LAB — COMPREHENSIVE METABOLIC PANEL
ALK PHOS: 479 U/L — AB (ref 39–117)
ALT: 15 U/L (ref 0–53)
AST: 22 U/L (ref 0–37)
Albumin: 1.5 g/dL — ABNORMAL LOW (ref 3.5–5.2)
Anion gap: 7 (ref 5–15)
BILIRUBIN TOTAL: 1.4 mg/dL — AB (ref 0.3–1.2)
BUN: 17 mg/dL (ref 6–23)
CO2: 22 mmol/L (ref 19–32)
CREATININE: 0.78 mg/dL (ref 0.50–1.35)
Calcium: 7.5 mg/dL — ABNORMAL LOW (ref 8.4–10.5)
Chloride: 109 mmol/L (ref 96–112)
GFR calc Af Amer: >90 mL/min (ref >90)
Glucose, Bld: 80 mg/dL (ref 70–99)
POTASSIUM: 3.7 mmol/L (ref 3.5–5.1)
Sodium: 138 mmol/L (ref 135–145)
Total Protein: 5.7 g/dL — ABNORMAL LOW (ref 6.0–8.3)

## 2014-10-14 LAB — PREALBUMIN: Prealbumin: 1.7 mg/dL — ABNORMAL LOW (ref 17.0–34.0)

## 2014-10-14 LAB — CBC WITH DIFFERENTIAL/PLATELET
Basophils Absolute: 0 10*3/uL (ref 0.0–0.1)
Basophils Relative: 0 % (ref 0–1)
EOS ABS: 0 10*3/uL (ref 0.0–0.7)
Eosinophils Relative: 0 % (ref 0–5)
HEMATOCRIT: 27.7 % — AB (ref 39.0–52.0)
HEMOGLOBIN: 8.7 g/dL — AB (ref 13.0–17.0)
LYMPHS ABS: 1.2 10*3/uL (ref 0.7–4.0)
Lymphocytes Relative: 6 % — ABNORMAL LOW (ref 12–46)
MCH: 28.3 pg (ref 26.0–34.0)
MCHC: 31.4 g/dL (ref 30.0–36.0)
MCV: 90.2 fL (ref 78.0–100.0)
Monocytes Absolute: 1.2 10*3/uL — ABNORMAL HIGH (ref 0.1–1.0)
Monocytes Relative: 6 % (ref 3–12)
NEUTROS PCT: 88 % — AB (ref 43–77)
Neutro Abs: 18 10*3/uL — ABNORMAL HIGH (ref 1.7–7.7)
Platelets: 336 10*3/uL (ref 150–400)
RBC: 3.07 MIL/uL — AB (ref 4.22–5.81)
RDW: 25.9 % — ABNORMAL HIGH (ref 11.5–15.5)
WBC: 20.4 10*3/uL — AB (ref 4.0–10.5)

## 2014-10-14 LAB — GLUCOSE, CAPILLARY
GLUCOSE-CAPILLARY: 210 mg/dL — AB (ref 70–99)
Glucose-Capillary: 74 mg/dL (ref 70–99)
Glucose-Capillary: 99 mg/dL (ref 70–99)

## 2014-10-14 LAB — MAGNESIUM: Magnesium: 1.7 mg/dL (ref 1.5–2.5)

## 2014-10-14 LAB — PHOSPHORUS: Phosphorus: 3.7 mg/dL (ref 2.3–4.6)

## 2014-10-14 LAB — CLOSTRIDIUM DIFFICILE BY PCR: Toxigenic C. Difficile by PCR: NEGATIVE

## 2014-10-14 MED ORDER — TRACE MINERALS CR-CU-F-FE-I-MN-MO-SE-ZN IV SOLN
INTRAVENOUS | Status: AC
  Administered 2014-10-14: 18:00:00 via INTRAVENOUS
  Filled 2014-10-14: qty 960

## 2014-10-14 MED ORDER — FAT EMULSION 20 % IV EMUL
240.0000 mL | INTRAVENOUS | Status: AC
  Administered 2014-10-14: 240 mL via INTRAVENOUS
  Filled 2014-10-14: qty 250

## 2014-10-14 MED ORDER — INSULIN ASPART 100 UNIT/ML ~~LOC~~ SOLN
0.0000 [IU] | Freq: Four times a day (QID) | SUBCUTANEOUS | Status: DC
Start: 2014-10-14 — End: 2014-10-15
  Administered 2014-10-14: 3 [IU] via SUBCUTANEOUS
  Administered 2014-10-15: 5 [IU] via SUBCUTANEOUS

## 2014-10-14 MED ORDER — METHYLPREDNISOLONE SODIUM SUCC 125 MG IJ SOLR
80.0000 mg | Freq: Three times a day (TID) | INTRAMUSCULAR | Status: DC
  Administered 2014-10-14 – 2014-10-16 (×9): 80 mg via INTRAVENOUS
  Filled 2014-10-14 (×12): qty 1.28

## 2014-10-14 MED ORDER — SODIUM CHLORIDE 0.9 % IV SOLN
INTRAVENOUS | Status: DC
  Administered 2014-10-14: 18:00:00 via INTRAVENOUS

## 2014-10-14 MED ORDER — SODIUM CHLORIDE 0.9 % IV SOLN: INTRAVENOUS | Status: DC

## 2014-10-14 NOTE — Progress Notes (Signed)
CARE MANAGEMENT NOTE 10/14/2014  Patient:  Dean Owens, Dean Owens   Account Number:  [id number]  Date Initiated:  10/14/2014  Documentation initiated by:  DAVIS,RHONDA  Subjective/Objective Assessment:   current chemo patient nause, vomiting, 40 lbs weight loss in 58months, poor po intake, ctscan shows new ascites.Pain not controlled by home meds.     Action/Plan:   home when stable   Anticipated DC Date:  10/17/2014   Anticipated DC Plan:  HOME/SELF CARE  In-house referral  NA      DC Planning Services  CM consult      PAC Choice  NA   Choice offered to / List presented to:  NA      DME agency  NA        [place] agency  NA   Status of service:  In process, will continue to follow Medicare Important Message given?   (If response is "NO", the following Medicare IM given date fields will be blank) Date Medicare IM given:   Medicare IM given by:   Date Additional Medicare IM given:   Additional Medicare IM given by:    Discharge Disposition:    Per UR Regulation:  Reviewed for med. necessity/level of care/duration of stay  If discussed at [place] of Stay Meetings, dates discussed:    Comments:  Feb. 19 2016/Rhonda L. Rosana Hoes, RN, BSN, CCM. Case Management [place] [phone] No discharge needs present of time of review.

## 2014-10-14 NOTE — Progress Notes (Signed)
INITIAL NUTRITION ASSESSMENT  DOCUMENTATION CODES Per approved criteria  -Severe malnutrition in the context of chronic illness   Pt meets criteria for severe MALNUTRITION in the context of chronic illness as evidenced by 16% wt loss in past 6 months and moderate to severe depletion of fat and muscle mass.  INTERVENTION: -TPN per pharmacy  NUTRITION DIAGNOSIS: Inadquate oral intake related to increased nutrient needs as evidenced by estimated needs.   Goal: -Pt to meet >/= 90% of estimated needs  Monitor:  -Pt tolerance to TPN, weight trends, labs  Reason for Assessment: Consult per MD/TPN  69 y.o. male  Admitting Dx: Abdominal pain  ASSESSMENT: Pt with metastatic small bowel cancer, completed 2 cycles of chemo.  Worsening abdominal pain for past 2 days as well as diarrhea and nausea.   Pt states usual body weight is 220 lbs. Pt has 16% wt loss in past 6 months (significant for time frame).  Pt wife reports he has not eaten anything in 10 days.   Plan per Pharmacy: Start Clinimix E 5/15 at 40 ml/hr. Goal rate of 83 ml/hr + 20% fat emulsion at 10 ml/hr to provide: 100 g/day protein, 1912 Kcal/day.  Nutrition Focused Physical Exam:  Subcutaneous Fat:  Orbital Region: moderate depletion Upper Arm Region: moderate depletion Thoracic and Lumbar Region: n/a  Muscle:  Temple Region: moderate depletion Clavicle Bone Region: severe depletion Clavicle and Acromion Bone Region: severe depletion Scapular Bone Region: severe depletion Dorsal Hand: n/a Patellar Region: well nourished  Anterior Thigh Region: well nourished Posterior Calf Region: well nourished  Edema: not present    Height: Ht Readings from Last 1 Encounters:  10/14/14 5' 11"  (1.803 m)    Weight: Wt Readings from Last 1 Encounters:  10/14/14 145 lb 14.4 oz (66.18 kg)    Ideal Body Weight: 172 lbs  % Ideal Body Weight: 84%   Wt Readings from Last 10 Encounters:  10/14/14 145 lb 14.4 oz (66.18 kg)   10/13/14 145 lb 9.6 oz (66.044 kg)  10/04/14 150 lb 4.8 oz (68.176 kg)  09/29/14 145 lb (65.772 kg)  09/20/14 154 lb (69.854 kg)  09/06/14 156 lb 14.4 oz (71.169 kg)  08/22/14 176 lb 4.8 oz (79.969 kg)  07/27/14 178 lb 3.2 oz (80.831 kg)  07/12/14 177 lb 1.6 oz (80.332 kg)  06/22/14 172 lb 12.8 oz (78.382 kg)    Usual Body Weight: 220 lbs  % Usual Body Weight: 65%  BMI:  Body mass index is 20.36 kg/(m^2).  Estimated Nutritional Needs: Kcal: 2000-2200 kcals Protein: 95-110 g protein Fluid: >/= 2 L/day  Skin: Ecchymosis on both arms  Diet Order: Diet clear liquid  EDUCATION NEEDS: -No education needs identified at this time   Intake/Output Summary (Last 24 hours) at 10/14/14 0950 Last data filed at 10/14/14 0600  Gross per 24 hour  Intake    970 ml  Output    150 ml  Net    820 ml    Last BM: 2/19   Labs:   Recent Labs Lab 10/13/14 1804 10/14/14 0512  NA 138 138  K 3.5 3.7  CL 109 109  CO2 20 22  BUN 16 17  CREATININE 0.75 0.78  CALCIUM 7.5* 7.5*  GLUCOSE 87 80    CBG (last 3)  No results for input(s): GLUCAP in the last 72 hours.  Scheduled Meds: . enoxaparin (LOVENOX) injection  40 mg Subcutaneous QHS  . ferrous sulfate  325 mg Oral Q breakfast  . levothyroxine  25 mcg Intravenous Daily  . lidocaine-prilocaine   Topical Once  . megestrol  200 mg Oral BID  . methylPREDNISolone (SOLU-MEDROL) injection  80 mg Intravenous TID  . pantoprazole (PROTONIX) IV  40 mg Intravenous QHS    Continuous Infusions: . sodium chloride 100 mL/hr at 10/14/14 1324    Past Medical History  Diagnosis Date  . Hypertension   . Type 2 diabetes mellitus   . Hypothyroidism   . History of colon cancer NO RECURRENCE    S/P COLECTOMY X3  LAST ONE 1998--  [place]  . ED (erectile dysfunction) of organic origin   . Right knee DJD   . Eczema   . OA (osteoarthritis)     LEFT KNEE  . Wears glasses   . History of iron deficiency anemia   . History of  squamous cell carcinoma excision   . Accelerated hypertension 10/21/2013  . Type II diabetes mellitus 10/21/2013  . Unspecified hypothyroidism 10/21/2013  . 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 Lynch syndrome   . History of nonmelanoma skin cancer   . Anemia   . Recurrent prostate carcinoma FOLLOWED BY DR Gaynelle Arabian    DX 2007  S/P RADIOACTIVE SEED IMPLANTS  . 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 Ileus     s/p op 3 days  . History of blood transfusion 09/2013    PRBC  . S/P radiation therapy 01/19/14-03/01/14    retroperitonela sarcoma/abdomen    Past Surgical History  Procedure Laterality Date  . Givens capsule study N/A 07/05/2013    Procedure: GIVENS CAPSULE STUDY;  Surgeon: [name], MD;  Location: [place];  Service: Endoscopy;  Laterality: N/A;  . Radioactive prostate seed implants  OCT 2007  . Knee arthroscopy Bilateral 2006 &  2007  . Tonsillectomy  AS CHILD  . Appendectomy  AGE 43  . Cryoablation N/A 10/01/2013    Procedure: CRYO ABLATION PROSTATE;  Surgeon: [name], MD;  Location: [facility];  Service: Urology;  Laterality: N/A;  . Prostate surgery    . Right colectomy  09/07/1995  . Sigmoidectomy  11/13/95  . Total colectomy  12/29/1997    ileorectal anastomosis  . Laparotomy N/A 10/14/2013    Procedure: EXPLORATORY LAPAROTOMY;  Surgeon: [name], MD;  Location: [place];  Service: General;  Laterality: N/A;  . Bowel resection N/A 10/14/2013    Procedure: SMALL BOWEL RESECTION;  Surgeon: [name], MD;  Location: [place];  Service: General;  Laterality: N/A;  . Lysis of adhesion N/A 10/14/2013    Procedure: LYSIS OF ADHESION;  Surgeon: [name], MD;  Location: [place];  Service: General;  Laterality: N/A;  . 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 Cystoscopy with  retrograde pyelogram, ureteroscopy and stent placement Right 12/14/2013    Procedure: CYSTOSCOPY WITH RIGHT RETROGRADE PYELOGRAM / [place];  Surgeon: [name], MD;  Location[facility];  Service: Urology;  Laterality: Right;    Elmer Picker MS Dietetic Intern Pager Number [phone]

## 2014-10-14 NOTE — Progress Notes (Signed)
PARENTERAL NUTRITION CONSULT NOTE - INITIAL  Pharmacy Consult for TPN Indication: SB Ca, abd pain  No Known Allergies  Patient Measurements: Height: 5' 11"  (180.3 cm) Weight: 145 lb 14.4 oz (66.18 kg) IBW/kg (Calculated) : 75.3  Vital Signs: Temp: 98 F (36.7 C) (02/19 0512) Temp Source: Oral (02/19 0512) BP: 134/80 mmHg (02/19 0512) Pulse Rate: 81 (02/19 0512) Intake/Output from previous day: 02/18 0701 - 02/19 0700 In: 970 [P.O.:150; I.V.:820] Out: 150 [Stool:150] Intake/Output from this shift:    Labs:  Recent Labs  10/13/14 1804 10/14/14 0512  WBC 18.4* 20.4*  HGB 12.1* 8.7*  HCT 38.0* 27.7*  PLT 284 336    Recent Labs  10/13/14 1804 10/14/14 0512  NA 138 138  K 3.5 3.7  CL 109 109  CO2 20 22  GLUCOSE 87 80  BUN 16 17  CREATININE 0.75 0.78  CALCIUM 7.5* 7.5*  PROT 5.9* 5.7*  ALBUMIN 1.6* 1.5*  AST 28 22  ALT 16 15  ALKPHOS 518* 479*  BILITOT 1.6* 1.4*  Corrected Ca: 9.1 Estimated Creatinine Clearance: 82.8 mL/min (by C-G formula based on Cr of 0.78).   No results for input(s): GLUCAP in the last 72 hours.  Medical History: Past Medical History  Diagnosis Date  . Hypertension   . Type 2 diabetes mellitus   . Hypothyroidism   . History of colon cancer NO RECURRENCE    S/P COLECTOMY X3  LAST ONE 1998--  [place]  . ED (erectile dysfunction) of organic origin   . Right knee DJD   . Eczema   . OA (osteoarthritis)     LEFT KNEE  . Wears glasses   . History of iron deficiency anemia   . History of squamous cell carcinoma excision   . Accelerated hypertension 10/21/2013  . Type II diabetes mellitus 10/21/2013  . Unspecified hypothyroidism 10/21/2013  . 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 Lynch syndrome   . History of nonmelanoma skin cancer   . Anemia   . Recurrent prostate carcinoma FOLLOWED BY DR Gaynelle Arabian    DX 2007  S/P RADIOACTIVE SEED IMPLANTS  . 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 Ileus     s/p op 3  days  . History of blood transfusion 09/2013    PRBC  . S/P radiation therapy 01/19/14-03/01/14    retroperitonela sarcoma/abdomen   Insulin Requirements: none ordered  Current Nutrition: CL diet  IVF: NS at 100 ml/hr  Central access: has PAC, needs peripheral access TPN start date: 2/19  ASSESSMENT                                                                                                          HPI: complaint of 3 day hx of abd pain, nausea and diarrhea. Hx of SB carcinoma treated with Folfox x5 cycles, now Pembrolizumab, Cycle 2 received 2/9. Previous Colon Ca treated 1998, Prostate Ca, now SB adenocarcinoma, small bowel resection 10/14/13, with progression, liver mets.  Hx of DM, no DM meds PTA,  on SoluMedrol 32m IV q8hr  Significant events:   Today:    Glucose - plasma glucose   Electrolytes - wnl  Renal - wnl  LFTs - Bili 1.4, Alk PO4 elevated (liver mets)  TGs - pending  Prealbumin - pending  NUTRITIONAL GOALS                                                                                             RD recs: await goals Clinimix E 5/15 at a goal rate of 83 ml/hr + 20% fat emulsion at 10 ml/hr to provide: 100 g/day protein, 1912 Kcal/day.  PLAN                                                                                                                         At 1800 today:  Start Clinimix E 5/15 at 40 ml/hr.  20% fat emulsion at 10 ml/hr.  Plan to advance as tolerated to the goal rate.  TPN to contain standard multivitamins and trace elements.  Reduce IVF to 60 ml/hr.  Add SSI : Novolog sensitive scale q6hr  Magnesium and Phosphorus this am, replete if needed  TPN lab panels on Mondays & Thursdays.  F/u daily, assess for re-feeding syndrome  Full labs in am  GMinda DittoPharmD Pager [phone]/19/2016, 10:21 AM

## 2014-10-14 NOTE — Progress Notes (Signed)
Dean Owens   DOB:03/01/1946   IW#:[id number]   SNK#:[id number]  Patient Care Team: Horton Finer, MD as PCP - General (Internal Medicine) Ladell Pier, MD as Consulting Physician (Oncology) Carola Frost, RN as Registered Nurse  Subjective: Dean Owens is a 69 year old man with a history of metastatic small bowel cancer, completing 2 cycles Pembrolizumab, last on 10/14/2014, Admitted on 10/13/2014 after presenting to the office of the [place] with worsening abdominal pain not alleviated with pain medications, 2-day history of continuous diarrhea and nausea with intermittent dry heaves. The patient was dehydrated.He had minimal oral intake, having lost about 45 pounds in the last 2 months. He denies any fever or chills. He denied any shortness of breath or chest pain. He denies any peripheral edema.He denies any dysuria or hematuria. He denies any bleeding issues. No confusion was reported. He feels very fatigued. CT of the abdomen and pelvis with contrast on 10/13/2014 was notable for progression of metastatic disease in the abdomen and pelvis, with increased lymphadenopathy. It has also been an increase in the abdominal and pelvic ascites, now motivated. His hepatic metastases are not noticeably changed. There is a new small right pleural effusion. On admission, the patient received IV hydration, IV pain relief. He has also been placed on enteric precautions due to diarrhea. We were kindly informed of the patient's admission, to help in his management while hospitalized  Brief Oncological History: 1.Adenocarcinoma of the small bowel, stage III (T3 N1), status post a partial small bowel resection 10/14/2013   The tumor returned microsatellite instability high with loss of MSH2 expression   MRI of the liver 05/28/2014 with multiple hepatic metastases and portal/retroperitoneal lymphadenopathy, ultrasound-guided biopsy of the liver 05/31/2014 confirmed metastatic small bowel  carcinoma   Cycle 1 FOLFOX 06/08/2014  Cycle 2 FOLFOX with Neulasta support 06/29/2014  Cycle 3 FOLFOX 07/13/2014  Cycle 4 FOLFOX 08/03/2014  Cycle 5 FOLFOX 08/22/2014  Restaging CT 09/02/2014 revealed progression of abdominal lymphadenopathy and peritoneal carcinomatosis  Cycle 1 Pembrolizumab 09/13/2014  Cycle 2 Pembrolizumab 10/04/2014 2. Colon cancer X3, 1997 and 1999, status post an intra-abdominal colectomy with ileorectal anastomosis in May 1999  3. History of Iron deficiency anemia  4. Diabetes  5. Hypothyroidism  6. Hypertension  7. Family history of colon cancer-he has hereditary non-polyposis colon cancer syndrome  8. 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 Ascites-likely secondary to portal hypertension versus carcinomatosis, repeat peritoneal fluid cytologies negative for tumor cells  10. 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 Right neck pain-most likely secondary to a benign musculoskeletal condition, CT of the neck 06/06/2014 with degenerative changes, no evidence of metastatic disease  12. Renal failure-likely related to carcinomatosis and hepatic metastases, he saw nephrology 06/07/2014, improved  13. Nausea and vomiting following cycle 1 FOLFOX 14. Neutropenia following cycle one FOLFOX, Neulasta added with cycle 2. He declined further Neulasta. 15. 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 History of Hypokalemia-likely secondary to diarrhea 17. Pain-likely secondary to metastatic lymphadenopathy and carcinomatosis, or tumor flare following pembrolizumab  Scheduled Meds: .  enoxaparin (LOVENOX) injection  40 mg Subcutaneous QHS  . ferrous sulfate  325 mg Oral Q breakfast  .  levothyroxine  25 mcg Intravenous Daily  . lidocaine-prilocaine   Topical Once  . megestrol  200 mg Oral BID  . methylPREDNISolone (SOLU-MEDROL) injection  80 mg Intravenous TID  . pantoprazole (PROTONIX) IV  40 mg Intravenous QHS   Continuous Infusions: . sodium chloride 100 mL/hr at 10/14/14 0239   PRN Meds:acetaminophen **OR** acetaminophen, HYDROmorphone (DILAUDID) injection, ondansetron **OR** ondansetron (ZOFRAN) IV, oxyCODONE-acetaminophen   Objective:  Filed Vitals:   10/14/14 0512  BP: 134/80  Pulse: 81  Temp: 98 F (36.7 C)  Resp: 18      Intake/Output Summary (Last 24 hours) at 10/14/14 0810 Last data filed at 10/14/14 0600  Gross per 24 hour  Intake    970 ml  Output    150 ml  Net    820 ml    ECOG PERFORMANCE STATUS:3-4  GENERAL:alert, no distress and comfortable.Chronically ill appearing SKIN: skin color, texture, turgor are dry, no rashes or significant lesions.He has scattered ecchymosis over the forearms EYES: normal, conjunctiva are pink and non-injected, sclera clear OROPHARYNX:no exudate, no erythema and lips, buccal mucosa, and tongue normal  NECK: supple, thyroid normal size, non-tender, without nodularity LYMPH:  no palpable lymphadenopathy in the cervical, axillary or inguinal LUNGS: clear to auscultation and percussion with normal breathing effort HEART: regular rate & rhythm and no murmurs and no lower extremity edema ABDOMEN: soft,Mildly distended, with some tenderness at the upper quadrants, he has decreased bowel sounds.Well-healed surgical scar Musculoskeletal:no cyanosis of digits and no clubbing  PSYCH: alert & oriented x 3 with fluent speech NEURO: no focal motor/sensory deficits    CBG (last 3)  No results for input(s): GLUCAP in the last 72 hours.   Labs:   Recent Labs Lab 10/13/14 1804 10/14/14 0512  WBC 18.4* 20.4*  HGB  12.1* 8.7*  HCT 38.0* 27.7*  PLT 284 336  MCV 89.0 90.2  MCH 28.3 28.3  MCHC 31.8 31.4  RDW 25.6* 25.9*  LYMPHSABS 0.6* 1.2  MONOABS 0.7 1.2*  EOSABS 0.0 0.0  BASOSABS 0.0 0.0     Chemistries:    Recent Labs Lab 10/13/14 1804 10/14/14 0512  NA 138 138  K 3.5 3.7  CL 109 109  CO2 20 22  GLUCOSE 87 80  BUN 16 17  CREATININE 0.75 0.78  CALCIUM 7.5* 7.5*  AST 28 22  ALT 16 15  ALKPHOS 518* 479*  BILITOT 1.6* 1.4*    GFR Estimated Creatinine Clearance: 82.8 mL/min (by C-G formula based on Cr of 0.78).  Liver Function Tests:  Recent Labs Lab 10/13/14 1804 10/14/14 0512  AST 28 22  ALT 16 15  ALKPHOS 518* 479*  BILITOT 1.6* 1.4*  PROT 5.9* 5.7*  ALBUMIN 1.6* 1.5*    Recent Labs Lab 10/13/14 1804  LIPASE 14   No results for input(s): AMMONIA in the last 168 hours.  Urine Studies     Component Value Date/Time   COLORURINE YELLOW 12/07/2013 1031   APPEARANCEUR CLEAR 12/07/2013 1031   LABSPEC 1.012 12/07/2013 1031   PHURINE 6.0 12/07/2013 1031   GLUCOSEU NEGATIVE 12/07/2013 1031   HGBUR NEGATIVE 12/07/2013 1031   BILIRUBINUR NEGATIVE 12/07/2013 1031   KETONESUR NEGATIVE 12/07/2013 1031   PROTEINUR NEGATIVE 12/07/2013 1031   UROBILINOGEN 0.2 12/07/2013 1031   NITRITE NEGATIVE 12/07/2013 1031   LEUKOCYTESUR NEGATIVE 12/07/2013 1031     Imaging Studies:  Ct Abdomen Pelvis W Contrast  10/13/2014   CLINICAL DATA:  Mid abdominal pain and diarrhea beginning 10/12/2014. History of metastatic  colon cancer  EXAM: CT ABDOMEN AND PELVIS WITH CONTRAST  TECHNIQUE: Multidetector CT imaging of the abdomen and pelvis was performed using the standard protocol following bolus administration of intravenous contrast.  CONTRAST:  25 mL OMNIPAQUE IOHEXOL 300 MG/ML SOLN, 100 mL OMNIPAQUE IOHEXOL 300 MG/ML SOLN  COMPARISON:  CT abdomen and pelvis 09/02/2014 and 05/26/2014.  FINDINGS: The patient has a new very small right pleural effusion. No left pleural effusion is  present and no pericardial effusion is identified.  As on the prior examination, multiple mass lesions are seen in the liver consistent with metastatic disease. Index lesion in the right hepatic lobe which had measured 12.2 x 8.7 cm today measures 12.7 x 7.6 cm on image 24. Difference in measurement is likely due to lack of contrast on the prior examination. The spleen, adrenal glands, pancreas and kidneys are unremarkable.  There is a moderate volume of abdominal and pelvic ascites which is increased somewhat since the prior study. Extensive abdominal lymphadenopathy is again seen. Omental and mesenteric nodularity is again identified.  Index porta hepatis noted which had measured 3.2 x 4.9 cm today measures 4.0 x 4.8 cm on image 31.  Left periaortic node which had measured 2.9 x 2.5 cm today measures 2.9 x 2.8 cm on image 40.  Mass in the central mesentery anterior to the aorta which had measured 3.4 x 1.9 cm today measures 4.2 x 2.8 cm on image 52.  The patient is status post colectomy and Hartmann's pouch formation. There is no evidence of bowel obstruction.  No lytic or sclerotic bony lesion is identified.  IMPRESSION: Progression of metastatic disease in the abdomen and pelvis with increased lymphadenopathy identified. There has also been increase in abdominal and pelvic ascites which now appears moderate. Hepatic metastases are not notably change.  New small right pleural effusion.  Status post colectomy.   Electronically Signed   By: Inge Rise M.D.   On: 10/13/2014 19:32   Dg Chest Port 1 View  10/13/2014   CLINICAL DATA:  Per CC nurse-came in today with abdominal pain starting yesterday. Took 2 Percocet at 1330 today. Has had diarrhea, decreased PO intake, increasing abdominal pain for the past few days with increased fatigue. Receiving chemo infusions currently. Denies fevers, SOB. RR even/unlabored. Has had acid reflux at home. No other c/c. Hx colon cancer, diabetic, ex-smoker  EXAM: PORTABLE  CHEST - 1 VIEW  COMPARISON:  06/20/2006  FINDINGS: Cardiac silhouette normal in size and configuration. No mediastinal or hilar masses or evidence of adenopathy. Right anterior chest wall power Port-A-Cath extends through the right internal jugular vein to have its tip in the lower superior vena cava.  Clear lungs.  No pleural effusion or pneumothorax.  Bony thorax is demineralized but grossly intact.  IMPRESSION: No acute cardiopulmonary disease.   Electronically Signed   By: Lajean Manes M.D.   On: 10/13/2014 20:13    Assessment/Plan: 69 y.o.   Adenocarcinoma of the small bowel, stage III (T3 N1), status post a partial small bowel resection 10/14/2013  Status post Cycle 2 Pembrolizumab 10/04/2014  Abdominal pain This is likely due to progression of the disease, as well as ascites, as demonstrated by CT scans This is being better controlled with IV pain medications. If ascites progresses, paracentesis may be entertained, for relief Appreciate primary care team involvement  Nausea  This controlled with antiemetics  Diarrhea This is likely secondary to recent chemotherapy Patient has been placed in enteric precautions, stool cultures are pending  Continue supportive care  Malnutrition The patient has lost 45 pounds over the last 2 months. This is exacerbated due to nausea, and diarrhea Nutrition consult has been requested We will check prealbumin Pharmacy consult has been obtained, for TNA, due to significant muscle wasting He is also has been initiated on Solu-Medrol 80 mg IV 3 times a day  Anemia Due to recent chemotherapy, malnutrition, dilution and chronic disease No bleeding lesions are noted No transfusion is indicated at this time Monitor counts closely Transfuse blood to maintain a Hb of 8 g or if the patient is acutely bleeding  Leukocytosis This is likely reactive, as well as secondary to recent steroids No intervention is indicated at this time Will continue to  monitor, As patient now is to be on steroids as well  Malnutrition Consider Nutrition evaluation Appreciate Nutrition follow up  DVT prophylaxis On Lovenox  Full code  Other medical issues as per admitting team     **Disclaimer: This note was dictated with voice recognition software. Similar sounding words can inadvertently be transcribed and this note may contain transcription errors which may not have been corrected upon publication of note.** WERTMAN,SARA E, PA-C 10/14/2014  8:10 AM   ADDENDUM:  I saw and examined the patient with Clarise Cruz. I appreciate the admission by the hospitalist.  The abdominal symptoms that he is having could be from the [facility] that he is taking. It can cause diarrhea. As such, I will put him on some high-dose steroids.  He has increased ascites. Urine though his abdominal exam does not show a lot of distention, I think he would benefit from a paracentesis.  I also think that he needs to be given nutrition via TNA. We will get pharmacy to manage this.  He is quite cachectic. He has very little muscle mass. It may help to get nutrition and to see him.  I realize of the CT scan shows some progression of his disease. However, with immunotherapy, we have to be patient and continue to treat him as responses often are not seen for 6-8 weeks.  I would have to follow his labs closely.  Again, we very much appreciate the great care that he is getting.  [place] [phone]

## 2014-10-14 NOTE — Progress Notes (Signed)
Patient ID: Dean Owens, male   DOB: 06/10/1946, 69 y.o.   MRN: [id number]  [facility] PROGRESS NOTE  DYSON SEVEY WUJ:[id number] DOB: [birth year] DOA: 10/13/2014 PCP: Horton Finer, MD   Brief narrative:    69 y.o. 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   In the ED, CT abdomen and pelvis c/w progression of the tumor.   Assessment/Plan:    Adenocarcinoma of the small bowel, stage III (T3 N1), status post a partial small bowel resection 10/14/2013  - appreciate Dr. Jonette Eva following   Abdominal pain with nausea  - likely secondary to progression of the disease - now better controlled with analgesia and antiemetics provided  - if ascites worse, will consider therapeutic paracentesis   Diarrhea - likely secondary to recent chemotherapy - stool cultures are pending - Continue supportive care  Malnutrition - lost 45 pounds over the last 2 months. - TNA per pharmacy   Anemia secondary to chemo and malnutrition, chronic disease  - No transfusion is indicated at this time - transfuse for Hg < 8  Leukocytosis - likely reactive and also secondary to recent steroids - repeat CBC in AM  DVT prophylaxis - On Lovenox  Code Status: Full.  Family Communication:  plan of care discussed with the patient Disposition Plan: Home when stable.   IV access:  Peripheral IV  Procedures and diagnostic studies:    Ct Abdomen Pelvis W Contrast  10/13/2014  Progression of metastatic disease in the abdomen and pelvis with increased lymphadenopathy identified. There has also been increase in abdominal and pelvic ascites which now appears moderate. Hepatic metastases are not notably change.  New small right pleural effusion.    Dg Chest Port 1 View  10/13/2014   No acute cardiopulmonary  disease.     Medical Consultants:  Oncology  Other Consultants:  None  IAnti-Infectives:   None  Faye Ramsay, MD  [facility] Pager [phone]  If 7PM-7AM, please contact night-coverage [email or web address] Password [facility] 10/14/2014, 8:34 PM   LOS: 1 day   HPI/Subjective: No events overnight.   Objective: Filed Vitals:   10/13/14 2115 10/14/14 0512 10/14/14 1259 10/14/14 1337  BP: 129/77 134/80 97/58 98/53   Pulse: 95 81 103   Temp: 97.7 F (36.5 C) 98 F (36.7 C) 97.8 F (36.6 C)   TempSrc: Oral Oral Oral   Resp: 20 18 16    Height:  5\' 11"  (1.803 m)    Weight:  66.18 kg (145 lb 14.4 oz)    SpO2: 98% 99% 98%     Intake/Output Summary (Last 24 hours) at 10/14/14 2034 Last data filed at 10/14/14 1744  Gross per 24 hour  Intake   1210 ml  Output    150 ml  Net   1060 ml    Exam:   General:  Pt is alert, follows commands appropriately, not in acute distress  Cardiovascular: Regular rate and rhythm, no rubs, no gallops  Respiratory: Clear to auscultation bilaterally, no wheezing, no crackles, no rhonchi  Abdomen: Soft, non tender, non distended, bowel sounds present, no guarding  Extremities: pulses DP and PT palpable bilaterally  Neuro: Grossly nonfocal  Data Reviewed: Basic Metabolic Panel:  Recent Labs Lab 10/13/14 1804 10/14/14 0512 10/14/14 1100  NA 138 138  --   K 3.5 3.7  --   CL 109 109  --  CO2 20 22  --   GLUCOSE 87 80  --   BUN 16 17  --   CREATININE 0.75 0.78  --   CALCIUM 7.5* 7.5*  --   MG  --   --  1.7  PHOS  --   --  3.7   Liver Function Tests:  Recent Labs Lab 10/13/14 1804 10/14/14 0512  AST 28 22  ALT 16 15  ALKPHOS 518* 479*  BILITOT 1.6* 1.4*  PROT 5.9* 5.7*  ALBUMIN 1.6* 1.5*    Recent Labs Lab 10/13/14 1804  LIPASE 14   CBC:  Recent Labs Lab 10/13/14 1804 10/14/14 0512  WBC 18.4* 20.4*  NEUTROABS 17.1* 18.0*  HGB 12.1* 8.7*  HCT 38.0* 27.7*  MCV 89.0 90.2  PLT 284 336   CBG:  Recent Labs Lab  10/14/14 1345 10/14/14 1652  GLUCAP 74 99    Recent Results (from the past 240 hour(s))  Clostridium Difficile by PCR     Status: None   Collection Time: 10/14/14  2:14 AM  Result Value Ref Range Status   C difficile by pcr NEGATIVE NEGATIVE Final    Comment: Performed at [facility]     Scheduled Meds: . enoxaparin (LOVENOX) injection  40 mg Subcutaneous QHS  . ferrous sulfate  325 mg Oral Q breakfast  . insulin aspart  0-9 Units Subcutaneous 4 times per day  . levothyroxine  25 mcg Intravenous Daily  . lidocaine-prilocaine   Topical Once  . megestrol  200 mg Oral BID  . methylPREDNISolone (SOLU-MEDROL) injection  80 mg Intravenous TID  . pantoprazole (PROTONIX) IV  40 mg Intravenous QHS   Continuous Infusions: . sodium chloride 60 mL/hr at 10/14/14 1752  . [START ON 10/15/2014] sodium chloride    . Marland KitchenTPN (CLINIMIX-E) Adult 40 mL/hr at 10/14/14 1749   And  . fat emulsion 240 mL (10/14/14 1749)

## 2014-10-15 DIAGNOSIS — R1084 Generalized abdominal pain: Secondary | ICD-10-CM | POA: Insufficient documentation

## 2014-10-15 DIAGNOSIS — R1011 Right upper quadrant pain: Secondary | ICD-10-CM

## 2014-10-15 DIAGNOSIS — R7989 Other specified abnormal findings of blood chemistry: Secondary | ICD-10-CM

## 2014-10-15 LAB — CBC WITH DIFFERENTIAL/PLATELET
BASOS PCT: 0 % (ref 0–1)
Basophils Absolute: 0 10*3/uL (ref 0.0–0.1)
EOS PCT: 0 % (ref 0–5)
Eosinophils Absolute: 0 10*3/uL (ref 0.0–0.7)
HCT: 24.5 % — ABNORMAL LOW (ref 39.0–52.0)
Hemoglobin: 7.8 g/dL — ABNORMAL LOW (ref 13.0–17.0)
Lymphocytes Relative: 4 % — ABNORMAL LOW (ref 12–46)
Lymphs Abs: 0.5 10*3/uL — ABNORMAL LOW (ref 0.7–4.0)
MCH: 28.8 pg (ref 26.0–34.0)
MCHC: 31.8 g/dL (ref 30.0–36.0)
MCV: 90.4 fL (ref 78.0–100.0)
MONOS PCT: 2 % — AB (ref 3–12)
Monocytes Absolute: 0.3 10*3/uL (ref 0.1–1.0)
Neutro Abs: 12.8 10*3/uL — ABNORMAL HIGH (ref 1.7–7.7)
Neutrophils Relative %: 94 % — ABNORMAL HIGH (ref 43–77)
Platelets: 252 10*3/uL (ref 150–400)
RBC: 2.71 MIL/uL — ABNORMAL LOW (ref 4.22–5.81)
RDW: 26.2 % — AB (ref 11.5–15.5)
WBC: 13.6 10*3/uL — AB (ref 4.0–10.5)

## 2014-10-15 LAB — MAGNESIUM: MAGNESIUM: 1.8 mg/dL (ref 1.5–2.5)

## 2014-10-15 LAB — GLUCOSE, CAPILLARY
GLUCOSE-CAPILLARY: 246 mg/dL — AB (ref 70–99)
GLUCOSE-CAPILLARY: 193 mg/dL — AB (ref 70–99)
Glucose-Capillary: 261 mg/dL — ABNORMAL HIGH (ref 70–99)
Glucose-Capillary: 208 mg/dL — ABNORMAL HIGH (ref 70–99)

## 2014-10-15 LAB — PHOSPHORUS: Phosphorus: 3.5 mg/dL (ref 2.3–4.6)

## 2014-10-15 LAB — COMPREHENSIVE METABOLIC PANEL
ALT: 14 U/L (ref 0–53)
AST: 22 U/L (ref 0–37)
Albumin: 1.4 g/dL — ABNORMAL LOW (ref 3.5–5.2)
Alkaline Phosphatase: 423 U/L — ABNORMAL HIGH (ref 39–117)
Anion gap: 7 (ref 5–15)
BILIRUBIN TOTAL: 1 mg/dL (ref 0.3–1.2)
BUN: 18 mg/dL (ref 6–23)
CALCIUM: 7.6 mg/dL — AB (ref 8.4–10.5)
CHLORIDE: 107 mmol/L (ref 96–112)
CO2: 23 mmol/L (ref 19–32)
Creatinine, Ser: 0.77 mg/dL (ref 0.50–1.35)
GFR calc Af Amer: >90 mL/min (ref >90)
GLUCOSE: 261 mg/dL — AB (ref 70–99)
POTASSIUM: 3.7 mmol/L (ref 3.5–5.1)
SODIUM: 137 mmol/L (ref 135–145)
Total Protein: 5.3 g/dL — ABNORMAL LOW (ref 6.0–8.3)

## 2014-10-15 LAB — TRIGLYCERIDES: TRIGLYCERIDES: 209 mg/dL — AB (ref <150)

## 2014-10-15 LAB — PREALBUMIN: Prealbumin: <3 mg/dL — ABNORMAL LOW (ref 17.0–34.0)

## 2014-10-15 LAB — PREPARE RBC (CROSSMATCH)

## 2014-10-15 MED ORDER — TRACE MINERALS CR-CU-F-FE-I-MN-MO-SE-ZN IV SOLN
INTRAVENOUS | Status: AC
  Administered 2014-10-15: 17:00:00 via INTRAVENOUS
  Filled 2014-10-15: qty 960

## 2014-10-15 MED ORDER — FAT EMULSION 20 % IV EMUL
240.0000 mL | INTRAVENOUS | Status: AC
  Administered 2014-10-15: 240 mL via INTRAVENOUS
  Filled 2014-10-15: qty 250

## 2014-10-15 MED ORDER — TRACE MINERALS CR-CU-F-FE-I-MN-MO-SE-ZN IV SOLN: INTRAVENOUS | Status: DC

## 2014-10-15 MED ORDER — SODIUM CHLORIDE 0.9 % IV SOLN
Freq: Once | INTRAVENOUS | Status: AC
  Administered 2014-10-15: 13:00:00 via INTRAVENOUS

## 2014-10-15 MED ORDER — INSULIN ASPART 100 UNIT/ML ~~LOC~~ SOLN
0.0000 [IU] | SUBCUTANEOUS | Status: DC
  Administered 2014-10-15: 3 [IU] via SUBCUTANEOUS
  Administered 2014-10-15: 2 [IU] via SUBCUTANEOUS
  Administered 2014-10-15: 3 [IU] via SUBCUTANEOUS
  Administered 2014-10-16 (×5): 2 [IU] via SUBCUTANEOUS
  Administered 2014-10-16: 1 [IU] via SUBCUTANEOUS
  Administered 2014-10-17: 3 [IU] via SUBCUTANEOUS
  Administered 2014-10-17 (×2): 2 [IU] via SUBCUTANEOUS
  Administered 2014-10-17: 3 [IU] via SUBCUTANEOUS
  Administered 2014-10-17: 2 [IU] via SUBCUTANEOUS
  Administered 2014-10-18: 1 [IU] via SUBCUTANEOUS

## 2014-10-15 MED ORDER — FAT EMULSION 20 % IV EMUL: 240.0000 mL | INTRAVENOUS | Status: DC

## 2014-10-15 NOTE — Progress Notes (Signed)
Patient ID: Dean Owens, male   DOB: September 30, 1945, 69 y.o.   MRN: [id number]  [facility] PROGRESS NOTE  DONALD MEMOLI [id number]:[id number] DOB: 02-12-1946 DOA: 10/13/2014 PCP: Horton Finer, MD  Brief narrative:    69 y.o. 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   In the ED, CT abdomen and pelvis c/w progression of the tumor.   Assessment/Plan:   Adenocarcinoma of the small bowel, stage III (T3 N1), status post a partial small bowel resection 10/14/2013  - appreciate Dr. Jonette Eva following   Abdominal pain with nausea  - likely secondary to progression of the disease - now better controlled with analgesia and antiemetics provided  - if ascites worse, will consider therapeutic paracentesis   Diarrhea - likely secondary to recent chemotherapy - stool cultures still pending - Continue supportive care  Malnutrition - lost 45 pounds over the last 2 months. - TNA per pharmacy, so far tolerating well   Anemia secondary to chemo and malnutrition, chronic disease  - No transfusion is indicated at this time - Hg drop over the past 24 hours, transfuse 1 U PRBC today   Hyperglycemia - steroid induced - place on SSI for now only   Leukocytosis - likely reactive and also secondary to recent steroids - CBC in AM - repeat CBC in AM   Hypothyroidism - continue synthroid   Acute functional quadriplegia - from rather aggressive nature of acute/chronic illness - still bed bound  - will try to get OOB to chair if possible   DVT prophylaxis - On Lovenox  Code Status: Full.  Family Communication: plan of care discussed with the patient and wife at bedside  Disposition Plan: Requires continued hospitalization   IV access:  Peripheral IV  Procedures and diagnostic studies:     None  Medical Consultants:  Oncology   Other Consultants:  None   IAnti-Infectives:   None   Faye Ramsay, MD  [facility] Pager [phone]  If 7PM-7AM, please contact night-coverage [email or web address] Password [email or web address] 10/15/2014, 9:22 AM   LOS: 2 days   HPI/Subjective: No events overnight.   Objective: Filed Vitals:   10/14/14 1259 10/14/14 1337 10/14/14 2105 10/15/14 0537  BP: 97/58 98/53 133/80 118/81  Pulse: 103  76 74  Temp: 97.8 F (36.6 C)  98.1 F (36.7 C) 98.2 F (36.8 C)  TempSrc: Oral  Oral Oral  Resp: 16  16 16   Height:      Weight:      SpO2: 98%  99% 99%    Intake/Output Summary (Last 24 hours) at 10/15/14 1937 Last data filed at 10/15/14 0600  Gross per 24 hour  Intake 1200.16 ml  Output      0 ml  Net 1200.16 ml    Exam:   General:  Pt is frail and weak, reports feeling better   Cardiovascular: Regular rate and rhythm,  no rubs, no gallops  Respiratory: Clear to auscultation bilaterally, no wheezing, no crackles, no rhonchi  Abdomen: Soft, non tender, slightly distended, bowel sounds present, no guarding  Extremities: pulses DP and PT palpable bilaterally  Neuro: Grossly nonfocal  Data Reviewed: Basic Metabolic Panel:  Recent Labs Lab 10/13/14 1804 10/14/14 0512 10/14/14 1100 10/15/14 0434  NA 138 138  --  137  K 3.5 3.7  --  3.7  CL 109 109  --  107  CO2 20 22  --  23  GLUCOSE 87 80  --  261*  BUN 16 17  --  18  CREATININE 0.75 0.78  --  0.77  CALCIUM 7.5* 7.5*  --  7.6*  MG  --   --  1.7 1.8  PHOS  --   --  3.7 3.5   Liver Function Tests:  Recent Labs Lab 10/13/14 1804 10/14/14 0512 10/15/14 0434  AST 28 22 22   ALT 16 15 14   ALKPHOS 518* 479* 423*  BILITOT 1.6* 1.4* 1.0  PROT 5.9* 5.7* 5.3*  ALBUMIN 1.6* 1.5* 1.4*    Recent Labs Lab 10/13/14 1804  LIPASE 14   CBC:  Recent Labs Lab 10/13/14 1804 10/14/14 0512 10/15/14 0449  WBC 18.4* 20.4* 13.6*  NEUTROABS 17.1* 18.0* 12.8*  HGB 12.1* 8.7* 7.8*   HCT 38.0* 27.7* 24.5*  MCV 89.0 90.2 90.4  PLT 284 336 252  CBG:  Recent Labs Lab 10/14/14 1345 10/14/14 1652 10/14/14 2347 10/15/14 0523  GLUCAP 74 99 210* 261*    Recent Results (from the past 240 hour(s))  Clostridium Difficile by PCR     Status: None   Collection Time: 10/14/14  2:14 AM  Result Value Ref Range Status   C difficile by pcr NEGATIVE NEGATIVE Final    Comment: Performed at [facility]     Scheduled Meds: . sodium chloride   Intravenous Once  . enoxaparin (LOVENOX) injection  40 mg Subcutaneous QHS  . insulin aspart  0-9 Units Subcutaneous 4 times per day  . levothyroxine  25 mcg Intravenous Daily  . lidocaine-prilocaine   Topical Once  . megestrol  200 mg Oral BID  . methylPREDNISolone (SOLU-MEDROL) injection  80 mg Intravenous TID  . pantoprazole (PROTONIX) IV  40 mg Intravenous QHS   Continuous Infusions: . sodium chloride    . Marland KitchenTPN (CLINIMIX-E) Adult 40 mL/hr at 10/14/14 1749   And  . fat emulsion 240 mL (10/14/14 1749)

## 2014-10-15 NOTE — Progress Notes (Signed)
PARENTERAL NUTRITION CONSULT NOTE - Follow-up  Pharmacy Consult for TPN Indication: SB Ca, abd pain  No Known Allergies  Patient Measurements: Height: 5' 11"  (180.3 cm) Weight: 145 lb 14.4 oz (66.18 kg) IBW/kg (Calculated) : 75.3  Vital Signs: Temp: 98.2 F (36.8 C) (02/20 0537) Temp Source: Oral (02/20 0537) BP: 118/81 mmHg (02/20 0537) Pulse Rate: 74 (02/20 0537) Intake/Output from previous day: 02/19 0701 - 02/20 0700 In: 1200.2 [P.O.:340; I.V.:251; TPN:609.2] Out: 0  Intake/Output from this shift:    Labs:  Recent Labs  10/13/14 1804 10/14/14 0512 10/15/14 0449  WBC 18.4* 20.4* 13.6*  HGB 12.1* 8.7* 7.8*  HCT 38.0* 27.7* 24.5*  PLT 284 336 252    Recent Labs  10/13/14 1804 10/14/14 0512 10/14/14 1100 10/14/14 1130 10/15/14 0434  NA 138 138  --   --  137  K 3.5 3.7  --   --  3.7  CL 109 109  --   --  107  CO2 20 22  --   --  23  GLUCOSE 87 80  --   --  261*  BUN 16 17  --   --  18  CREATININE 0.75 0.78  --   --  0.77  CALCIUM 7.5* 7.5*  --   --  7.6*  MG  --   --  1.7  --  1.8  PHOS  --   --  3.7  --  3.5  PROT 5.9* 5.7*  --   --  5.3*  ALBUMIN 1.6* 1.5*  --   --  1.4*  AST 28 22  --   --  22  ALT 16 15  --   --  14  ALKPHOS 518* 479*  --   --  423*  BILITOT 1.6* 1.4*  --   --  1.0  PREALBUMIN  --   --   --  1.7*  --   Corrected Ca: 9.1 Estimated Creatinine Clearance: 82.8 mL/min (by C-G formula based on Cr of 0.77).    Recent Labs  10/14/14 1652 10/14/14 2347 10/15/14 0523  GLUCAP 99 210* 261*    Medical History: Past Medical History  Diagnosis Date  . Hypertension   . Type 2 diabetes mellitus   . Hypothyroidism   . History of colon cancer NO RECURRENCE    S/P COLECTOMY X3  LAST ONE 1998--  [place]  . ED (erectile dysfunction) of organic origin   . Right knee DJD   . Eczema   . OA (osteoarthritis)     LEFT KNEE  . Wears glasses   . History of iron deficiency anemia   . History of squamous cell carcinoma excision    . Accelerated hypertension 10/21/2013  . Type II diabetes mellitus 10/21/2013  . Unspecified hypothyroidism 10/21/2013  . 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 Lynch syndrome   . History of nonmelanoma skin cancer   . Anemia   . Recurrent prostate carcinoma FOLLOWED BY DR Gaynelle Arabian    DX 2007  S/P RADIOACTIVE SEED IMPLANTS  . 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 Ileus     s/p op 3 days  . History of blood transfusion 09/2013    PRBC  . S/P radiation therapy 01/19/14-03/01/14    retroperitonela sarcoma/abdomen   Insulin Requirements: 8 units  Current Nutrition: CL diet  IVF: NS at [facility] access: has PAC, needs peripheral access TPN start date: 2/19  ASSESSMENT  HPI: complaint of 3 day hx of abd pain, nausea and diarrhea. Hx of SB carcinoma treated with Folfox x5 cycles, now Pembrolizumab, Cycle 2 received 2/9. Previous Colon Ca treated 1998, Prostate Ca, now SB adenocarcinoma, small bowel resection 10/14/13, with progression, liver mets.  Hx of DM, no DM meds PTA, on SoluMedrol 22m IV q8hr  Significant events:   Today:   Glucose - elevated, CBGs 210-261   Electrolytes - wnl  Renal - wnl  LFTs - Bili back WNL, Alk PO4 elevated (liver mets)  TGs - pending  Prealbumin - 1.7  CLD - poor appetite charted  NUTRITIONAL GOALS                                                                                             RD recs: Kcal: 2000-2200 kcals, Protein: 95-110 g protein, Fluid: >/= 2 L/day  Clinimix E 5/15 at a goal rate of 83 ml/hr + 20% fat emulsion at 10 ml/hr to provide: 100 g/day protein, 1912 Kcal/day.  PLAN                                                                                                                         At 1800 today:  Continue Clinimix E 5/15 at 40 ml/hr.  20% fat emulsion at 10 ml/hr.  Plan to advance as  tolerated to the goal rate.  TPN to contain standard multivitamins and trace elements.  Add insulin 10 units   Change to SSI q6h  TPN lab panels on Mondays & Thursdays.  F/u daily, assess for re-feeding syndrome, recheck Mag, Phos tomorrow AM  CRalene Bathe PharmD, BCPS 10/15/2014, 11:01 AM  Pager: [phone]

## 2014-10-15 NOTE — Progress Notes (Signed)
Dean Owens is not having any diarrhea. He said that he did not have any diarrhea yesterday. His C. difficile is negative.  I put him on some high-dose Solu-Medrol. This was as a means of treating the diarrhea if this was secondary to his immunotherapy.  He is on TNA.  His prealbumin is on 1.7. This is an incredibly ominous prognostic factor in my mind.  He was somewhat lethargic this morning. I don't know if he had gotten any pain medicine or not.  His hemoglobin had dropped to 7.8. Given his frail condition, I think he would benefit from one unit of blood. I talked him about this. He agrees to have this. He has had a blood transfusion before.  I will check iron studies on him. I would suspect that his iron levels are probably on the low side.  He did not have a paracentesis. I think we can probably hold on this for right now. His abdomen is not distended. I really cannot palpate fluid. He is having some clear liquids.  On his physical exam, his vital signs pretty stable. Temperature 98.2. Pulse 84. Blood pressure 118/81. His abdomen is somewhat soft. There is no obvious distention. Bowel sounds are somewhat decreased. He has some tenderness over on the left side. No obvious mass is noted. Extremities shows marked muscle atrophy. He is somewhat cachectic.  I tried to talk to him about end-of-life issues. He was just too lethargic to really understand. However, given that his prealbumin is a 11.7, I would be incredibly shocked if he made it through March. I think any further stress on his system will cause him to decline very quickly. I think when Dr. Benay Spice comes back from vacation, he will have to talk to Dean Owens about end-of-life issues. I will try best to approach this again with him.  I will come back the steroid dose a little bit.  I appreciate the great care that he is getting on [address] from everybody!!!  Pete E.  Romans 8:28

## 2014-10-16 DIAGNOSIS — D6481 Anemia due to antineoplastic chemotherapy: Secondary | ICD-10-CM

## 2014-10-16 DIAGNOSIS — M542 Cervicalgia: Secondary | ICD-10-CM

## 2014-10-16 DIAGNOSIS — I81 Portal vein thrombosis: Secondary | ICD-10-CM

## 2014-10-16 DIAGNOSIS — R109 Unspecified abdominal pain: Secondary | ICD-10-CM

## 2014-10-16 DIAGNOSIS — C786 Secondary malignant neoplasm of retroperitoneum and peritoneum: Secondary | ICD-10-CM

## 2014-10-16 DIAGNOSIS — R638 Other symptoms and signs concerning food and fluid intake: Secondary | ICD-10-CM

## 2014-10-16 DIAGNOSIS — C787 Secondary malignant neoplasm of liver and intrahepatic bile duct: Secondary | ICD-10-CM

## 2014-10-16 DIAGNOSIS — R188 Other ascites: Secondary | ICD-10-CM

## 2014-10-16 LAB — TYPE AND SCREEN
ABO/RH(D): O POS
ANTIBODY SCREEN: NEGATIVE
Unit division: 0

## 2014-10-16 LAB — GLUCOSE, CAPILLARY
GLUCOSE-CAPILLARY: 183 mg/dL — AB (ref 70–99)
GLUCOSE-CAPILLARY: 197 mg/dL — AB (ref 70–99)
Glucose-Capillary: 147 mg/dL — ABNORMAL HIGH (ref 70–99)
Glucose-Capillary: 153 mg/dL — ABNORMAL HIGH (ref 70–99)
Glucose-Capillary: 168 mg/dL — ABNORMAL HIGH (ref 70–99)
Glucose-Capillary: 174 mg/dL — ABNORMAL HIGH (ref 70–99)
Glucose-Capillary: 225 mg/dL — ABNORMAL HIGH (ref 70–99)

## 2014-10-16 LAB — COMPREHENSIVE METABOLIC PANEL
ALBUMIN: 1.7 g/dL — AB (ref 3.5–5.2)
ALK PHOS: 427 U/L — AB (ref 39–117)
ALT: 19 U/L (ref 0–53)
AST: 28 U/L (ref 0–37)
Anion gap: 8 (ref 5–15)
BUN: 17 mg/dL (ref 6–23)
CHLORIDE: 108 mmol/L (ref 96–112)
CO2: 23 mmol/L (ref 19–32)
CREATININE: 0.61 mg/dL (ref 0.50–1.35)
Calcium: 8.2 mg/dL — ABNORMAL LOW (ref 8.4–10.5)
GFR calc Af Amer: >90 mL/min (ref >90)
GFR calc non Af Amer: >90 mL/min (ref >90)
GLUCOSE: 189 mg/dL — AB (ref 70–99)
POTASSIUM: 3.2 mmol/L — AB (ref 3.5–5.1)
SODIUM: 139 mmol/L (ref 135–145)
Total Bilirubin: 0.7 mg/dL (ref 0.3–1.2)
Total Protein: 6 g/dL (ref 6.0–8.3)

## 2014-10-16 LAB — MAGNESIUM: Magnesium: 1.8 mg/dL (ref 1.5–2.5)

## 2014-10-16 LAB — IRON AND TIBC
Iron: 34 ug/dL — ABNORMAL LOW (ref 42–165)
Saturation Ratios: 58 % — ABNORMAL HIGH (ref 20–55)
TIBC: 59 ug/dL — ABNORMAL LOW (ref 215–435)
UIBC: 25 ug/dL — ABNORMAL LOW (ref 125–400)

## 2014-10-16 LAB — FERRITIN: Ferritin: 637 ng/mL — ABNORMAL HIGH (ref 22–322)

## 2014-10-16 LAB — PHOSPHORUS: PHOSPHORUS: 2.7 mg/dL (ref 2.3–4.6)

## 2014-10-16 MED ORDER — POTASSIUM CHLORIDE CRYS ER 20 MEQ PO TBCR
40.0000 meq | EXTENDED_RELEASE_TABLET | Freq: Once | ORAL | Status: AC
  Administered 2014-10-16: 40 meq via ORAL
  Filled 2014-10-16: qty 2

## 2014-10-16 MED ORDER — POTASSIUM CHLORIDE CRYS ER 20 MEQ PO TBCR
30.0000 meq | EXTENDED_RELEASE_TABLET | Freq: Once | ORAL | Status: AC
  Administered 2014-10-16: 30 meq via ORAL
  Filled 2014-10-16: qty 1

## 2014-10-16 MED ORDER — M.V.I. ADULT IV INJ
INTRAVENOUS | Status: DC
  Administered 2014-10-16: 17:00:00 via INTRAVENOUS
  Filled 2014-10-16: qty 1440

## 2014-10-16 MED ORDER — FAT EMULSION 20 % IV EMUL
240.0000 mL | INTRAVENOUS | Status: DC
  Administered 2014-10-16: 240 mL via INTRAVENOUS
  Filled 2014-10-16: qty 250

## 2014-10-16 MED ORDER — FAT EMULSION 20 % IV EMUL: 240.0000 mL | INTRAVENOUS | Status: DC

## 2014-10-16 MED ORDER — POTASSIUM CHLORIDE 10 MEQ/100ML IV SOLN
10.0000 meq | INTRAVENOUS | Status: DC
  Filled 2014-10-16 (×2): qty 100

## 2014-10-16 MED ORDER — TRACE MINERALS CR-CU-F-FE-I-MN-MO-SE-ZN IV SOLN: INTRAVENOUS | Status: DC

## 2014-10-16 MED ORDER — POTASSIUM CHLORIDE 10 MEQ/100ML IV SOLN
10.0000 meq | INTRAVENOUS | Status: DC
  Administered 2014-10-16: 10 meq via INTRAVENOUS
  Filled 2014-10-16 (×4): qty 100

## 2014-10-16 NOTE — Progress Notes (Signed)
Patient ID: LOTUS GOVER, male   DOB: Apr 01, 1946, 69 y.o.   MRN: [id number]  [facility] PROGRESS NOTE  CLEDITH ABDOU RXV:[id number] DOB: 12-04-1945 DOA: 10/13/2014 PCP: Horton Finer, MD   Brief narrative:    69 y.o. 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   In the ED, CT abdomen and pelvis c/w progression of the tumor.   Assessment/Plan:   denocarcinoma of the small bowel, stage III (T3 N1), status post a partial small bowel resection 10/14/2013  - appreciate oncology team assistance - please note, GOC discussed with wife at bedside, she has confirmed DNR status and wants [place] to focus on comfort care  - DNR status placed in EPIC   Abdominal pain with nausea  - likely secondary to progression of the disease - now better controlled with analgesia and antiemetics provided  - if ascites worse, will consider therapeutic paracentesis   Diarrhea - likely secondary to recent chemotherapy - Continue supportive care  Malnutrition - lost 45 pounds over the last 2 months. 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, chronic disease  - No transfusion is indicated at this time - transfused 1 U PRBC 2/20 - CBC in AM  Hyperglycemia - steroid induced - place on SSI for now only   Leukocytosis - likely reactive and also secondary to recent steroids - CBC in AM - repeat CBC in AM   Hypothyroidism - continue synthroid   Acute functional quadriplegia - from rather aggressive nature of acute/chronic illness - still bed bound  - will try to get OOB to chair if possible   DVT prophylaxis - On Lovenox  Code Status: DNR Family Communication: plan of care discussed with the  patient and wife at bedside  Disposition Plan: Requires continued hospitalization   IV access:  Peripheral IV  Procedures and diagnostic studies:    Ct Abdomen Pelvis W Contrast  10/13/2014  Progression of metastatic disease in the abdomen and pelvis with increased lymphadenopathy identified. There has also been increase in abdominal and pelvic ascites which now appears moderate. Hepatic metastases are not notably change.  New small right pleural effusion.  Status post colectomy.    CXR 10/13/2014  No acute cardiopulmonary disease.    Medical Consultants:  Oncology   Other Consultants:  None   IAnti-Infectives:   None  Faye Ramsay, MD  [facility] Pager [phone]  If 7PM-7AM, please contact night-coverage [email or web address] Password [facility] 10/16/2014, 7:52 AM   LOS: 3 days   HPI/Subjective: No events overnight.   Objective: Filed Vitals:   10/15/14 1520 10/15/14 2056 10/16/14 0439 10/16/14 0500  BP: 124/65 139/85 152/86   Pulse: 80 51 59   Temp: 97.5 F (36.4 C) 97.5 F (36.4 C) 97.3 F (36.3 C)   TempSrc: Oral Oral Oral   Resp: 18 18 18    Height:      Weight:    65.227 kg (143 lb 12.8 oz)  SpO2: 97% 95% 100%     Intake/Output Summary (Last 24 hours) at 10/16/14 0752 Last data filed at 10/16/14 0101  Gross per 24 hour  Intake   1080 ml  Output    400 ml  Net    680 ml    Exam:  General:  Pt is frail and weak, NAD  Cardiovascular: Regular rate and rhythm, S1/S2, no murmurs, no rubs, no gallops  Respiratory: Clear to auscultation bilaterally, no wheezing, diminished breath sounds at bases   Abdomen: Soft, bowel sounds present, no guarding  Extremities: No edema, pulses DP and PT palpable bilaterally  Data Reviewed: Basic Metabolic Panel:  Recent Labs Lab 10/13/14 1804 10/14/14 0512 10/14/14 1100 10/15/14 0434 10/16/14 0530  NA 138 138  --  137 139  K 3.5 3.7  --  3.7 3.2*  CL 109 109  --  107 108  CO2 20 22  --  23 23  GLUCOSE 87 80  --  261*  189*  BUN 16 17  --  18 17  CREATININE 0.75 0.78  --  0.77 0.61  CALCIUM 7.5* 7.5*  --  7.6* 8.2*  MG  --   --  1.7 1.8 1.8  PHOS  --   --  3.7 3.5 2.7   Liver Function Tests:  Recent Labs Lab 10/13/14 1804 10/14/14 0512 10/15/14 0434 10/16/14 0530  AST 28 22 22 28   ALT 16 15 14 19   ALKPHOS 518* 479* 423* 427*  BILITOT 1.6* 1.4* 1.0 0.7  PROT 5.9* 5.7* 5.3* 6.0  ALBUMIN 1.6* 1.5* 1.4* 1.7*    Recent Labs Lab 10/13/14 1804  LIPASE 14   No results for input(s): AMMONIA in the last 168 hours. CBC:  Recent Labs Lab 10/13/14 1804 10/14/14 0512 10/15/14 0449  WBC 18.4* 20.4* 13.6*  NEUTROABS 17.1* 18.0* 12.8*  HGB 12.1* 8.7* 7.8*  HCT 38.0* 27.7* 24.5*  MCV 89.0 90.2 90.4  PLT 284 336 252   Cardiac Enzymes: No results for input(s): CKTOTAL, CKMB, CKMBINDEX, TROPONINI in the last 168 hours. BNP: Invalid input(s): POCBNP CBG:  Recent Labs Lab 10/15/14 1133 10/15/14 1759 10/15/14 2051 10/16/14 0007 10/16/14 0423  GLUCAP 246* 208* 193* 168* 153*    Recent Results (from the past 240 hour(s))  Clostridium Difficile by PCR     Status: None   Collection Time: 10/14/14  2:14 AM  Result Value Ref Range Status   C difficile by pcr NEGATIVE NEGATIVE Final    Comment: Performed at [facility]     Scheduled Meds: . enoxaparin (LOVENOX) injection  40 mg Subcutaneous QHS  . insulin aspart  0-9 Units Subcutaneous 6 times per day  . levothyroxine  25 mcg Intravenous Daily  . lidocaine-prilocaine   Topical Once  . megestrol  200 mg Oral BID  . methylPREDNISolone (SOLU-MEDROL) injection  80 mg Intravenous TID  . pantoprazole (PROTONIX) IV  40 mg Intravenous QHS   Continuous Infusions: . Marland KitchenTPN (CLINIMIX-E) Adult 40 mL/hr at 10/15/14 1709   And  . fat emulsion 240 mL (10/15/14 1710)

## 2014-10-16 NOTE — Progress Notes (Signed)
PARENTERAL NUTRITION CONSULT NOTE - Follow-up  Pharmacy Consult for TPN Indication: SB Ca, abd pain  No Known Allergies  Patient Measurements: Height: _0  (180.3 cm) Weight: 143 lb 12.8 oz (65.227 kg) IBW/kg (Calculated) : 75.3  Vital Signs: Temp: 97.3 F (36.3 C) (02/21 0439) Temp Source: Oral (02/21 0439) BP: 152/86 mmHg (02/21 0439) Pulse Rate: 59 (02/21 0439) Intake/Output from previous day: 02/20 0701 - 02/21 0700 In: 1080 [P.O.:180; I.V.:10; Blood:330; TPN:560] Out: 400 [Urine:400] Intake/Output from this shift:    Labs:  Recent Labs  10/13/14 1804 10/14/14 0512 10/15/14 0449  WBC 18.4* 20.4* 13.6*  HGB 12.1* 8.7* 7.8*  HCT 38.0* 27.7* 24.5*  PLT 284 336 252    Recent Labs  10/14/14 0512 10/14/14 1100 10/14/14 1130 10/15/14 0434 10/15/14 0445 10/16/14 0530  NA 138  --   --  137  --  139  K 3.7  --   --  3.7  --  3.2*  CL 109  --   --  107  --  108  CO2 22  --   --  23  --  23  GLUCOSE 80  --   --  261*  --  189*  BUN 17  --   --  18  --  17  CREATININE 0.78  --   --  0.77  --  0.61  CALCIUM 7.5*  --   --  7.6*  --  8.2*  MG  --  1.7  --  1.8  --  1.8  PHOS  --  3.7  --  3.5  --  2.7  PROT 5.7*  --   --  5.3*  --  6.0  ALBUMIN 1.5*  --   --  1.4*  --  1.7*  AST 22  --   --  22  --  28  ALT 15  --   --  14  --  19  ALKPHOS 479*  --   --  423*  --  427*  BILITOT 1.4*  --   --  1.0  --  0.7  PREALBUMIN  --   --  1.7* <3.0*  --   --   TRIG  --   --   --   --  209*  --   Corrected Ca: 9.1 Estimated Creatinine Clearance: 81.5 mL/min (by C-G formula based on Cr of 0.61).    Recent Labs  10/16/14 0007 10/16/14 0423 10/16/14 0800  GLUCAP 168* 153* 147*    Medical History: Past Medical History  Diagnosis Date  . Hypertension   . Type 2 diabetes mellitus   . Hypothyroidism   . History of colon cancer NO RECURRENCE    S/P COLECTOMY X3  LAST ONE 1998--  [place]  . ED (erectile dysfunction) of organic origin   . Right knee DJD    . Eczema   . OA (osteoarthritis)     LEFT KNEE  . Wears glasses   . History of iron deficiency anemia   . History of squamous cell carcinoma excision   . Accelerated hypertension 10/21/2013  . Type II diabetes mellitus 10/21/2013  . Unspecified hypothyroidism 10/21/2013  . 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 Lynch syndrome   . History of nonmelanoma skin cancer   . Anemia   . Recurrent prostate carcinoma FOLLOWED BY DR Gaynelle Arabian    DX 2007  S/P RADIOACTIVE SEED IMPLANTS  . 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 Ileus     s/p op 3 days  . History of blood transfusion 09/2013    PRBC  . S/P radiation therapy 01/19/14-03/01/14    retroperitonela sarcoma/abdomen   Insulin Requirements: 13 units  Current Nutrition: CL diet  IVF: NS at [facility] access: has PAC, needs peripheral access TPN start date: 2/19  ASSESSMENT                                                                                                          HPI: complaint of 3 day hx of abd pain, nausea and diarrhea. Hx of SB carcinoma treated with Folfox x5 cycles, now Pembrolizumab, Cycle 2 received 2/9. Previous Colon Ca treated 1998, Prostate Ca, now SB adenocarcinoma, small bowel resection 10/14/13, with progression, liver mets.  Hx of DM, no DM meds PTA, on SoluMedrol 20m IV q8hr  Significant events:   Today:   Glucose - improved but still elevated, CBGs 147- 193 after insulin added to TPN last night  Electrolytes - K low, Phos WNL but trending down, other lytes ok  Renal - wnl  LFTs - Bili WNL, Alk PO4 elevated (liver mets)  TGs - elevated, 209  Prealbumin - 1.7  CLD - poor appetite per RN  NUTRITIONAL GOALS                                                                                             RD recs: Kcal: 2000-2200 kcals, Protein: 95-110 g protein, Fluid: >/= 2 L/day  Clinimix E 5/15 at a goal rate of 83 ml/hr + 20% fat emulsion at 10 ml/hr to provide: 100 g/day protein,  1912 Kcal/day.  PLAN                                                                                                                         At 1800 today: Increase to Clinimix E 5/15 at 60 ml/hr.  Continue with ~10 units insulin/L (=15 units/24h) -watch CBGs closely  20% fat emulsion at 10 ml/hr - watch TGs closely and adjust as warranted Plan to advance as tolerated to the goal rate. TPN to contain standard  multivitamins and trace elements Continue SSI q4h Note MD ordered KCl runs 65mq x 4 this morning, repeat another 126m x 4 tonight (RN administering via peripheral line, ok so far) TPN lab panels on Mondays & Thursdays. F/u daily  CoRalene BathePharmD, BCPS 10/16/2014, 10:28 AM  Pager: [phone]

## 2014-10-16 NOTE — Progress Notes (Signed)
CASWELL ALVILLAR   DOB:Jan 17, 1946   OV#:[id number]   YOK#:[id number]  Subjective: patient appears uncomfortable, tells me his belly hurts, describes it as "7 out of 10"; tells me he had a BM last night, can't describe it otherwise; falls asleep while talking to me; no family in room   Objective: middle aged White man examined in bed Filed Vitals:   10/16/14 0439  BP: 152/86  Pulse: 59  Temp: 97.3 F (36.3 C)  Resp: 18    Body mass index is 20.06 kg/(m^2).  Intake/Output Summary (Last 24 hours) at 10/16/14 0840 Last data filed at 10/16/14 0101  Gross per 24 hour  Intake   1080 ml  Output    400 ml  Net    680 ml     Lungs clear -- auscultated anterolaterally  Heart regular rate and rhythm  Abdomen soft, diminished but + BS  Neuro nonfocal    CBG (last 3)   Recent Labs  10/16/14 0007 10/16/14 0423 10/16/14 0800  GLUCAP 168* 153* 147*     Labs:  Lab Results  Component Value Date   WBC 13.6* 10/15/2014   HGB 7.8* 10/15/2014   HCT 24.5* 10/15/2014   MCV 90.4 10/15/2014   PLT 252 10/15/2014   NEUTROABS 12.8* 10/15/2014    @LASTCHEMISTRY @  Urine Studies No results for input(s): UHGB, CRYS in the last 72 hours.  Invalid input(s): UACOL, UAPR, USPG, UPH, UTP, UGL, UKET, UBIL, UNIT, UROB, [place], UEPI, UWBC, Junie Panning [place], [place], [place]  Basic Metabolic Panel:  Recent Labs Lab 10/13/14 1804 10/14/14 0512 10/14/14 1100 10/15/14 0434 10/16/14 0530  NA 138 138  --  137 139  K 3.5 3.7  --  3.7 3.2*  CL 109 109  --  107 108  CO2 20 22  --  23 23  GLUCOSE 87 80  --  261* 189*  BUN 16 17  --  18 17  CREATININE 0.75 0.78  --  0.77 0.61  CALCIUM 7.5* 7.5*  --  7.6* 8.2*  MG  --   --  1.7 1.8 1.8  PHOS  --   --  3.7 3.5 2.7   GFR Estimated Creatinine Clearance: 81.5 mL/min (by C-G formula based on Cr of 0.61). Liver Function Tests:  Recent Labs Lab 10/13/14 1804 10/14/14 0512 10/15/14 0434 10/16/14 0530  AST 28 22 22 28   ALT 16 15 14 19   ALKPHOS  518* 479* 423* 427*  BILITOT 1.6* 1.4* 1.0 0.7  PROT 5.9* 5.7* 5.3* 6.0  ALBUMIN 1.6* 1.5* 1.4* 1.7*    Recent Labs Lab 10/13/14 1804  LIPASE 14   No results for input(s): AMMONIA in the last 168 hours. Coagulation profile No results for input(s): INR, PROTIME in the last 168 hours.  CBC:  Recent Labs Lab 10/13/14 1804 10/14/14 0512 10/15/14 0449  WBC 18.4* 20.4* 13.6*  NEUTROABS 17.1* 18.0* 12.8*  HGB 12.1* 8.7* 7.8*  HCT 38.0* 27.7* 24.5*  MCV 89.0 90.2 90.4  PLT 284 336 252   Cardiac Enzymes: No results for input(s): CKTOTAL, CKMB, CKMBINDEX, TROPONINI in the last 168 hours. BNP: Invalid input(s): POCBNP CBG:  Recent Labs Lab 10/15/14 1759 10/15/14 2051 10/16/14 0007 10/16/14 0423 10/16/14 0800  GLUCAP 208* 193* 168* 153* 147*   D-Dimer No results for input(s): DDIMER in the last 72 hours. Hgb A1c No results for input(s): HGBA1C in the last 72 hours. Lipid Profile  Recent Labs  10/15/14 0445  TRIG 209*   Thyroid function  studies No results for input(s): TSH, T4TOTAL, T3FREE, THYROIDAB in the last 72 hours.  Invalid input(s): FREET3 Anemia work up No results for input(s): VITAMINB12, FOLATE, FERRITIN, TIBC, IRON, RETICCTPCT in the last 72 hours. Microbiology Recent Results (from the past 240 hour(s))  Clostridium Difficile by PCR     Status: None   Collection Time: 10/14/14  2:14 AM  Result Value Ref Range Status   C difficile by pcr NEGATIVE NEGATIVE Final    Comment: Performed at [facility]      Studies:  No results found.  Assessment: 69 y.o. [place] man with adenocarcinoma of the small bowel, stage IV (T3 N1 M1), status post a partial small bowel resection 10/14/2013   The tumor returned microsatellite instability high with loss of MSH2 expression   MRI of the liver 05/28/2014 with multiple hepatic metastases and portal/retroperitoneal lymphadenopathy, ultrasound-guided biopsy of the liver 05/31/2014 confirmed  metastatic small bowel carcinoma   Cycle 1 FOLFOX 06/08/2014  Cycle 2 FOLFOX with Neulasta support 06/29/2014  Cycle 3 FOLFOX 07/13/2014  Cycle 4 FOLFOX 08/03/2014  Cycle 5 FOLFOX 08/22/2014  Restaging CT 09/02/2014 revealed progression of abdominal lymphadenopathy and peritoneal carcinomatosis  Cycle 1 Pembrolizumab 09/13/2014  Cycle 2 Pembrolizumab 10/04/2014 2. Colon cancer X3, 1997 and 1999, status post an intra-abdominal colectomy with ileorectal anastomosis in May 1999  3. History of Iron deficiency anemia  4. Diabetes  5. Hypothyroidism  6. Hypertension  7. Family history of colon cancer-he has hereditary non-polyposis colon cancer syndrome  8. 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 Ascites-likely secondary to portal hypertension versus carcinomatosis, repeat peritoneal fluid cytologies negative for tumor cells  10. 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 Right neck pain-most likely secondary to a benign musculoskeletal condition, CT of the neck 06/06/2014 with degenerative changes, no evidence of metastatic disease  12. Renal failure-likely related to carcinomatosis and hepatic metastases, he saw nephrology 06/07/2014, improved  13. Nausea and vomiting following cycle 1 FOLFOX 14. Neutropenia following cycle one FOLFOX, Neulasta added with cycle 2. He declined further Neulasta. 15. 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 History of Hypokalemia-likely secondary to diarrhea 17. Pain-likely secondary to metastatic lymphadenopathy and carcinomatosis, or tumor flare following  pembrolizumab   Plan: Though patient complains of pain "7 out of 10" I do not find that he is receiving the PRN hydromorphone. RN tells me patient denied pain earlier in the AM. He may benefit from a low-dose fentanyl patch but at this point simply inquiring Q3h if patient's pain is controlled may be all that is required. He is already fairly somnolent.  He is currently full code. I did not address advanced directives with him but perhaps this can be clarified once his primary oncologist, Dr Benay Spice, returns tomorrow.  Greatly appreciate hospitalist service's help to this patient!   Chauncey Cruel, MD 10/16/2014  8:40 AM Medical Oncology and Hematology [facility] [address] [place], [place] [address] Tel. [phone]    Fax. [phone]

## 2014-10-17 ENCOUNTER — Inpatient Hospital Stay (HOSPITAL_COMMUNITY): Payer: Medicare Other

## 2014-10-17 DIAGNOSIS — R197 Diarrhea, unspecified: Secondary | ICD-10-CM

## 2014-10-17 DIAGNOSIS — D638 Anemia in other chronic diseases classified elsewhere: Secondary | ICD-10-CM

## 2014-10-17 DIAGNOSIS — R627 Adult failure to thrive: Secondary | ICD-10-CM

## 2014-10-17 LAB — CBC
HEMATOCRIT: 30.8 % — AB (ref 39.0–52.0)
Hemoglobin: 9.9 g/dL — ABNORMAL LOW (ref 13.0–17.0)
MCH: 29 pg (ref 26.0–34.0)
MCHC: 32.1 g/dL (ref 30.0–36.0)
MCV: 90.3 fL (ref 78.0–100.0)
Platelets: 276 10*3/uL (ref 150–400)
RBC: 3.41 MIL/uL — ABNORMAL LOW (ref 4.22–5.81)
RDW: 23.5 % — AB (ref 11.5–15.5)
WBC: 24.2 10*3/uL — ABNORMAL HIGH (ref 4.0–10.5)

## 2014-10-17 LAB — COMPREHENSIVE METABOLIC PANEL
ALT: 21 U/L (ref 0–53)
AST: 31 U/L (ref 0–37)
Albumin: 1.8 g/dL — ABNORMAL LOW (ref 3.5–5.2)
Alkaline Phosphatase: 439 U/L — ABNORMAL HIGH (ref 39–117)
Anion gap: 5 (ref 5–15)
BUN: 16 mg/dL (ref 6–23)
CO2: 27 mmol/L (ref 19–32)
Calcium: 8 mg/dL — ABNORMAL LOW (ref 8.4–10.5)
Chloride: 104 mmol/L (ref 96–112)
Creatinine, Ser: 0.46 mg/dL — ABNORMAL LOW (ref 0.50–1.35)
GFR calc non Af Amer: >90 mL/min (ref >90)
Glucose, Bld: 221 mg/dL — ABNORMAL HIGH (ref 70–99)
POTASSIUM: 3.1 mmol/L — AB (ref 3.5–5.1)
Sodium: 136 mmol/L (ref 135–145)
Total Bilirubin: 0.8 mg/dL (ref 0.3–1.2)
Total Protein: 5.9 g/dL — ABNORMAL LOW (ref 6.0–8.3)

## 2014-10-17 LAB — DIFFERENTIAL
Basophils Absolute: 0 10*3/uL (ref 0.0–0.1)
Basophils Relative: 0 % (ref 0–1)
EOS ABS: 0 10*3/uL (ref 0.0–0.7)
Eosinophils Relative: 0 % (ref 0–5)
LYMPHS ABS: 0.7 10*3/uL (ref 0.7–4.0)
LYMPHS PCT: 3 % — AB (ref 12–46)
MONO ABS: 0.7 10*3/uL (ref 0.1–1.0)
Monocytes Relative: 3 % (ref 3–12)
NEUTROS ABS: 22.8 10*3/uL — AB (ref 1.7–7.7)
Neutrophils Relative %: 94 % — ABNORMAL HIGH (ref 43–77)

## 2014-10-17 LAB — GLUCOSE, CAPILLARY
GLUCOSE-CAPILLARY: 195 mg/dL — AB (ref 70–99)
Glucose-Capillary: 161 mg/dL — ABNORMAL HIGH (ref 70–99)
Glucose-Capillary: 188 mg/dL — ABNORMAL HIGH (ref 70–99)
Glucose-Capillary: 196 mg/dL — ABNORMAL HIGH (ref 70–99)
Glucose-Capillary: 199 mg/dL — ABNORMAL HIGH (ref 70–99)
Glucose-Capillary: 202 mg/dL — ABNORMAL HIGH (ref 70–99)

## 2014-10-17 LAB — MAGNESIUM: Magnesium: 1.7 mg/dL (ref 1.5–2.5)

## 2014-10-17 LAB — PREALBUMIN: PREALBUMIN: 12.6 mg/dL — AB (ref 17.0–34.0)

## 2014-10-17 LAB — PHOSPHORUS: PHOSPHORUS: 2.7 mg/dL (ref 2.3–4.6)

## 2014-10-17 LAB — TRIGLYCERIDES: Triglycerides: 192 mg/dL — ABNORMAL HIGH (ref <150)

## 2014-10-17 LAB — TSH: TSH: 1.702 u[IU]/mL (ref 0.350–4.500)

## 2014-10-17 MED ORDER — METHYLPREDNISOLONE SODIUM SUCC 125 MG IJ SOLR
80.0000 mg | Freq: Two times a day (BID) | INTRAMUSCULAR | Status: DC
  Filled 2014-10-17 (×2): qty 1.28

## 2014-10-17 MED ORDER — OXYCODONE HCL 20 MG/ML PO CONC
10.0000 mg | ORAL | Status: DC | PRN
  Administered 2014-10-18 – 2014-10-19 (×2): 10 mg via ORAL
  Filled 2014-10-17 (×3): qty 1

## 2014-10-17 MED ORDER — ENSURE COMPLETE PO LIQD
237.0000 mL | Freq: Two times a day (BID) | ORAL | Status: DC
  Administered 2014-10-17 – 2014-10-20 (×6): 237 mL via ORAL

## 2014-10-17 MED ORDER — MORPHINE SULFATE ER 15 MG PO TBCR
15.0000 mg | EXTENDED_RELEASE_TABLET | Freq: Two times a day (BID) | ORAL | Status: DC
  Administered 2014-10-17 – 2014-10-20 (×7): 15 mg via ORAL
  Filled 2014-10-17 (×7): qty 1

## 2014-10-17 MED ORDER — POTASSIUM CHLORIDE CRYS ER 20 MEQ PO TBCR
40.0000 meq | EXTENDED_RELEASE_TABLET | ORAL | Status: AC
  Administered 2014-10-17 (×2): 40 meq via ORAL
  Filled 2014-10-17 (×2): qty 2

## 2014-10-17 MED ORDER — MORPHINE SULFATE 2 MG/ML IJ SOLN
2.0000 mg | INTRAMUSCULAR | Status: DC | PRN
  Administered 2014-10-19 – 2014-10-20 (×2): 2 mg via INTRAVENOUS
  Filled 2014-10-17 (×2): qty 1

## 2014-10-17 MED ORDER — DRONABINOL 5 MG PO CAPS
5.0000 mg | ORAL_CAPSULE | Freq: Two times a day (BID) | ORAL | Status: DC
  Administered 2014-10-17 – 2014-10-20 (×8): 5 mg via ORAL
  Filled 2014-10-17 (×8): qty 1

## 2014-10-17 MED ORDER — OXYCODONE HCL 20 MG/ML PO CONC
10.0000 mg | ORAL | Status: DC | PRN
  Administered 2014-10-17 (×3): 10 mg via ORAL
  Filled 2014-10-17 (×3): qty 1

## 2014-10-17 MED ORDER — TRACE MINERALS CR-CU-F-FE-I-MN-MO-SE-ZN IV SOLN
INTRAVENOUS | Status: DC
  Filled 2014-10-17: qty 1440

## 2014-10-17 MED ORDER — FAT EMULSION 20 % IV EMUL
240.0000 mL | INTRAVENOUS | Status: DC
  Filled 2014-10-17: qty 250

## 2014-10-17 NOTE — Progress Notes (Addendum)
NUTRITION FOLLOW UP  Intervention:   -Advance diet as tolerated per MD -Ensure Complete BID providing 350 kcal and 13 g protein -RD to continue to monitor  Nutrition Dx:   Inadquate oral intake related to increased nutrient needs as evidenced by estimated needs; ongoing.  Goal:   Pt to meet >/= 90% of estimated needs; progressing.  Monitor:   Pt tolerance to TPN, weight trends, labs  Assessment:   Pt with metastatic small bowel cancer, completed 2 cycles of chemo. Worsening abdominal pain for past 2 days as well as diarrhea and nausea.   Pt states usual body weight is 220 lbs. Pt has 16% wt loss in past 6 months (significant for time frame). Pt wife reports he has not eaten anything in 10 days.   Severe malnutrition: 16% wt loss in past 6 months and moderate to severe depletion of fat and muscle mass.  TPN discontinued, pt now on full liquid diet. Will order nutrition supplements. RD to follow up tomorrow.  Height: Ht Readings from Last 1 Encounters:  10/14/14 5\' 11"  (1.803 m)    Weight Status:   Wt Readings from Last 1 Encounters:  10/16/14 143 lb 12.8 oz (65.227 kg)   Filed Weights   10/14/14 0512 10/16/14 0500  Weight: 145 lb 14.4 oz (66.18 kg) 143 lb 12.8 oz (65.227 kg)    Re-estimated needs:  Kcal: 2000-2200 kcals Protein: 95-110 g protein Fluid: >/= 2 L/day  Skin: Ecchymosis on both arms  Diet Order: .TPN (CLINIMIX-E) Adult Diet full liquid   Intake/Output Summary (Last 24 hours) at 10/17/14 1029 Last data filed at 10/17/14 0532  Gross per 24 hour  Intake    180 ml  Output   1001 ml  Net   -821 ml    Last BM: 2/21   Labs:   Recent Labs Lab 10/15/14 0434 10/16/14 0530 10/17/14 0500  NA 137 139 136  K 3.7 3.2* 3.1*  CL 107 108 104  CO2 23 23 27   BUN 18 17 16   CREATININE 0.77 0.61 0.46*  CALCIUM 7.6* 8.2* 8.0*  MG 1.8 1.8 1.7  PHOS 3.5 2.7 2.7  GLUCOSE 261* 189* 221*    CBG (last 3)   Recent Labs  10/16/14 2352 10/17/14 0425  10/17/14 0740  GLUCAP 225* 195* 196*    Scheduled Meds: . dronabinol  5 mg Oral BID AC  . enoxaparin (LOVENOX) injection  40 mg Subcutaneous QHS  . insulin aspart  0-9 Units Subcutaneous 6 times per day  . levothyroxine  25 mcg Intravenous Daily  . lidocaine-prilocaine   Topical Once  . morphine  15 mg Oral Q12H  . pantoprazole (PROTONIX) IV  40 mg Intravenous QHS    Continuous Infusions: . Marland KitchenTPN (CLINIMIX-E) Adult 60 mL/hr at 10/16/14 1715   And  . fat emulsion 240 mL (10/16/14 1715)    Elmer Picker MS Dietetic Intern Pager Number [phone]

## 2014-10-17 NOTE — Progress Notes (Signed)
[name] really has not changed much over the weekend. He is still quite weak. He is on TNA. He is not eating all that much. He is taking is some clear liquids.  He's had no diarrhea. He is on high-dose steroids. I will cut back on the steroids a little bit.  I think the real key for him is the fact that his prealbumin is 1.7. I think that he just is not going to have any reserve to be able to get better.  He is trying his best. He is a DO NOT RESUSCITATE. He definitely understands his poor prognosis.  I really believe that he is a hospice candidate. However, I would have to defer to his primary oncologist, Dr. Benay Spice, to discuss this with him. I think that he probably would be amenable to hospice.  I just don't see that he is going to benefit from further therapy. I know he is on immunotherapy with pembrolizumzb and that sometimes it can take several treatments before you see a response. I might sure that he really would be with [place] that long in the future.  He is having some pain. I will try him on some OxyFast elixir.  I'm not sure if further TNA would be beneficial for him. Now that the diarrhea has stopped, may be he can try to sustain himself with oral intake.   On his physical exam, his vital signs are all stable. Blood pressure is 152/78. Pulse is 63. His lungs are clear. Cardiac exam regular rate and rhythm. Abdomen is soft. Bowel sounds are decreased. There is no guarding or rebound tenderness. There is no obvious abdominal mass. Has the laparotomy scar. There is no palpable liver or spleen tip.  His labs are about the same. Potassium is down a little bit. Albumin is 1.8.  The diarrhea seems to have resolved. Whether or not he will able to take in much nutrition I think will be the question now. We might want to consider stopping the TNA.  I appreciate all the great care that he is getting.  Dean E.  2 Timothy  1:7

## 2014-10-17 NOTE — Progress Notes (Signed)
[place] OFFICE PROGRESS NOTE   Diagnosis: Small bowel carcinoma  INTERVAL HISTORY:   Dean Owens was admitted 10/13/2014 with severe abdominal pain. He continues to have pain, chiefly in the right abdomen. He reports partial relief of pain with the current narcotic regimen. He has intermittent diarrhea. Poor appetite.  Objective:  Vital signs in last 24 hours:  Blood pressure 152/78, pulse 63, temperature 98.3 F (36.8 C), temperature source Oral, resp. rate 20, height 5' 11" (1.803 m), weight 143 lb 12.8 oz (65.227 kg), SpO2 100 %.    HEENT: No thrush Resp: Lungs clear bilaterally Cardio: Regular rate and rhythm GI: Soft, no hepatomegaly, mild diffuse tenderness, no mass Vascular: Trace edema at the ankles and hands bilaterally Neuro: Lethargic, arousable, follows commands, oriented     Portacath/PICC-without erythema  Lab Results:  Lab Results  Component Value Date   WBC 24.2* 10/17/2014   HGB 9.9* 10/17/2014   HCT 30.8* 10/17/2014   MCV 90.3 10/17/2014   PLT 276 10/17/2014   NEUTROABS 22.8* 10/17/2014     Imaging:  Ct Abdomen Pelvis W Contrast  10/13/2014   CLINICAL DATA:  Mid abdominal pain and diarrhea beginning 10/12/2014. History of metastatic colon cancer  EXAM: CT ABDOMEN AND PELVIS WITH CONTRAST  TECHNIQUE: Multidetector CT imaging of the abdomen and pelvis was performed using the standard protocol following bolus administration of intravenous contrast.  CONTRAST:  25 mL OMNIPAQUE IOHEXOL 300 MG/ML SOLN, 100 mL OMNIPAQUE IOHEXOL 300 MG/ML SOLN  COMPARISON:  CT abdomen and pelvis 09/02/2014 and 05/26/2014.  FINDINGS: The patient has a new very small right pleural effusion. No left pleural effusion is present and no pericardial effusion is identified.  As on the prior examination, multiple mass lesions are seen in the liver consistent with metastatic disease. Index lesion in the right hepatic lobe which had measured 12.2 x 8.7 cm today measures  12.7 x 7.6 cm on image 24. Difference in measurement is likely due to lack of contrast on the prior examination. The spleen, adrenal glands, pancreas and kidneys are unremarkable.  There is a moderate volume of abdominal and pelvic ascites which is increased somewhat since the prior study. Extensive abdominal lymphadenopathy is again seen. Omental and mesenteric nodularity is again identified.  Index porta hepatis noted which had measured 3.2 x 4.9 cm today measures 4.0 x 4.8 cm on image 31.  Left periaortic node which had measured 2.9 x 2.5 cm today measures 2.9 x 2.8 cm on image 40.  Mass in the central mesentery anterior to the aorta which had measured 3.4 x 1.9 cm today measures 4.2 x 2.8 cm on image 52.  The patient is status post colectomy and Hartmann's pouch formation. There is no evidence of bowel obstruction.  No lytic or sclerotic bony lesion is identified.  IMPRESSION: Progression of metastatic disease in the abdomen and pelvis with increased lymphadenopathy identified. There has also been increase in abdominal and pelvic ascites which now appears moderate. Hepatic metastases are not notably change.  New small right pleural effusion.  Status post colectomy.   Electronically Signed   By: Inge Rise M.D.   On: 10/13/2014 19:32   Dg Chest Port 1 View  10/13/2014   CLINICAL DATA:  Per CC nurse-came in today with abdominal pain starting yesterday. Took 2 Percocet at 1330 today. Has had diarrhea, decreased PO intake, increasing abdominal pain for the past few days with increased fatigue. Receiving chemo infusions currently. Denies fevers, SOB. RR even/unlabored. Has  had acid reflux at home. No other c/c. Hx colon cancer, diabetic, ex-smoker  EXAM: PORTABLE CHEST - 1 VIEW  COMPARISON:  06/20/2006  FINDINGS: Cardiac silhouette normal in size and configuration. No mediastinal or hilar masses or evidence of adenopathy. Right anterior chest wall power Port-A-Cath extends through the right internal jugular  vein to have its tip in the lower superior vena cava.  Clear lungs.  No pleural effusion or pneumothorax.  Bony thorax is demineralized but grossly intact.  IMPRESSION: No acute cardiopulmonary disease.   Electronically Signed   By: Lajean Manes M.D.   On: 10/13/2014 20:13    Medications: I have reviewed the patient's current medications.  1.Adenocarcinoma of the small bowel, stage III (T3 N1), status post a partial small bowel resection 10/14/2013   The tumor returned microsatellite instability high with loss of MSH2 expression   MRI of the liver 05/28/2014 with multiple hepatic metastases and portal/retroperitoneal lymphadenopathy, ultrasound-guided biopsy of the liver 05/31/2014 confirmed metastatic small bowel carcinoma   Cycle 1 FOLFOX 06/08/2014  Cycle 2 FOLFOX with Neulasta support 06/29/2014  Cycle 3 FOLFOX 07/13/2014  Cycle 4 FOLFOX 08/03/2014  Cycle 5 FOLFOX 08/22/2014  Restaging CT 09/02/2014 revealed progression of abdominal lymphadenopathy and peritoneal carcinomatosis  Cycle 1 Pembrolizumab 09/13/2014  Cycle 2 Pembrolizumab 10/04/2014 2. Colon cancer X3, 1997 and 1999, status post an intra-abdominal colectomy with ileorectal anastomosis in May 1999  3. History of Iron deficiency anemia  4. Diabetes  5. Hypothyroidism  6. Hypertension  7. Family history of colon cancer-he has hereditary non-polyposis colon cancer syndrome  8. 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 Ascites-likely secondary to portal hypertension versus carcinomatosis, repeat peritoneal fluid cytologies negative for tumor cells  10. Portal vein occlusion-this appears to be  secondary to extrinsic compression by tumor  11. Right neck pain-most likely secondary to a benign musculoskeletal condition, CT of the neck 06/06/2014 with degenerative changes, no evidence of metastatic disease  12. Renal failure-likely related to carcinomatosis and hepatic metastases, he saw nephrology 06/07/2014, improved  13. Nausea and vomiting following cycle 1 FOLFOX 14. Neutropenia following cycle one FOLFOX, Neulasta added with cycle 2. He declined further Neulasta. 15. 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 History of Hypokalemia-likely secondary to diarrhea 17. 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 He has increased abdominal pain and failure to thrive. It is possible his symptoms are related to disease progression, but he may be experiencing toxicity from therapy. I reviewed the most recent abdomen CT in radiology today.  He has a poor performance status. I discussed the situation with him this morning. He will not be able to complete additional treatment if his performance status does not improve. His wife is not present this morning. I asked him if she could be here tomorrow morning.  The pain is most likely related to tumor in the right liver, but the gallbladder appears to be significantly distended compared to CTs from 2015. I will check a gallbladder ultrasound to look for evidence of cholecystitis.  Recommendations:   1. Gallbladder ultrasound 2. Continue narcotic  analgesics for pain, add long-acting narcotic 3. Check ammonia level, TSH, And cortisol level  4. Discontinue Solu-Medrol-I doubt the diarrhea is related to immune mediated colitis  Betsy Coder, MD  10/17/2014  9:10 AM

## 2014-10-17 NOTE — Progress Notes (Signed)
PARENTERAL NUTRITION CONSULT NOTE - Follow-up  Pharmacy Consult for TPN Indication: SB Ca, abd pain  No Known Allergies  Patient Measurements: Height: 5' 11"  (180.3 cm) Weight: 143 lb 12.8 oz (65.227 kg) IBW/kg (Calculated) : 75.3  Vital Signs: Temp: 98.3 F (36.8 C) (02/22 0531) Temp Source: Oral (02/22 0531) BP: 152/78 mmHg (02/22 0531) Pulse Rate: 63 (02/22 0531) Intake/Output from previous day: 02/21 0701 - 02/22 0700 In: 240 [P.O.:240] Out: 1001 [Urine:1000; Stool:1] Intake/Output from this shift:    Labs:  Recent Labs  10/15/14 0449 10/17/14 0500  WBC 13.6* 24.2*  HGB 7.8* 9.9*  HCT 24.5* 30.8*  PLT 252 276    Recent Labs  10/15/14 0434 10/15/14 0445 10/16/14 0530 10/17/14 0500  NA 137  --  139 136  K 3.7  --  3.2* 3.1*  CL 107  --  108 104  CO2 23  --  23 27  GLUCOSE 261*  --  189* 221*  BUN 18  --  17 16  CREATININE 0.77  --  0.61 0.46*  CALCIUM 7.6*  --  8.2* 8.0*  MG 1.8  --  1.8 1.7  PHOS 3.5  --  2.7 2.7  PROT 5.3*  --  6.0 5.9*  ALBUMIN 1.4*  --  1.7* 1.8*  AST 22  --  28 31  ALT 14  --  19 21  ALKPHOS 423*  --  427* 439*  BILITOT 1.0  --  0.7 0.8  PREALBUMIN <3.0*  --   --   --   TRIG  --  209*  --  192*  Corrected Ca: 9.1 Estimated Creatinine Clearance: 81.5 mL/min (by C-G formula based on Cr of 0.46).    Recent Labs  10/17/14 0425 10/17/14 0740 10/17/14 1209  GLUCAP 195* 196* 188*    Medical History: Past Medical History  Diagnosis Date  . Hypertension   . Type 2 diabetes mellitus   . Hypothyroidism   . History of colon cancer NO RECURRENCE    S/P COLECTOMY X3  LAST ONE 1998--  [place]  . ED (erectile dysfunction) of organic origin   . Right knee DJD   . Eczema   . OA (osteoarthritis)     LEFT KNEE  . Wears glasses   . History of iron deficiency anemia   . History of squamous cell carcinoma excision   . Accelerated hypertension 10/21/2013  . Type II diabetes mellitus 10/21/2013  . Unspecified  hypothyroidism 10/21/2013  . 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 Lynch syndrome   . History of nonmelanoma skin cancer   . Anemia   . Recurrent prostate carcinoma FOLLOWED BY DR Gaynelle Arabian    DX 2007  S/P RADIOACTIVE SEED IMPLANTS  . 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 Ileus     s/p op 3 days  . History of blood transfusion 09/2013    PRBC  . S/P radiation therapy 01/19/14-03/01/14    retroperitonela sarcoma/abdomen   Insulin Requirements: 9 units  Current Nutrition: FL diet  IVF: none ordered  Central access: PAC placed 06/03/14 TPN start date: 2/19  ASSESSMENT  HPI: complaint of 3 day hx of abd pain, nausea and diarrhea. Hx of SB carcinoma treated with Folfox x5 cycles, now Pembrolizumab, Cycle 2 received 2/9. Previous Colon Ca treated 1998, Prostate Ca, now SB adenocarcinoma, small bowel resection 10/14/13, with progression, liver mets.  Hx of DM, no DM meds PTA, on SoluMedrol 51m IV q8hr  Significant events:   Today:   Glucose - improved but still elevated, CBGs 188-225 after insulin added to TPN on 2/20  Electrolytes - K low, Phos WNL, other lytes ok  Renal - wnl  LFTs - Bili WNL, Alk PO4 elevated (liver mets)  TGs - 209 (2/20), 192 (2/22)  Prealbumin - 1.7 (2/19)  FLD - poor appetite per RN  NUTRITIONAL GOALS                                                                                             RD recs: Kcal: 2000-2200 kcals, Protein: 95-110 g protein, Fluid: >/= 2 L/day  Clinimix E 5/15 at a goal rate of 83 ml/hr + 20% fat emulsion at 10 ml/hr to provide: 100 g/day protein, 1912 Kcal/day.  PLAN                                                                                                                         At 1800 today: Continue Clinimix E 5/15 at 60 ml/hr.  Increase insulin to 15 units insulin/L (=21.6 units/24h) -watch CBGs  closely  20% fat emulsion at 10 ml/hr - watch TGs closely and adjust as warranted Plan to advance as tolerated to the goal rate.  Will not increase TPN rate today per discussion with [facility] MD as patient is edematous, do not want to increase fluid intake, and diet is being advanced to FLD TPN to contain standard multivitamins and trace elements Continue SSI q4h Kdur 420m PO q4h x 2 doses TPN lab panels on Mondays & Thursdays. Bmet, Mag, Phos in AM F/u daily  Thank you for the consult.  JeCurrie ParisPharmD, BCPS Pager: [phone]: [phone]/22/2016 1:15 PM

## 2014-10-17 NOTE — Progress Notes (Signed)
Patient ID: Dean Owens, male   DOB: 08/23/1946, 69 y.o.   MRN: [id number]  [facility] PROGRESS NOTE  HELEN CUFF TDD:[id number] DOB: 1946/06/20 DOA: 10/13/2014 PCP: Horton Finer, MD   Brief narrative:    68 y.o. 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   In the ED, CT abdomen and pelvis c/w progression of the tumor.   Assessment/Plan:    Adenocarcinoma of the small bowel, stage III (T3 N1), status post a partial small bowel resection 10/14/2013  - appreciate oncology team assistance - confirmed DNR status and wants [place] to focus on comfort care   Abdominal pain with nausea  - likely secondary to progression of the disease - better controlled with analgesia and antiemetics provided  - advanced diet to full, add Marinol to see if appetite better   Diarrhea - likely secondary to recent chemotherapy - resolved   Malnutrition - lost 45 pounds over the last 2 months. - TNA per pharmacy but pt not doing much better, per wife would prefer to stop   Hypokalemia - continue to supplement and repeat BMP in AM  Anemia secondary to chemo and malnutrition, chronic disease  - transfused 1 U PRBC 2/20 - CBC in AM  Hyperglycemia - steroid induced - place on SSI for now only   Leukocytosis - likely reactive and also secondary to steroids - CBC in AM  Hypothyroidism - continue synthroid   Acute functional quadriplegia - from rather aggressive nature of acute/chronic illness - still bed bound  - will try to get OOB to chair if possible   DVT prophylaxis - On Lovenox  Code Status: DNR Family Communication: plan of care discussed with the patient and wife, son at bedside  Disposition Plan: Requires continued hospitalization   IV access:  Peripheral  IV  Procedures and diagnostic studies:    Ct Abdomen Pelvis W Contrast 10/13/2014 Progression of metastatic disease in the abdomen and pelvis with increased lymphadenopathy identified. There has also been increase in abdominal and pelvic ascites which now appears moderate. Hepatic metastases are not notably change. New small right pleural effusion. Status post colectomy.   CXR 10/13/2014 No acute cardiopulmonary disease.   Medical Consultants:  Oncology   Other Consultants:  None  IAnti-Infectives:   None   Faye Ramsay, MD  [facility] Pager [phone]  If 7PM-7AM, please contact night-coverage [email or web address] Password [facility] 10/17/2014, 1:43 PM   LOS: 4 days   HPI/Subjective: No events overnight.   Objective: Filed Vitals:   10/16/14 0500 10/16/14 1335 10/16/14 2123 10/17/14 0531  BP:  130/73 174/83 152/78  Pulse:  81 70 63  Temp:  98.3 F (36.8 C) 98.2 F (36.8 C) 98.3 F (36.8 C)  TempSrc:  Oral Oral Oral  Resp:  20 20 20   Height:      Weight: 65.227 kg (143 lb 12.8 oz)     SpO2:  96% 98% 100%    Intake/Output Summary (Last 24 hours) at 10/17/14 1343 Last data filed at 10/17/14 0532  Gross per 24 hour  Intake     60 ml  Output   1001 ml  Net   -941 ml    Exam:   General:  Pt is frail and weak   Cardiovascular: Regular rate and rhythm, S1/S2, no murmurs, no rubs, no gallops  Respiratory: Clear to  auscultation bilaterally, no wheezing, no crackles, no rhonchi  Abdomen: Soft, non tender, slightly distended, bowel sounds present, no guarding  Extremities: pulses DP and PT palpable bilaterally  Neuro: Grossly nonfocal  Data Reviewed: Basic Metabolic Panel:  Recent Labs Lab 10/13/14 1804 10/14/14 0512 10/14/14 1100 10/15/14 0434 10/16/14 0530 10/17/14 0500  NA 138 138  --  137 139 136  K 3.5 3.7  --  3.7 3.2* 3.1*  CL 109 109  --  107 108 104  CO2 20 22  --  23 23 27   GLUCOSE 87 80  --  261* 189* 221*  BUN 16 17  --  18 17 16    CREATININE 0.75 0.78  --  0.77 0.61 0.46*  CALCIUM 7.5* 7.5*  --  7.6* 8.2* 8.0*  MG  --   --  1.7 1.8 1.8 1.7  PHOS  --   --  3.7 3.5 2.7 2.7   Liver Function Tests:  Recent Labs Lab 10/13/14 1804 10/14/14 0512 10/15/14 0434 10/16/14 0530 10/17/14 0500  AST 28 22 22 28 31   ALT 16 15 14 19 21   ALKPHOS 518* 479* 423* 427* 439*  BILITOT 1.6* 1.4* 1.0 0.7 0.8  PROT 5.9* 5.7* 5.3* 6.0 5.9*  ALBUMIN 1.6* 1.5* 1.4* 1.7* 1.8*    Recent Labs Lab 10/13/14 1804  LIPASE 14   No results for input(s): AMMONIA in the last 168 hours. CBC:  Recent Labs Lab 10/13/14 1804 10/14/14 0512 10/15/14 0449 10/17/14 0500  WBC 18.4* 20.4* 13.6* 24.2*  NEUTROABS 17.1* 18.0* 12.8* 22.8*  HGB 12.1* 8.7* 7.8* 9.9*  HCT 38.0* 27.7* 24.5* 30.8*  MCV 89.0 90.2 90.4 90.3  PLT 284 336 252 276   CBG:  Recent Labs Lab 10/16/14 2019 10/16/14 2352 10/17/14 0425 10/17/14 0740 10/17/14 1209  GLUCAP 197* 225* 195* 196* 188*    Recent Results (from the past 240 hour(s))  Clostridium Difficile by PCR     Status: None   Collection Time: 10/14/14  2:14 AM  Result Value Ref Range Status   C difficile by pcr NEGATIVE NEGATIVE Final    Comment: Performed at [facility]     Scheduled Meds: . dronabinol  5 mg Oral BID AC  . enoxaparin (LOVENOX) injection  40 mg Subcutaneous QHS  . insulin aspart  0-9 Units Subcutaneous 6 times per day  . levothyroxine  25 mcg Intravenous Daily  . lidocaine-prilocaine   Topical Once  . morphine  15 mg Oral Q12H  . pantoprazole (PROTONIX) IV  40 mg Intravenous QHS  . potassium chloride  40 mEq Oral Q4H   Continuous Infusions:

## 2014-10-18 LAB — GLUCOSE, CAPILLARY
GLUCOSE-CAPILLARY: 135 mg/dL — AB (ref 70–99)
Glucose-Capillary: 157 mg/dL — ABNORMAL HIGH (ref 70–99)

## 2014-10-18 LAB — BASIC METABOLIC PANEL
ANION GAP: 4 — AB (ref 5–15)
BUN: 14 mg/dL (ref 6–23)
CO2: 26 mmol/L (ref 19–32)
Calcium: 7.7 mg/dL — ABNORMAL LOW (ref 8.4–10.5)
Chloride: 103 mmol/L (ref 96–112)
Creatinine, Ser: 0.43 mg/dL — ABNORMAL LOW (ref 0.50–1.35)
Glucose, Bld: 155 mg/dL — ABNORMAL HIGH (ref 70–99)
POTASSIUM: 3.3 mmol/L — AB (ref 3.5–5.1)
SODIUM: 133 mmol/L — AB (ref 135–145)

## 2014-10-18 LAB — AMMONIA: AMMONIA: 58 umol/L — AB (ref 11–32)

## 2014-10-18 LAB — CORTISOL: Cortisol, Plasma: 7.4 ug/dL

## 2014-10-18 LAB — MAGNESIUM: Magnesium: 1.3 mg/dL — ABNORMAL LOW (ref 1.5–2.5)

## 2014-10-18 LAB — T4: T4, Total: 3 ug/dL — ABNORMAL LOW (ref 4.5–12.0)

## 2014-10-18 LAB — PHOSPHORUS: Phosphorus: 1.8 mg/dL — ABNORMAL LOW (ref 2.3–4.6)

## 2014-10-18 MED ORDER — MAGNESIUM SULFATE 2 GM/50ML IV SOLN
2.0000 g | Freq: Once | INTRAVENOUS | Status: AC
  Administered 2014-10-18: 2 g via INTRAVENOUS
  Filled 2014-10-18: qty 50

## 2014-10-18 MED ORDER — LIP MEDEX EX OINT
TOPICAL_OINTMENT | CUTANEOUS | Status: DC | PRN
  Administered 2014-10-18: 1 via TOPICAL
  Filled 2014-10-18: qty 7

## 2014-10-18 MED ORDER — POTASSIUM CHLORIDE CRYS ER 20 MEQ PO TBCR
40.0000 meq | EXTENDED_RELEASE_TABLET | Freq: Once | ORAL | Status: DC
  Filled 2014-10-18: qty 2

## 2014-10-18 MED ORDER — LACTULOSE 10 GM/15ML PO SOLN
10.0000 g | Freq: Two times a day (BID) | ORAL | Status: DC
  Administered 2014-10-18 – 2014-10-20 (×4): 10 g via ORAL
  Filled 2014-10-18 (×6): qty 15

## 2014-10-18 MED ORDER — K PHOS MONO-SOD PHOS DI & MONO 155-852-130 MG PO TABS
250.0000 mg | ORAL_TABLET | Freq: Once | ORAL | Status: AC
  Administered 2014-10-18: 250 mg via ORAL
  Filled 2014-10-18: qty 1

## 2014-10-18 NOTE — Progress Notes (Addendum)
Patient ID: Dean Owens, male   DOB: [birth year], 69 y.o.   MRN: [id number]  [facility] PROGRESS NOTE  Dean Owens JHE:[id number] DOB: July 31, 1946 DOA: 10/13/2014 PCP: Horton Finer, MD   Brief narrative:    69 y.o. 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   In the ED, CT abdomen and pelvis c/w progression of the tumor. Hospital stay prolonged by persistent electrolyte abnormalities and poor oral intake, poor response to TPN (stopped since 2/22), confusion and poor ambulatory status.   Assessment/Plan:    Adenocarcinoma of the small bowel, stage III (T3 N1), status post a partial small bowel resection 10/14/2013  - appreciate oncology team assistance - per Dr. Benay Spice, pt will not be candidate for further systemic therapy unless performance status improves - per Dr. Benay Spice, not clear if pt's sudden clinical decline is from the cancer itself vs chemotx - future chemotx is not entirely excluded if pt gets better physically  - will ask for PT/OT evaluation to determine most appropriate d/c plan   Abdominal pain with nausea  - also unclear if secondary to progression of the disease vs chemo tx - better controlled with analgesia and antiemetics provided  - advanced diet to full, added Marinol to see if appetite better  - off TPN since 2/22  Acute encephalopathy - better since admission and per Dr. Benay Spice possibly from pituitary toxicity from the pembrolizumab vs hepatic encephalopathy - progression of metastatic colon cancer also possible underlying etiology  - TSH WNL but T4 is low and per Dr Benay Spice, possibly from chemo itself  - placed on lactulose and also per Dr. Benay Spice will check cortisol level in AM, may need hydrocortisone replacement if cortisol low - check  ammonia level as well in AM  Diarrhea - likely secondary to recent chemotherapy - resolved but can recur as will start lactulose today   Severe PCM, in the context of chronic illness  - lost 45 pounds over the last 2 months. 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  DVT prophylaxis - On Lovenox SQ  Code Status: DNR Family Communication: plan of care discussed with patient and wife, son and daughter at bedside  Disposition Plan: D/C once cleared by oncology, was told by SW pt does not meet criteria for inpatient stay, will need to d/w Dr. Benay Spice in AM if he is OK with pt being d/c home or ? Residential hospice vs SNF. D/W Dr.  Sherrill, pt is not ready for D/C at this time.   IV access:  Peripheral IV  Procedures and diagnostic studies:     Ct Abdomen Pelvis W Contrast 10/13/2014 Progression of metastatic disease in the abdomen and pelvis with increased lymphadenopathy identified. There has also been increase in abdominal and pelvic ascites which now appears moderate. Hepatic metastases are not notably change. New small right pleural effusion. Status post colectomy.   CXR  10/13/2014 No acute cardiopulmonary disease.   Medical Consultants:  Oncology   Other Consultants:  PT/OT Nutritionist   IAnti-Infectives:   None  Faye Ramsay, MD  [facility] Pager [phone]  If 7PM-7AM, please contact night-coverage [email or web address] Password [facility]: Lambert Rhonda W 10/18/2014, 5:24 PM   LOS: 5 days   HPI/Subjective: No events overnight.   Objective: Filed Vitals:   10/17/14 2020 10/18/14 0155 10/18/14 0500 10/18/14 1409  BP: 140/68  115/64 99/69  Pulse: 77  94 73  Temp: 98.1 F (36.7 C)  97.5 F (36.4 C) 97.9 F (36.6 C)  TempSrc: Oral  Oral Oral  Resp: 20  20 16   Height:      Weight:  58.287 kg (128 lb 8 oz)    SpO2: 100%  99% 100%    Intake/Output Summary (Last 24 hours) at 10/18/14 1724 Last data filed at 10/18/14 0935  Gross per 24 hour  Intake   2160 ml  Output   1000 ml  Net   1160 ml    Exam:   General:  Pt is alert, follows commands appropriately, not in acute distress, looks better this AM but very weak and frail   Cardiovascular: Regular rate and rhythm,  no rubs, no gallops  Respiratory: Clear to auscultation bilaterally, no wheezing, no crackles, no rhonchi  Abdomen: Soft, non tender, slightly distended, bowel sounds present, no guarding  Extremities: bilateral upper ext swelling, pulses DP and PT palpable bilaterally  Neuro: Grossly nonfocal  Data Reviewed: Basic Metabolic Panel:  Recent Labs Lab 10/14/14 0512 10/14/14 1100 10/15/14 0434 10/16/14 0530 10/17/14 0500 10/18/14 0458  NA 138  --  137 139 136 133*  K 3.7  --  3.7 3.2* 3.1* 3.3*  CL 109  --  107 108 104 103  CO2 22  --  23 23 27 26   GLUCOSE 80  --  261* 189* 221* 155*  BUN 17  --  18 17 16 14   CREATININE 0.78  --  0.77 0.61 0.46* 0.43*  CALCIUM 7.5*  --  7.6* 8.2* 8.0* 7.7*  MG  --  1.7 1.8 1.8 1.7 1.3*  PHOS  --  3.7 3.5 2.7 2.7 1.8*   Liver Function Tests:  Recent Labs Lab 10/13/14 1804 10/14/14 0512 10/15/14 0434 10/16/14 0530 10/17/14 0500  AST 28  22 22 28 31   ALT 16 15 14 19 21   ALKPHOS 518* 479* 423* 427* 439*  BILITOT 1.6* 1.4* 1.0 0.7 0.8  PROT 5.9* 5.7* 5.3* 6.0 5.9*  ALBUMIN 1.6* 1.5* 1.4* 1.7* 1.8*    Recent Labs Lab 10/13/14 1804  LIPASE 14    Recent Labs Lab 10/18/14 0458  AMMONIA 58*   CBC:  Recent Labs Lab 10/13/14 1804 10/14/14 0512 10/15/14 0449 10/17/14 0500  WBC 18.4* 20.4* 13.6* 24.2*  NEUTROABS 17.1* 18.0* 12.8* 22.8*  HGB 12.1* 8.7* 7.8* 9.9*  HCT 38.0* 27.7* 24.5* 30.8*  MCV 89.0 90.2 90.4 90.3  PLT 284 336 252 276   CBG:  Recent Labs Lab 10/17/14 1633 10/17/14 2018 10/17/14 2346 10/18/14 0457  10/18/14 0745  GLUCAP 202* 199* 161* 157* 135*    Recent Results (from the past 240 hour(s))  Clostridium Difficile by PCR     Status: None   Collection Time: 10/14/14  2:14 AM  Result Value Ref Range Status   C difficile by pcr NEGATIVE NEGATIVE Final    Comment: Performed at [facility]     Scheduled Meds: . dronabinol  5 mg Oral BID AC  . enoxaparin (LOVENOX) injection  40 mg Subcutaneous QHS  . feeding supplement (ENSURE COMPLETE)  237 mL Oral BID BM  . lactulose  10 g Oral BID  . levothyroxine  25 mcg Intravenous Daily  . morphine  15 mg Oral Q12H  . pantoprazole (PROTONIX) IV  40 mg Intravenous QHS   Continuous Infusions:

## 2014-10-18 NOTE — Plan of Care (Signed)
Problem: Phase III Progression Outcomes Goal: Pain controlled on oral analgesia Outcome: Progressing Started on MS Contin. Prn oxyfast. Patient gives a "thumbs up" when asked about his pain. Rating it 7/10 but smiling.

## 2014-10-18 NOTE — Plan of Care (Signed)
Problem: Phase II Progression Outcomes Goal: IV changed to normal saline lock Outcome: Progressing Discontinued TNA per order.

## 2014-10-18 NOTE — Progress Notes (Addendum)
Paged NP Dean Owens about low K+, phos+ and Mg+ with am labs. Orders received for replacements of Mg sulfate and K phos tablets. Ammonia also 58 this am but no new orders about that yet.

## 2014-10-18 NOTE — Progress Notes (Signed)
[place] OFFICE PROGRESS NOTE   Diagnosis: Small bowel carcinoma  INTERVAL HISTORY:   He reports improvement in the abdominal pain. No diarrhea.  Objective:  Vital signs in last 24 hours:  Blood pressure 115/64, pulse 94, temperature 97.5 F (36.4 C), temperature source Oral, resp. rate 20, height 5' 11"  (1.803 m), weight 128 lb 8 oz (58.287 kg), SpO2 99 %.    HEENT: No thrush Resp: Lungs anteriorly Cardio: Regular rate and rhythm GI: Soft, no hepatomegaly, nontender Vascular: Trace edema at the hands bilaterally Neuro: More alert, follows commands    Portacath/PICC-without erythema  Lab Results:  Lab Results  Component Value Date   WBC 24.2* 10/17/2014   HGB 9.9* 10/17/2014   HCT 30.8* 10/17/2014   MCV 90.3 10/17/2014   PLT 276 10/17/2014   NEUTROABS 22.8* 10/17/2014     Imaging:  US Abdomen Limited  10/17/2014   CLINICAL DATA:  Abdominal pain and diarrhea starting 5 days ago. Right upper quadrant pain for 7 months. History of squamous cell carcinoma and colon carcinoma as well as hypertension and diabetes. Known metastatic disease based on the prior CT scan report.  EXAM: US ABDOMEN LIMITED - RIGHT UPPER QUADRANT  COMPARISON:  CT, 10/13/2014.  FINDINGS: Gallbladder:  Sludge. No shadowing stones. No wall thickening. No evidence of acute cholecystitis.  Common bile duct:  Diameter: 3.2 mm.  Liver:  Right liver mass measuring 6.4 cm x 5.2 cm x 5.6 cm. No other liver masses. 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  Ascites. There is a midline mass measuring 4.6 cm reflecting the confluent adenopathy noted on the recent CT.  IMPRESSION: 1. No acute findings. No evidence of acute cholecystitis. There is gallbladder sludge but no shadowing stones. 2. Metastatic disease to the liver with a 6.4 cm right liver mass. Metastatic adenopathy. A midline mobile mass measuring 4.6 cm is noted. Ascites. These findings are stable from the recent CT.    Electronically Signed   By: Lajean Manes M.D.   On: 10/17/2014 16:18    Medications: I have reviewed the patient's current medications.  1.Adenocarcinoma of the small bowel, stage III (T3 N1), status post a partial small bowel resection 10/14/2013   The tumor returned microsatellite instability high with loss of MSH2 expression   MRI of the liver 05/28/2014 with multiple hepatic metastases and portal/retroperitoneal lymphadenopathy, ultrasound-guided biopsy of the liver 05/31/2014 confirmed metastatic small bowel carcinoma   Cycle 1 FOLFOX 06/08/2014  Cycle 2 FOLFOX with Neulasta support 06/29/2014  Cycle 3 FOLFOX 07/13/2014  Cycle 4 FOLFOX 08/03/2014  Cycle 5 FOLFOX 08/22/2014  Restaging CT 09/02/2014 revealed progression of abdominal lymphadenopathy and peritoneal carcinomatosis  Cycle 1 Pembrolizumab 09/13/2014  Cycle 2 Pembrolizumab 10/04/2014 2. Colon cancer X3, 1997 and 1999, status post an intra-abdominal colectomy with ileorectal anastomosis in May 1999  3. History of Iron deficiency anemia  4. Diabetes  5. 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 Hypertension  7. Family history of colon cancer-he has hereditary non-polyposis colon cancer syndrome  8. 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 Ascites-likely secondary to portal hypertension versus carcinomatosis, repeat peritoneal fluid cytologies negative for tumor cells  10. 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 Right neck pain-most likely secondary to a benign musculoskeletal condition, CT of the neck 06/06/2014 with degenerative  changes, no evidence of metastatic disease  12. Renal failure-likely related to carcinomatosis and hepatic metastases, he saw nephrology 06/07/2014, improved  13. Nausea and vomiting following cycle 1 FOLFOX 14. Neutropenia following cycle one FOLFOX, Neulasta added with cycle 2. He declined further Neulasta. 15. 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 History of Hypokalemia-likely secondary to diarrhea 17. Pain-likely secondary to metastatic lymphadenopathy and carcinomatosis, or tumor flare following pembrolizumab  Assessment/Plan:  Dean Owens remains weak and somewhat lethargic. It is possible he is experiencing pituitary toxicity from the pembrolizumab. He may also be symptomatic with hepatic encephalopathy. It is also possible he has progression of metastatic colon cancer.  I had a long discussion with his family regarding his current presentation and prognosis. He will not be a candidate for further systemic therapy unless his performance status improves. We discussed hospice care.  Recommendations:   1. Switch to oral thyroid hormone replacement and monitor thyroid hormone level 2. Continue narcotic analgesics for pain 3. Begin trial of lactulose 4. Repeat cortisol level in the a.m. 10/19/2014 and begin hydrocortisone replacement as indicated 5. Increase diet and encouraged ambulation Dean Coder, MD  10/18/2014  2:01 PM

## 2014-10-19 DIAGNOSIS — E038 Other specified hypothyroidism: Secondary | ICD-10-CM

## 2014-10-19 DIAGNOSIS — E43 Unspecified severe protein-calorie malnutrition: Secondary | ICD-10-CM

## 2014-10-19 DIAGNOSIS — R531 Weakness: Secondary | ICD-10-CM

## 2014-10-19 LAB — BASIC METABOLIC PANEL
Anion gap: 5 (ref 5–15)
BUN: 19 mg/dL (ref 6–23)
CALCIUM: 7.6 mg/dL — AB (ref 8.4–10.5)
CO2: 26 mmol/L (ref 19–32)
CREATININE: 0.51 mg/dL (ref 0.50–1.35)
Chloride: 103 mmol/L (ref 96–112)
GFR calc Af Amer: >90 mL/min (ref >90)
GLUCOSE: 111 mg/dL — AB (ref 70–99)
POTASSIUM: 4 mmol/L (ref 3.5–5.1)
Sodium: 134 mmol/L — ABNORMAL LOW (ref 135–145)

## 2014-10-19 LAB — PHOSPHORUS: Phosphorus: 1.6 mg/dL — ABNORMAL LOW (ref 2.3–4.6)

## 2014-10-19 LAB — AMMONIA: Ammonia: 44 umol/L — ABNORMAL HIGH (ref 11–32)

## 2014-10-19 LAB — T4: T4 TOTAL: 15.8 ug/dL — AB (ref 4.5–12.0)

## 2014-10-19 LAB — MAGNESIUM: MAGNESIUM: 1.8 mg/dL (ref 1.5–2.5)

## 2014-10-19 MED ORDER — PANTOPRAZOLE SODIUM 40 MG PO TBEC
40.0000 mg | DELAYED_RELEASE_TABLET | Freq: Every day | ORAL | Status: DC
  Administered 2014-10-19 – 2014-10-20 (×2): 40 mg via ORAL
  Filled 2014-10-19 (×2): qty 1

## 2014-10-19 MED ORDER — LEVOTHYROXINE SODIUM 50 MCG PO TABS
50.0000 ug | ORAL_TABLET | Freq: Every day | ORAL | Status: DC
  Administered 2014-10-19 – 2014-10-20 (×2): 50 ug via ORAL
  Filled 2014-10-19 (×3): qty 1

## 2014-10-19 NOTE — Evaluation (Signed)
Occupational Therapy Evaluation Patient Details Name: Dean Owens MRN: [id number] DOB: [birth year] Today's Date: 10/19/2014    History of Present Illness     Clinical Impression   This 69 year old man was admitted with abdominal pain, nausea and  diarrhea. He has a h/o small bowel carcinoma and was on chemotherapy.  Found to have progression of tumor.  Pt was mod I prior to admission.  He now needs mod to max  A x 2 for safety with transfers and A x 2 for ambulation.  Pt is slow to initiate and had decreased STM during evaluation.  Will follow in acute with min guard level goals.    Follow Up Recommendations  SNF;Home health OT;Supervision/Assistance - 24 hour (vs.  Pt would benefit from SNF if family is agreeable)    Equipment Recommendations  3 in 1 bedside comode (? if pt has this with bucket; if home hospital bed also)    Recommendations for Other Services       Precautions / Restrictions Precautions Precautions: Fall Restrictions Weight Bearing Restrictions: No      Mobility Bed Mobility Overal bed mobility: Needs Assistance Bed Mobility: Supine to Sit;Sit to Supine     Supine to sit: Mod assist;HOB elevated Sit to supine: Mod assist   General bed mobility comments: much extra time to initiate activity, patient raised his HOB to assist  to get  upright,  lifting assist of trunk into upright,  assist for legs onto bed.  Transfers Overall transfer level: Needs assistance Equipment used: Rolling walker (2 wheeled) Transfers: Sit to/from Stand Sit to Stand: Mod assist;+2 safety/equipment (from high bed)  Max A x 2 from comfort height commode         General transfer comment:  power up assist from bed and more mod /max assist from low surface/toilet. Extra time for redirection/initiation. Cues for hand placement on RW.    Balance Overall balance assessment: History of Falls;Needs assistance         Standing balance support: During functional  activity;Bilateral upper extremity supported Standing balance-Leahy Scale: Poor                              ADL Overall ADL's : Needs assistance/impaired     Grooming: Wash/dry hands;Set up;Sitting                   Toilet Transfer: Minimal assistance;Ambulation;+2 for physical assistance (mod A to get up from low surface)   Toileting- Clothing Manipulation and Hygiene: Supervision/safety;Sitting/lateral lean         General ADL Comments: Pt asked to use commode at beginning of session.  Pt is very slow to initiate activities and requires a lot of rest breaks:  did not assess LB adls.  Will further assess on next visit.  Pt is able to perform UB adls with set up/supervision for thoroughness.       Vision     Perception     Praxis      Pertinent Vitals/Pain Pain Assessment: No/denies pain     Hand Dominance     Extremity/Trunk Assessment Upper Extremity Assessment Upper Extremity Assessment: Overall WFL for tasks assessed   Lower Extremity Assessment Lower Extremity Assessment: Generalized weakness       Communication Communication Communication: No difficulties   Cognition Arousal/Alertness: Awake/alert Behavior During Therapy: Restless;Agitated Overall Cognitive Status: Impaired/Different from baseline Area of Impairment: Memory;Following commands;Attention;Safety/judgement;Awareness;Problem solving  Current Attention Level: Selective Memory: Decreased short-term memory;Decreased recall of precautions Following Commands: Follows one step commands inconsistently Safety/Judgement: Decreased awareness of deficits;Decreased awareness of safety   Problem Solving: Slow processing;Decreased initiation;Difficulty sequencing;Requires verbal cues General Comments: pt asked plan several times:  had asked to use commode and forgot.  Slow to initiate   General Comments       Exercises       Shoulder Instructions      Home Living  Family/patient expects to be discharged to:: Private residence Living Arrangements: Spouse/significant other Available Help at Discharge: Family Type of Home: House Home Access: Stairs to enter Technical brewer of Steps: 3-4 Entrance Stairs-Rails: None Home Layout: Two level;Bed/bath upstairs;1/2 bath on main level Alternate Level Stairs-Number of Steps: 14  Alternate Level Stairs-Rails: Right           Home Equipment: Walker - 2 wheels;Cane - single point;Toilet riser   Additional Comments: tub/shower.      Prior Functioning/Environment Level of Independence: Needs assistance  Gait / Transfers Assistance Needed: recently required assistanc e for ambulation ADL's / Homemaking Assistance Needed: indpendent until recent decline        OT Diagnosis: Generalized weakness   OT Problem List: Decreased strength;Decreased activity tolerance;Impaired balance (sitting and/or standing);Decreased cognition;Decreased knowledge of use of DME or AE   OT Treatment/Interventions: Self-care/ADL training;DME and/or AE instruction;Patient/family education;Balance training;Cognitive remediation/compensation    OT Goals(Current goals can be found in the care plan section) Acute Rehab OT Goals Patient Stated Goal: be able to get up to bedroom on second floor OT Goal Formulation: With patient/family Time For Goal Achievement: 11/02/14 Potential to Achieve Goals: Good ADL Goals Pt Will Perform Grooming: with min guard assist;standing Pt Will Perform Lower Body Bathing: with min guard assist;sit to/from stand Pt Will Perform Lower Body Dressing: with min guard assist;sit to/from stand Pt Will Transfer to Toilet: with min guard assist;ambulating;bedside commode Additional ADL Goal #1: pt will initiate activities within 20 seconds with initial cue and follow up cue after 15 seconds  OT Frequency: Min 2X/week   Barriers to D/C:            Co-evaluation PT/OT/SLP Co-Evaluation/Treatment:  Yes Reason for Co-Treatment: For patient/therapist safety PT goals addressed during session: Mobility/safety with mobility OT goals addressed during session: ADL's and self-care      End of Session    Activity Tolerance: Patient limited by fatigue Patient left: in bed;with call bell/phone within reach;with family/visitor present   Time: 9629-5284 OT Time Calculation (min): 31 min Charges:  OT General Charges $OT Visit: 1 Procedure OT Evaluation $Initial OT Evaluation Tier I: 1 Procedure G-Codes:    Sharvi Mooneyhan 2014/11/09, 2:45 PM   Lesle Chris, OTR/L [phone] 2014/11/09

## 2014-10-19 NOTE — Progress Notes (Signed)
PHARMACIST - PHYSICIAN COMMUNICATION DR:   [place] CONCERNING: Proton Pump Inhibitor IV to Oral Route Change Policy  The patient is receiving Protonix by the intravenous route. Based on criteria approved by the Pharmacy and [place], the medication is being converted to the equivalent oral dose form.   These criteria include:  -No Active GI bleeding  -Able to tolerate diet of full liquids (or better) or tube feeding  -Able to tolerate other medications by the oral or enteral route   If you have any questions about this conversion, please contact the Pharmacy Department (ext [phone]). Thank you.  Peggyann Juba, PharmD, BCPS 10/19/2014 9:52 AM

## 2014-10-19 NOTE — Progress Notes (Signed)
NUTRITION FOLLOW UP  Intervention:   -Continue Ensure Complete BID providing 350 kcal and 13 g protein -Magic cup TID with meals providing 290 kcal and 9 g protein  Nutrition Dx:   Inadquate oral intake related to increased nutrient needs as evidenced by estimated needs; ongoing.  Goal:   Pt to meet >/= 90% of estimated needs; not met.  Monitor:   Pt tolerance to TPN, weight trends, labs  Assessment:   Pt with metastatic small bowel cancer, completed 2 cycles of chemo. Worsening abdominal pain for past 2 days as well as diarrhea and nausea.   Pt states usual body weight is 220 lbs. Pt has 16% wt loss in past 6 months (significant for time frame). Pt wife reports he has not eaten anything in 10 days.   Severe malnutrition: 16% wt loss in past 6 months and moderate to severe depletion of fat and muscle mass.  2/24 TPN discontinued on 2/22, pt now on regular diet. Receiving Ensure Complete BID.  Patient reports he doesn't like them but drinks them anyway.  Pt would like to try Magic Cup.  Pt eating about 20% of meals.  Brother in law reported he ate a muffin at dinner time last night and had some coffee cake a breakfast.  Pt was currently eating lunch tray but had only had a few bites at time of interview.     Height: Ht Readings from Last 1 Encounters:  10/14/14 _0  (1.803 m)    Weight Status:   Wt Readings from Last 1 Encounters:  10/18/14 128 lb 8 oz (58.287 kg)   Filed Weights   10/14/14 0512 10/16/14 0500 10/18/14 0155  Weight: 145 lb 14.4 oz (66.18 kg) 143 lb 12.8 oz (65.227 kg) 128 lb 8 oz (58.287 kg)    Re-estimated needs:  Kcal: 2000-2200 kcals Protein: 95-110 g protein Fluid: >/= 2 L/day  Skin: Ecchymosis on both arms  Diet Order: Diet regular   Intake/Output Summary (Last 24 hours) at 10/19/14 1405 Last data filed at 10/18/14 1409  Gross per 24 hour  Intake     60 ml  Output      0 ml  Net     60 ml    Last BM: 2/21   Labs:   Recent  Labs Lab 10/17/14 0500 10/18/14 0458 10/19/14 0530  NA 136 133* 134*  K 3.1* 3.3* 4.0  CL 104 103 103  CO2 _1 BUN _2 CREATININE 0.46* 0.43* 0.51  CALCIUM 8.0* 7.7* 7.6*  MG 1.7 1.3* 1.8  PHOS 2.7 1.8* 1.6*  GLUCOSE 221* 155* 111*    CBG (last 3)   Recent Labs  10/17/14 2346 10/18/14 0457 10/18/14 0745  GLUCAP 161* 157* 135*    Scheduled Meds: . dronabinol  5 mg Oral BID AC  . enoxaparin (LOVENOX) injection  40 mg Subcutaneous QHS  . feeding supplement (ENSURE COMPLETE)  237 mL Oral BID BM  . lactulose  10 g Oral BID  . levothyroxine  50 mcg Oral QAC breakfast  . morphine  15 mg Oral Q12H  . pantoprazole  40 mg Oral Daily  . potassium chloride  40 mEq Oral Once    Continuous Infusions:    Elmer Picker MS Dietetic Intern Pager Number [phone]

## 2014-10-19 NOTE — Evaluation (Signed)
Physical Therapy Evaluation Patient Details Name: Dean Owens MRN: [id number] DOB: [birth year] Today's Date: 10/19/2014   History of Present Illness  Dean Owens is a 69 y.o. 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  CT abdomen and pelvis which shows progression of the tumor  Clinical Impression  Patient requiring 2 person assist for  Out of bed and ambulation, which is  Very limited, very weak, fatigues easily. Patient will benefit from PT while in acute care to address problems listed in note below. Patient has bedroom/bath on second level,  Wife is caregiver. Will need  Additional  Caregivers or consider SNF.    Follow Up Recommendations SNF;Supervision/Assistance - 24 hour (patient currently requires 2 person assist for safety.)    Equipment Recommendations  Hospital bed;3in1 (PT) (if DC home)    Recommendations for Other Services       Precautions / Restrictions Precautions Precautions: Fall Restrictions Weight Bearing Restrictions: No      Mobility  Bed Mobility Overal bed mobility: Needs Assistance Bed Mobility: Supine to Sit;Sit to Supine     Supine to sit: Mod assist;HOB elevated Sit to supine: Mod assist   General bed mobility comments: much extra time to initiate activity, patient raised his HOB to assist  to get  upright,  lifting assist of trunk into upright,  assist fro legs onto bed.  Transfers Overall transfer level: Needs assistance Equipment used: Rolling walker (2 wheeled) Transfers: Sit to/from Stand Sit to Stand: Mod assist;+2 safety/equipment         General transfer comment:  power up assist from bed and more mod /max assist from low surface/toilet. Extra time for redirection/initiation. Cues for hand placement on RW.  Ambulation/Gait Ambulation/Gait assistance: Mod assist;+2  safety/equipment Ambulation Distance (Feet): 12 Feet (x 2) Assistive device: Rolling walker (2 wheeled) Gait Pattern/deviations: Step-to pattern;Step-through pattern;Staggering left;Staggering right;Shuffle Gait velocity: slow   General Gait Details: cues for safety, hand placement maintained on RW  Stairs            Wheelchair Mobility    Modified Rankin (Stroke Patients Only)       Balance Overall balance assessment: History of Falls;Needs assistance         Standing balance support: During functional activity;Bilateral upper extremity supported Standing balance-Leahy Scale: Poor                               Pertinent Vitals/Pain Pain Assessment: No/denies pain    Home Living Family/patient expects to be discharged to:: Private residence Living Arrangements: Spouse/significant other Available Help at Discharge: Family Type of Home: House Home Access: Stairs to enter Entrance Stairs-Rails: None Entrance Stairs-Number of Steps: 3-4 Home Layout: Two level;Bed/bath upstairs;1/2 bath on main level Home Equipment: Walker - 2 wheels;Cane - single point;Toilet riser Additional Comments: tub/shower.    Prior Function Level of Independence: Needs assistance   Gait / Transfers Assistance Needed: recently required assistanc e for ambulation  ADL's / Homemaking Assistance Needed: indpendent until recent decline        Hand Dominance        Extremity/Trunk Assessment   Upper Extremity Assessment: Overall WFL for tasks assessed           Lower Extremity Assessment: Generalized weakness         Communication  Communication: No difficulties  Cognition Arousal/Alertness: Awake/alert Behavior During Therapy: Restless;Agitated Overall Cognitive Status: Impaired/Different from baseline Area of Impairment: Memory;Following commands;Attention;Safety/judgement;Awareness;Problem solving   Current Attention Level: Selective Memory: Decreased  short-term memory;Decreased recall of precautions Following Commands: Follows one step commands inconsistently Safety/Judgement: Decreased awareness of deficits;Decreased awareness of safety   Problem Solving: Slow processing;Decreased initiation;Difficulty sequencing;Requires verbal cues General Comments: pt asked plan several times:  had asked to use commode and forgot.  Slow to initiate    General Comments      Exercises        Assessment/Plan    PT Assessment Patient needs continued PT services  PT Diagnosis Difficulty walking;Generalized weakness   PT Problem List Decreased strength;Decreased activity tolerance;Decreased balance;Decreased mobility;Decreased knowledge of precautions;Decreased safety awareness;Decreased knowledge of use of DME;Decreased cognition  PT Treatment Interventions DME instruction;Gait training;Functional mobility training;Therapeutic activities;Therapeutic exercise;Patient/family education   PT Goals (Current goals can be found in the Care Plan section) Acute Rehab PT Goals Patient Stated Goal: be able to get up to bedroom on second floor PT Goal Formulation: With family Time For Goal Achievement: 11/02/14 Potential to Achieve Goals: Fair    Frequency Min 3X/week   Barriers to discharge Decreased caregiver support;Inaccessible home environment      Co-evaluation   Reason for Co-Treatment: For patient/therapist safety PT goals addressed during session: Mobility/safety with mobility OT goals addressed during session: ADL's and self-care       End of Session Equipment Utilized During Treatment: Gait belt Activity Tolerance: Patient limited by fatigue Patient left: in bed;with call bell/phone within reach;with bed alarm set;with family/visitor present Nurse Communication: Mobility status         Time: 5945-8592 PT Time Calculation (min) (ACUTE ONLY): 30 min   Charges:   PT Evaluation $Initial PT Evaluation Tier I: 1 Procedure      PT G CodesClaretha Cooper 10/19/2014, 2:52 PM Tresa Endo PT [phone]

## 2014-10-19 NOTE — Care Management Note (Addendum)
    Page 1 of 2   10/20/2014     11:18:17 AM CARE MANAGEMENT NOTE 10/20/2014  Patient:  Dean Owens, Dean Owens   Account Number:  [id number]  Date Initiated:  10/14/2014  Documentation initiated by:  DAVIS,RHONDA  Subjective/Objective Assessment:   current chemo patient nause, vomiting, 40 lbs weight loss in 74months, poor po intake, ctscan shows new ascites.Pain not controlled by home meds.     Action/Plan:   home when stable   Anticipated DC Date:  10/20/2014   Anticipated DC Plan:  [place] referral  Clinical Social Worker      DC Planning Services  CM consult      [facility] Choice  HOME HEALTH   Choice offered to / List presented to:  C-3 Spouse      DME agency  [place] arranged  [place].   Status of service:  Completed, signed off Medicare Important Message given?  YES (If response is "NO", the following Medicare IM given date fields will be blank) Date Medicare IM given:  10/19/2014 Medicare IM given by:  [facility] Date Additional Medicare IM given:   Additional Medicare IM given by:    Discharge Disposition:  [place]  Per UR Regulation:  Reviewed for med. necessity/level of care/duration of stay  If discussed at [place] of Stay Meetings, dates discussed:    Comments:  10/20/14 11:00 Cm met with pt, pt's wife Dean Owens [phone] and pt's daughter to discuss [place].  Pt now agrees to HHPT/OT/aide/SW.  Dean Owens has chosen [facility] to render the aforementioned services.  Address andcontact information verified with Dean Owens.  referral given to [facility] rep, Kristen. CM called [facility] DME rep, Lecretia to please have hsopital bed and 3n1 delivered to pt's home today.  Dean Owens, Pt's daughter [phone] will receive DME.  CM requested MD to place HHPT/OT/Aide/SW orders, DME orders for hospital bed and 3n1 and face to face.  CM also gav e  [place] a Private Duty List for future respite care.  CM requested CSW, Jorene Guest, to please speak with Dean Owens about the possibility for [facility] to arrange SNF if necessary.  No other CM needs were communicated.  Mariane Masters, BSN, [place] [phone].  10-19-14 Sunday Spillers RN CM 1200 Spoke with patient at bedside regarding d/c plans. States he has been told by Dr. Benay Spice he will likely be ready for d/c in am. He is not sure he agrees. Asked him about his home situation, states his wife will care for him at d/c. Dr. Benay Spice had taked to him about a hospital bed and hospice. He states he is not interested in either. Offered HH services, he declines that as well. Encouraged patient to discuss with wife and doctor as he is likely deconditioned and would benefit from some assistance. Will continue to follow for d/c needs.  Feb. 19 2016/Rhonda L. Rosana Hoes, RN, BSN, CCM. Case Management [place] [phone] No discharge needs present of time of review.

## 2014-10-19 NOTE — Progress Notes (Signed)
[place] OFFICE PROGRESS NOTE   Diagnosis: Small bowel carcinoma  INTERVAL HISTORY:   The pain is much improved. His appetite is also improved.  Objective:  Vital signs in last 24 hours:  Blood pressure 127/71, pulse 96, temperature 97.3 F (36.3 C), temperature source Oral, resp. rate 16, height _0  (1.803 m), weight 128 lb 8 oz (58.287 kg), SpO2 100 %.    HEENT: No thrush Resp: Lungs bilaterally Cardio: Regular rate and rhythm GI: Soft, no hepatomegaly, nontender Vascular: Trace edema at the hands bilaterally Neuro: More alert, follows commands Skin: Erythema at the right palm and fingers    Portacath/PICC-without erythema  Lab Results:  Lab Results  Component Value Date   WBC 24.2* 10/17/2014   HGB 9.9* 10/17/2014   HCT 30.8* 10/17/2014   MCV 90.3 10/17/2014   PLT 276 10/17/2014   NEUTROABS 22.8* 10/17/2014   Ammonia 44   T4 15.8 Imaging:  US Abdomen Limited  10/17/2014   CLINICAL DATA:  Abdominal pain and diarrhea starting 5 days ago. Right upper quadrant pain for 7 months. History of squamous cell carcinoma and colon carcinoma as well as hypertension and diabetes. Known metastatic disease based on the prior CT scan report.  EXAM: US ABDOMEN LIMITED - RIGHT UPPER QUADRANT  COMPARISON:  CT, 10/13/2014.  FINDINGS: Gallbladder:  Sludge. No shadowing stones. No wall thickening. No evidence of acute cholecystitis.  Common bile duct:  Diameter: 3.2 mm.  Liver:  Right liver mass measuring 6.4 cm x 5.2 cm x 5.6 cm. No other liver masses. 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  Ascites. There is a midline mass measuring 4.6 cm reflecting the confluent adenopathy noted on the recent CT.  IMPRESSION: 1. No acute findings. No evidence of acute cholecystitis. There is gallbladder sludge but no shadowing stones. 2. Metastatic disease to the liver with a 6.4 cm right liver mass. Metastatic adenopathy. A midline mobile mass measuring 4.6 cm is  noted. Ascites. These findings are stable from the recent CT.   Electronically Signed   By: Lajean Manes M.D.   On: 10/17/2014 16:18    Medications: I have reviewed the patient's current medications.  1.Adenocarcinoma of the small bowel, stage III (T3 N1), status post a partial small bowel resection 10/14/2013   The tumor returned microsatellite instability high with loss of MSH2 expression   MRI of the liver 05/28/2014 with multiple hepatic metastases and portal/retroperitoneal lymphadenopathy, ultrasound-guided biopsy of the liver 05/31/2014 confirmed metastatic small bowel carcinoma   Cycle 1 FOLFOX 06/08/2014  Cycle 2 FOLFOX with Neulasta support 06/29/2014  Cycle 3 FOLFOX 07/13/2014  Cycle 4 FOLFOX 08/03/2014  Cycle 5 FOLFOX 08/22/2014  Restaging CT 09/02/2014 revealed progression of abdominal lymphadenopathy and peritoneal carcinomatosis  Cycle 1 Pembrolizumab 09/13/2014  Cycle 2 Pembrolizumab 10/04/2014 2. Colon cancer X3, 1997 and 1999, status post an intra-abdominal colectomy with ileorectal anastomosis in May 1999  3. History of Iron deficiency anemia  4. Diabetes  5. 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 Hypertension  7. Family history of colon cancer-he has hereditary non-polyposis colon cancer syndrome  8. 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 Ascites-likely secondary to portal hypertension versus carcinomatosis, repeat peritoneal fluid cytologies negative for  tumor cells  10. 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 Right neck pain-most likely secondary to a benign  musculoskeletal condition, CT of the neck 06/06/2014 with degenerative changes, no evidence of metastatic disease  12. Renal failure-likely related to carcinomatosis and hepatic metastases, he saw nephrology 06/07/2014, improved  13. Nausea and vomiting following cycle 1 FOLFOX 14. Neutropenia following cycle one FOLFOX, Neulasta added with cycle 2. He declined further Neulasta. 15. 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 History of Hypokalemia-likely secondary to diarrhea 17. Pain-likely secondary to metastatic lymphadenopathy and carcinomatosis, or tumor flare following pembrolizumab-improved  Assessment/Plan:  Dean Owens appears much improved today. This may be related to the steroids he received several days ago. We can follow-up on the cortisol level this morning and decide on hydrocortisone replacement. He should be a candidate for discharge to home on 10/20/2014 if he is eating and ambulating. The plan is to administer a third cycle of pembrolizumab next week.  Recommendations:   1. Switch to oral thyroid hormone replacement  2. Continue narcotic analgesics for pain 3. Continue lactulose 4. Follow-up a.m. cortisol level and add hydrocortisone replacement as needed 5. Increase diet and encouraged ambulation Dean Coder, MD  10/19/2014  8:36 AM

## 2014-10-19 NOTE — Progress Notes (Signed)
Patient ID: Dean Owens, male   DOB: [birth year], 69 y.o.   MRN: [id number]  [facility] PROGRESS NOTE  Dean Owens TIR:[id number] DOB: July 21, 1946 DOA: 10/13/2014 PCP: Horton Finer, MD   Brief narrative:    69 y.o. 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   In the ED, CT abdomen and pelvis c/w progression of the tumor. Hospital stay prolonged by persistent electrolyte abnormalities and poor oral intake, poor response to TPN (stopped since 2/22), confusion and poor ambulatory status.   Assessment/Plan:    Adenocarcinoma of the small bowel, stage III (T3 N1), status post a partial small bowel resection 10/14/2013  - per Dr. Benay Spice, pt will not be candidate for further systemic therapy unless performance status improves - per Dr. Benay Spice, not clear if pt's sudden clinical decline is from the cancer itself vs chemotx - future chemotx is not entirely excluded if pt gets better physically  - Await PT and OT evaluation.   Abdominal pain with nausea  - also unclear if secondary to progression of the disease vs chemo tx - better controlled with analgesia and antiemetics provided  - advanced diet to full, added Marinol to see if appetite better  - off TPN since 2/22  Acute encephalopathy - better since admission and per Dr. Benay Spice possibly from pituitary toxicity from the pembrolizumab vs hepatic encephalopathy - progression of metastatic colon cancer also possible underlying etiology  - TSH WNL but T4 is low and per Dr Benay Spice, possibly from chemo itself  - placed on lactulose. Ammonia level is improved. - A.m. cortisol level. 7. No clear indication to start hydrocortisone. May need further workup, which can be pursued as an outpatient.   Diarrhea - likely secondary to  recent chemotherapy - resolved but can recur as will start lactulose today   Severe PCM, in the context of chronic illness  - lost 45 pounds over the last 2 months. - TNA per pharmacy d/c 2/22 - Oral intake is improving  Hypokalemia with hypomagnesemia  - Improved with supplementation.  Anemia secondary to chemo and malnutrition, chronic disease  - transfused 1 U PRBC 2/20  Hyperglycemia - steroid induced, off solumedrol since 2/22 - still poor oral intake, per wife will avoid SSI for now to allow more rest and avoid CBG checks   Leukocytosis - likely reactive and also secondary to steroids - CBC in AM  Hypothyroidism - continue synthroid  Generalized weakness/failure to thrive - from rather aggressive nature of acute/chronic illness - PT and OT to see.  Underweight with severe PCM - Body mass index is 17.93 kg/(m^2).  DVT prophylaxis - On Lovenox SQ  Code Status: DNR Family Communication: plan of care discussed with patient Disposition Plan: D/C once cleared by oncology. Possible discharge 2/25.  IV access:  Peripheral IV  Procedures and diagnostic studies:     Ct Abdomen Pelvis W Contrast 10/13/2014 Progression of metastatic disease in the abdomen and pelvis with increased lymphadenopathy identified. There has also been increase in abdominal and pelvic ascites which now appears moderate. Hepatic metastases are not notably change. New small right pleural effusion. Status post colectomy.   CXR 10/13/2014 No acute cardiopulmonary disease.   Medical Consultants:  Oncology   Other Consultants:  PT/OT Nutritionist   Anti-Infectives:   None   Subjective: Patient denies any complaints. Jennet Maduro on  going home eventually. Able to tolerate orally.   Objective: Filed Vitals:   10/18/14 0155 10/18/14 0500 10/18/14 1409 10/18/14 2137  BP:  115/64 99/69 127/71  Pulse:  94 73 96  Temp:  97.5 F (36.4 C) 97.9 F (36.6 C) 97.3 F (36.3 C)  TempSrc:  Oral Oral  Oral  Resp:  20 16 16   Height:      Weight: 58.287 kg (128 lb 8 oz)     SpO2:  99% 100% 100%    Intake/Output Summary (Last 24 hours) at 10/19/14 0857 Last data filed at 10/18/14 1409  Gross per 24 hour  Intake    120 ml  Output    300 ml  Net   -180 ml    Exam:   General:  Pt is alert, follows commands appropriately, not in acute distress, looks better this AM but very weak and frail   Cardiovascular: Regular rate and rhythm,  no rubs, no gallops  Respiratory: Clear to auscultation bilaterally, no wheezing, no crackles, no rhonchi  Abdomen: Soft, non tender, slightly distended, bowel sounds present, no guarding  Extremities: bilateral upper ext swelling, pulses DP and PT palpable bilaterally  Neuro: Grossly nonfocal  Data Reviewed: Basic Metabolic Panel:  Recent Labs Lab 10/15/14 0434 10/16/14 0530 10/17/14 0500 10/18/14 0458 10/19/14 0530  NA 137 139 136 133* 134*  K 3.7 3.2* 3.1* 3.3* 4.0  CL 107 108 104 103 103  CO2 23 23 27 26 26   GLUCOSE 261* 189* 221* 155* 111*  BUN 18 17 16 14 19   CREATININE 0.77 0.61 0.46* 0.43* 0.51  CALCIUM 7.6* 8.2* 8.0* 7.7* 7.6*  MG 1.8 1.8 1.7 1.3* 1.8  PHOS 3.5 2.7 2.7 1.8* 1.6*   Liver Function Tests:  Recent Labs Lab 10/13/14 1804 10/14/14 0512 10/15/14 0434 10/16/14 0530 10/17/14 0500  AST 28 22 22 28 31   ALT 16 15 14 19 21   ALKPHOS 518* 479* 423* 427* 439*  BILITOT 1.6* 1.4* 1.0 0.7 0.8  PROT 5.9* 5.7* 5.3* 6.0 5.9*  ALBUMIN 1.6* 1.5* 1.4* 1.7* 1.8*    Recent Labs Lab 10/13/14 1804  LIPASE 14    Recent Labs Lab 10/18/14 0458 10/19/14 0530  AMMONIA 58* 44*   CBC:  Recent Labs Lab 10/13/14 1804 10/14/14 0512 10/15/14 0449 10/17/14 0500  WBC 18.4* 20.4* 13.6* 24.2*  NEUTROABS 17.1* 18.0* 12.8* 22.8*  HGB 12.1* 8.7* 7.8* 9.9*  HCT 38.0* 27.7* 24.5* 30.8*  MCV 89.0 90.2 90.4 90.3  PLT 284 336 252 276   CBG:  Recent Labs Lab 10/17/14 1633 10/17/14 2018 10/17/14 2346 10/18/14 0457  10/18/14 0745  GLUCAP 202* 199* 161* 157* 135*    Recent Results (from the past 240 hour(s))  Clostridium Difficile by PCR     Status: None   Collection Time: 10/14/14  2:14 AM  Result Value Ref Range Status   C difficile by pcr NEGATIVE NEGATIVE Final    Comment: Performed at [facility]     Scheduled Meds: . dronabinol  5 mg Oral BID AC  . enoxaparin (LOVENOX) injection  40 mg Subcutaneous QHS  . feeding supplement (ENSURE COMPLETE)  237 mL Oral BID BM  . lactulose  10 g Oral BID  . levothyroxine  50 mcg Oral QAC breakfast  . morphine  15 mg Oral Q12H  . pantoprazole (PROTONIX) IV  40 mg Intravenous QHS  . potassium chloride  40 mEq Oral Once   Continuous Infusions:     Akeria Hedstrom,  MD  [facility] Pager [phone]  If 7PM-7AM, please contact night-coverage [email or web address] Password [facility] 10/19/2014, 8:57 AM   LOS: 6 days

## 2014-10-20 LAB — CBC
HEMATOCRIT: 26.9 % — AB (ref 39.0–52.0)
Hemoglobin: 8.6 g/dL — ABNORMAL LOW (ref 13.0–17.0)
MCH: 29.7 pg (ref 26.0–34.0)
MCHC: 32 g/dL (ref 30.0–36.0)
MCV: 92.8 fL (ref 78.0–100.0)
Platelets: 188 10*3/uL (ref 150–400)
RBC: 2.9 MIL/uL — ABNORMAL LOW (ref 4.22–5.81)
RDW: 23.1 % — ABNORMAL HIGH (ref 11.5–15.5)
WBC: 15.5 10*3/uL — ABNORMAL HIGH (ref 4.0–10.5)

## 2014-10-20 LAB — BASIC METABOLIC PANEL
Anion gap: 6 (ref 5–15)
BUN: 18 mg/dL (ref 6–23)
CHLORIDE: 102 mmol/L (ref 96–112)
CO2: 25 mmol/L (ref 19–32)
Calcium: 7.7 mg/dL — ABNORMAL LOW (ref 8.4–10.5)
Creatinine, Ser: 0.57 mg/dL (ref 0.50–1.35)
GFR calc Af Amer: >90 mL/min (ref >90)
GFR calc non Af Amer: >90 mL/min (ref >90)
Glucose, Bld: 144 mg/dL — ABNORMAL HIGH (ref 70–99)
POTASSIUM: 3.8 mmol/L (ref 3.5–5.1)
Sodium: 133 mmol/L — ABNORMAL LOW (ref 135–145)

## 2014-10-20 LAB — CORTISOL: CORTISOL PLASMA: 13.8 ug/dL

## 2014-10-20 MED ORDER — LACTULOSE 10 GM/15ML PO SOLN: 10.0000 g | Freq: Every day | ORAL | Status: DC

## 2014-10-20 MED ORDER — MORPHINE SULFATE ER 15 MG PO TBCR: 15.0000 mg | EXTENDED_RELEASE_TABLET | Freq: Every morning | ORAL | Status: DC

## 2014-10-20 MED ORDER — HEPARIN SOD (PORK) LOCK FLUSH 100 UNIT/ML IV SOLN
500.0000 [IU] | Freq: Once | INTRAVENOUS | Status: DC
  Filled 2014-10-20: qty 5

## 2014-10-20 MED ORDER — DRONABINOL 5 MG PO CAPS: 5.0000 mg | ORAL_CAPSULE | Freq: Two times a day (BID) | ORAL | Status: DC

## 2014-10-20 MED ORDER — LACTULOSE 10 GM/15ML PO SOLN: 10.0000 g | Freq: Every day | ORAL | Status: AC

## 2014-10-20 MED ORDER — OXYCODONE-ACETAMINOPHEN 5-325 MG PO TABS: 1.0000 | ORAL_TABLET | Freq: Four times a day (QID) | ORAL | Status: DC | PRN

## 2014-10-20 NOTE — Discharge Summary (Signed)
[facility]  Physician Discharge Summary   Patient ID: Dean Owens MRN: [id number] DOB/AGE: 11/24/1945 69 y.o.  Admit date: 10/13/2014 Discharge date: 10/20/2014  PCP: Horton Finer, MD  DISCHARGE DIAGNOSES:  Principal Problem:   Abdominal pain Active Problems:   Small bowel carcinoma   Protein-calorie malnutrition   Hypothyroidism   Generalized abdominal pain   Weakness   RECOMMENDATIONS FOR OUTPATIENT FOLLOW UP: 1. Home health arranged.  DISCHARGE CONDITION: fair  Diet recommendation: Regular  Filed Weights   10/14/14 0512 10/16/14 0500 10/18/14 0155  Weight: 66.18 kg (145 lb 14.4 oz) 65.227 kg (143 lb 12.8 oz) 58.287 kg (128 lb 8 oz)    INITIAL HISTORY: 69 y.o. 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 In the ED, CT abdomen and pelvis c/w progression of the tumor. Hospital stay prolonged by persistent electrolyte abnormalities and poor oral intake, poor response to TPN (stopped since 2/22), confusion and poor ambulatory status.   Consultations:  Oncology   HOSPITAL COURSE:   Adenocarcinoma of the small bowel, stage III (T3 N1), status post a partial small bowel resection 10/14/2013  Per Dr. Benay Spice, pt will not be candidate for further systemic therapy unless performance status improves. Per Dr. Benay Spice, not clear if pt's sudden clinical decline is from the cancer itself vs chemotx. Future chemotx is not entirely excluded if pt gets better physically. Long-acting morphine has been prescribed for pain.  Abdominal pain with nausea  Also unclear if secondary to progression of the disease vs chemo tx. Better controlled with analgesia and antiemetics provided. Diet was advanced. Marinol was added. TPN was discontinued. He is eating better.   Acute  encephalopathy Thought to be secondary to drug toxicity. Hepatic encephalopathy. Progression of cancer. He was given lactulose. Ammonia level improved. Mentation appears to be close to baseline now.  Diarrhea Likely secondary to recent chemotherapy. Resolved. He still has some loose stools, which is most likely due to lactulose. He has been told that up to 4-5 bowel movements a day should be acceptable.  Severe PCM, in the context of chronic illness  lost 45 pounds over the last 2 months.  Anemia secondary to chemo and malnutrition, chronic disease  transfused 1 U PRBC 2/20  Hypothyroidism continue synthroid  Generalized weakness/failure to thrive from rather aggressive nature of acute/chronic illness. He was seen by PT and OT. SNF was recommended but the patient wants to go home. So home health has been arranged. Hospital bed at home. Discussed with his daughter as well.  Underweight with severe PCM - Body mass index is 17.93 kg/(m^2).  Patient stable. He is okay for discharge. Discussed with his daughter as well. Home health has been arranged.   PERTINENT LABS:  The results of significant diagnostics from this hospitalization (including imaging, microbiology, ancillary and laboratory) are listed below for reference.    Microbiology: Recent Results (from the past 240 hour(s))  Clostridium Difficile by PCR     Status: None   Collection Time: 10/14/14  2:14 AM  Result Value Ref Range Status   C difficile by pcr NEGATIVE NEGATIVE Final    Comment: Performed at [facility]: Basic Metabolic Panel:  Recent Labs Lab 10/15/14 0434 10/16/14 0530 10/17/14 0500 10/18/14 0458 10/19/14 0530 10/20/14 0600  NA 137 139 136 133* 134* 133*  K  3.7 3.2* 3.1* 3.3* 4.0 3.8  CL 107 108 104 103 103 102  CO2 23 23 27 26 26 25   GLUCOSE 261* 189* 221* 155* 111* 144*  BUN 18 17 16 14 19 18   CREATININE 0.77 0.61 0.46* 0.43* 0.51 0.57  CALCIUM 7.6* 8.2* 8.0* 7.7* 7.6* 7.7*    MG 1.8 1.8 1.7 1.3* 1.8  --   PHOS 3.5 2.7 2.7 1.8* 1.6*  --    Liver Function Tests:  Recent Labs Lab 10/13/14 1804 10/14/14 0512 10/15/14 0434 10/16/14 0530 10/17/14 0500  AST 28 22 22 28 31   ALT 16 15 14 19 21   ALKPHOS 518* 479* 423* 427* 439*  BILITOT 1.6* 1.4* 1.0 0.7 0.8  PROT 5.9* 5.7* 5.3* 6.0 5.9*  ALBUMIN 1.6* 1.5* 1.4* 1.7* 1.8*    Recent Labs Lab 10/13/14 1804  LIPASE 14    Recent Labs Lab 10/18/14 0458 10/19/14 0530  AMMONIA 58* 44*   CBC:  Recent Labs Lab 10/13/14 1804 10/14/14 0512 10/15/14 0449 10/17/14 0500 10/20/14 0600  WBC 18.4* 20.4* 13.6* 24.2* 15.5*  NEUTROABS 17.1* 18.0* 12.8* 22.8*  --   HGB 12.1* 8.7* 7.8* 9.9* 8.6*  HCT 38.0* 27.7* 24.5* 30.8* 26.9*  MCV 89.0 90.2 90.4 90.3 92.8  PLT 284 336 252 276 188    CBG:  Recent Labs Lab 10/17/14 1633 10/17/14 2018 10/17/14 2346 10/18/14 0457 10/18/14 0745  GLUCAP 202* 199* 161* 157* 135*     IMAGING STUDIES Ct Abdomen Pelvis W Contrast  10/13/2014   CLINICAL DATA:  Mid abdominal pain and diarrhea beginning 10/12/2014. History of metastatic colon cancer  EXAM: CT ABDOMEN AND PELVIS WITH CONTRAST  TECHNIQUE: Multidetector CT imaging of the abdomen and pelvis was performed using the standard protocol following bolus administration of intravenous contrast.  CONTRAST:  25 mL OMNIPAQUE IOHEXOL 300 MG/ML SOLN, 100 mL OMNIPAQUE IOHEXOL 300 MG/ML SOLN  COMPARISON:  CT abdomen and pelvis 09/02/2014 and 05/26/2014.  FINDINGS: The patient has a new very small right pleural effusion. No left pleural effusion is present and no pericardial effusion is identified.  As on the prior examination, multiple mass lesions are seen in the liver consistent with metastatic disease. Index lesion in the right hepatic lobe which had measured 12.2 x 8.7 cm today measures 12.7 x 7.6 cm on image 24. Difference in measurement is likely due to lack of contrast on the prior examination. The spleen, adrenal glands,  pancreas and kidneys are unremarkable.  There is a moderate volume of abdominal and pelvic ascites which is increased somewhat since the prior study. Extensive abdominal lymphadenopathy is again seen. Omental and mesenteric nodularity is again identified.  Index porta hepatis noted which had measured 3.2 x 4.9 cm today measures 4.0 x 4.8 cm on image 31.  Left periaortic node which had measured 2.9 x 2.5 cm today measures 2.9 x 2.8 cm on image 40.  Mass in the central mesentery anterior to the aorta which had measured 3.4 x 1.9 cm today measures 4.2 x 2.8 cm on image 52.  The patient is status post colectomy and Hartmann's pouch formation. There is no evidence of bowel obstruction.  No lytic or sclerotic bony lesion is identified.  IMPRESSION: Progression of metastatic disease in the abdomen and pelvis with increased lymphadenopathy identified. There has also been increase in abdominal and pelvic ascites which now appears moderate. Hepatic metastases are not notably change.  New small right pleural effusion.  Status post colectomy.   Electronically Signed  By: Inge Rise M.D.   On: 10/13/2014 19:32   US Abdomen Limited  10/17/2014   CLINICAL DATA:  Abdominal pain and diarrhea starting 5 days ago. Right upper quadrant pain for 7 months. History of squamous cell carcinoma and colon carcinoma as well as hypertension and diabetes. Known metastatic disease based on the prior CT scan report.  EXAM: US ABDOMEN LIMITED - RIGHT UPPER QUADRANT  COMPARISON:  CT, 10/13/2014.  FINDINGS: Gallbladder:  Sludge. No shadowing stones. No wall thickening. No evidence of acute cholecystitis.  Common bile duct:  Diameter: 3.2 mm.  Liver:  Right liver mass measuring 6.4 cm x 5.2 cm x 5.6 cm. No other liver masses. 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  Ascites. There is a midline mass measuring 4.6 cm reflecting the confluent adenopathy noted on the recent CT.  IMPRESSION: 1. No acute findings. No evidence of  acute cholecystitis. There is gallbladder sludge but no shadowing stones. 2. Metastatic disease to the liver with a 6.4 cm right liver mass. Metastatic adenopathy. A midline mobile mass measuring 4.6 cm is noted. Ascites. These findings are stable from the recent CT.   Electronically Signed   By: Lajean Manes M.D.   On: 10/17/2014 16:18   Dg Chest Port 1 View  10/13/2014   CLINICAL DATA:  Per CC nurse-came in today with abdominal pain starting yesterday. Took 2 Percocet at 1330 today. Has had diarrhea, decreased PO intake, increasing abdominal pain for the past few days with increased fatigue. Receiving chemo infusions currently. Denies fevers, SOB. RR even/unlabored. Has had acid reflux at home. No other c/c. Hx colon cancer, diabetic, ex-smoker  EXAM: PORTABLE CHEST - 1 VIEW  COMPARISON:  06/20/2006  FINDINGS: Cardiac silhouette normal in size and configuration. No mediastinal or hilar masses or evidence of adenopathy. Right anterior chest wall power Port-A-Cath extends through the right internal jugular vein to have its tip in the lower superior vena cava.  Clear lungs.  No pleural effusion or pneumothorax.  Bony thorax is demineralized but grossly intact.  IMPRESSION: No acute cardiopulmonary disease.   Electronically Signed   By: Lajean Manes M.D.   On: 10/13/2014 20:13    DISCHARGE EXAMINATION: Filed Vitals:   10/19/14 0620 10/19/14 1407 10/19/14 2122 10/20/14 0437  BP: 109/64 140/72 102/60 101/51  Pulse: 80 76 113 72  Temp: 98.4 F (36.9 C) 97.7 F (36.5 C) 98.3 F (36.8 C) 98.1 F (36.7 C)  TempSrc: Oral Oral Oral Oral  Resp: 20 20 20 20   Height:      Weight:      SpO2: 100% 100% 98% 100%   General appearance: alert, cooperative, appears stated age and no distress Resp: clear to auscultation bilaterally Cardio: regular rate and rhythm, S1, S2 normal, no murmur, click, rub or gallop GI: soft, non-tender; bowel sounds normal; no masses,  no organomegaly  DISPOSITION: Home with  daughter  Discharge Instructions    Call MD for:  extreme fatigue    Complete by:  As directed      Call MD for:  persistant dizziness or light-headedness    Complete by:  As directed      Call MD for:  persistant nausea and vomiting    Complete by:  As directed      Call MD for:  severe uncontrolled pain    Complete by:  As directed      Call MD for:  temperature >100.4    Complete by:  As  directed      Diet general    Complete by:  As directed      Discharge instructions    Complete by:  As directed   If you start having more than 4-5 BM's per day, please cut back on Lactulose to half the amount daily.     Increase activity slowly    Complete by:  As directed            ALLERGIES: No Known Allergies   Current Discharge Medication List    START taking these medications   Details  dronabinol (MARINOL) 5 MG capsule Take 1 capsule (5 mg total) by mouth 2 (two) times daily before lunch and supper. Qty: 60 capsule, Refills: 3    lactulose (CHRONULAC) 10 GM/15ML solution Take 15 mLs (10 g total) by mouth daily. Qty: 240 mL, Refills: 0   Associated Diagnoses: Small bowel carcinoma    morphine (MS CONTIN) 15 MG 12 hr tablet Take 1 tablet (15 mg total) by mouth every morning. Qty: 30 tablet, Refills: 1   Associated Diagnoses: Abdominal pain; Small bowel carcinoma      CONTINUE these medications which have CHANGED   Details  oxyCODONE-acetaminophen (PERCOCET/ROXICET) 5-325 MG per tablet Take 1-2 tablets by mouth every 6 (six) hours as needed for moderate pain or severe pain. Qty: 30 tablet, Refills: 0   Associated Diagnoses: Small bowel carcinoma      CONTINUE these medications which have NOT CHANGED   Details  ferrous sulfate 325 (65 FE) MG tablet Take 325 mg by mouth daily with breakfast.     lansoprazole (PREVACID) 15 MG capsule Take 15 mg by mouth daily.   Associated Diagnoses: Colon cancer    Multiple Vitamins-Minerals (ADULT GUMMY) CHEW Chew 2 capsules by mouth  every morning.     ondansetron (ZOFRAN) 8 MG tablet Take 1 tablet (8 mg total) by mouth every 8 (eight) hours as needed for nausea or vomiting. Begin 72 hours after chemo. Qty: 20 tablet, Refills: 2    dexamethasone (DECADRON) 4 MG tablet Take 2 tablets (8 mg total) by mouth 2 (two) times daily with a meal. For nausea Qty: 30 tablet, Refills: 0   Associated Diagnoses: Colon cancer    levothyroxine (SYNTHROID, LEVOTHROID) 50 MCG tablet Take 50 mcg by mouth daily before breakfast.    lidocaine-prilocaine (EMLA) cream Apply to port a cath site one hour prior to use. Qty: 30 g, Refills: 2   Associated Diagnoses: Small bowel carcinoma    potassium chloride SA (K-DUR,KLOR-CON) 20 MEQ tablet Take 1 tablet (20 mEq total) by mouth daily. Qty: 20 tablet, Refills: 1    prochlorperazine (COMPAZINE) 10 MG tablet Take 1 tablet (10 mg total) by mouth every 6 (six) hours as needed for nausea or vomiting. Qty: 60 tablet, Refills: 0   Associated Diagnoses: Colon cancer      STOP taking these medications     megestrol (MEGACE) 40 MG/ML suspension        Follow-up Information    Follow up with [facility].   Why:  physical and occupational therapy, aide, and Education officer, museum.  Also, hospital bed and 3n1 (commode).   Contact information:   [address] [place] [address] [phone]       Follow up with Horton Finer, MD. Schedule an appointment as soon as possible for a visit in 1 week.   Specialty:  Internal Medicine   Why:  post hospitalization follow up   Contact  information:   [address], [place] [address] [phone]       Follow up with Betsy Coder, MD.   Specialty:  Oncology   Contact information:   [place] [address] [phone]       TOTAL DISCHARGE TIME: 35 mins  [facility] Pager [phone]  10/20/2014, 4:34 PM

## 2014-10-20 NOTE — Progress Notes (Signed)
[facility] OFFICE PROGRESS NOTE   Diagnosis: Small bowel carcinoma  INTERVAL HISTORY:   The pain remains improved. He participated in physical therapy yesterday.  Objective:  Vital signs in last 24 hours:  Blood pressure 101/51, pulse 72, temperature 98.1 F (36.7 C), temperature source Oral, resp. rate 20, height 5' 11"  (1.803 m), weight 128 lb 8 oz (58.287 kg), SpO2 100 %.    HEENT: No thrush Resp: Lungs bilaterally Cardio: Regular rate and rhythm GI: Soft, no hepatomegaly, nontender Vascular: Trace edema at the hands bilaterally Neuro: Alert, follows commands Skin: Erythema at the right palm and fingers    Portacath/PICC-without erythema  Lab Results:  Lab Results  Component Value Date   WBC 15.5* 10/20/2014   HGB 8.6* 10/20/2014   HCT 26.9* 10/20/2014   MCV 92.8 10/20/2014   PLT 188 10/20/2014   NEUTROABS 22.8* 10/17/2014   Potassium 3.8, creatinine 0.57 Imaging:  US Abdomen Limited  10/17/2014   CLINICAL DATA:  Abdominal pain and diarrhea starting 5 days ago. Right upper quadrant pain for 7 months. History of squamous cell carcinoma and colon carcinoma as well as hypertension and diabetes. Known metastatic disease based on the prior CT scan report.  EXAM: US ABDOMEN LIMITED - RIGHT UPPER QUADRANT  COMPARISON:  CT, 10/13/2014.  FINDINGS: Gallbladder:  Sludge. No shadowing stones. No wall thickening. No evidence of acute cholecystitis.  Common bile duct:  Diameter: 3.2 mm.  Liver:  Right liver mass measuring 6.4 cm x 5.2 cm x 5.6 cm. No other liver masses. 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  Ascites. There is a midline mass measuring 4.6 cm reflecting the confluent adenopathy noted on the recent CT.  IMPRESSION: 1. No acute findings. No evidence of acute cholecystitis. There is gallbladder sludge but no shadowing stones. 2. Metastatic disease to the liver with a 6.4 cm right liver mass. Metastatic adenopathy. A midline mobile mass  measuring 4.6 cm is noted. Ascites. These findings are stable from the recent CT.   Electronically Signed   By: Lajean Manes M.D.   On: 10/17/2014 16:18    Medications: I have reviewed the patient's current medications.  1.Adenocarcinoma of the small bowel, stage III (T3 N1), status post a partial small bowel resection 10/14/2013   The tumor returned microsatellite instability high with loss of MSH2 expression   MRI of the liver 05/28/2014 with multiple hepatic metastases and portal/retroperitoneal lymphadenopathy, ultrasound-guided biopsy of the liver 05/31/2014 confirmed metastatic small bowel carcinoma   Cycle 1 FOLFOX 06/08/2014  Cycle 2 FOLFOX with Neulasta support 06/29/2014  Cycle 3 FOLFOX 07/13/2014  Cycle 4 FOLFOX 08/03/2014  Cycle 5 FOLFOX 08/22/2014  Restaging CT 09/02/2014 revealed progression of abdominal lymphadenopathy and peritoneal carcinomatosis  Cycle 1 Pembrolizumab 09/13/2014  Cycle 2 Pembrolizumab 10/04/2014 2. Colon cancer X3, 1997 and 1999, status post an intra-abdominal colectomy with ileorectal anastomosis in May 1999  3. History of Iron deficiency anemia  4. Diabetes  5. 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 Hypertension  7. Family history of colon cancer-he has hereditary non-polyposis colon cancer syndrome  8. 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 Ascites-likely secondary to portal hypertension versus carcinomatosis, repeat peritoneal fluid cytologies negative for tumor cells  10. 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 Right neck pain-most likely secondary to a benign musculoskeletal  condition, CT of the neck 06/06/2014 with degenerative changes, no evidence of metastatic disease  12. Renal failure-likely related to carcinomatosis and hepatic metastases, he saw nephrology 06/07/2014, improved  13. Nausea and vomiting following cycle 1 FOLFOX 14. Neutropenia following cycle one FOLFOX, Neulasta added with cycle 2. He declined further Neulasta. 15. 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 History of Hypokalemia-likely secondary to diarrhea 17. Pain-likely secondary to metastatic lymphadenopathy and carcinomatosis, or tumor flare following pembrolizumab-improved 18. Deconditioning  Assessment/Plan:  The pain appears much improved. He can be discharged if he is able to ambulate with assistance from his family. If not he will need skilled nursing facility placement to continue physical therapy. I will decrease the MS Contin and lactulose to once daily. I will follow-up on the cortisol level and decide on the need for hydrocortisone replacement.  Recommendations:   1. Continue MS Contin once daily 2. Continue physical therapy, home PT at discharge versus skilled nursing facility placement 3. Continue lactulose 4. Hydrocortisone replacement if the cortisol level is low today 5. Outpatient follow-up at the [facility] as scheduled Betsy Coder, MD  10/20/2014  10:50 AM

## 2014-10-20 NOTE — Progress Notes (Signed)
Clinical Social Work Department BRIEF PSYCHOSOCIAL ASSESSMENT 10/20/2014  Patient:  Dean Owens, Dean Owens     Account Number:  [id number]     Admit date:  10/13/2014  Clinical Social Worker:  Dean Owens  Date/Time:  10/20/2014 11:00 AM  Referred by:  Physician  Date Referred:  10/20/2014 Referred for  Other - See comment   Other Referral:   RNCM states that pt and pt wife plan for pt to d/c home with [facility], but wanted to speak with CSW re: placement to rehab from home if pt and pt wife unable to manage at home   Interview type:  Patient Other interview type:   and patient wife at bedside    PSYCHOSOCIAL DATA Living Status:  WIFE Admitted from facility:   Level of care:   Primary support name:  Dean Owens/wife/[phone] Primary support relationship to patient:  SPOUSE Degree of support available:   strong    CURRENT CONCERNS Current Concerns  Other - See comment   Other Concerns:    SOCIAL WORK ASSESSMENT / PLAN CSW received referral from [facility] who states that pt and pt wife plan for pt to d/c home with [facility] but wanted to speak with CSW re: placement to rehab from home if pt and pt wife unable to manage at home.    CSW met with pt and pt wife, Dean Owens at bedside. CSW introduced self and explained role. Pt wife discussed that she was interested in speaking with CSW to know the process of placement to rehab at home if they are unable to manage at home. CSW discussed that [facility] CSW can assist with placement from home. CSW explained that with pt insurance, Blue Medicare the [facility] PT/OT would have to be making recommendations for rehab in order for pt insurance to approve placement from home. Pt wife expressed understanding. CSW provided support as pt wife discussed that she is hopeful that pt needs can be managed at home, but relieved that there is an option for rehab from home if pt gets weaker.    RNCM aware of need to include [facility] SW upon discharge.    No further social work  needs identified at this time.    CSW signing off.   Assessment/plan status:  No Further Intervention Required Other assessment/ plan:   Information/referral to community resources:   [facility] list given as resource for pt and pt wife    PATIENT'S/FAMILY'S RESPONSE TO PLAN OF CARE: Pt alert and oriented x 4. Pt sleeping off and on during visit. Pt wife feels comfortable with pt discharging home with home health services and was able to witness pt work with therapy today. Pt wife expressed relief about knowing option of pt being placed to rehab from home if needed.    CSW signing off.    Dean Owens, MSW, [place] [facility] [phone]

## 2014-10-20 NOTE — Discharge Instructions (Signed)
Hepatic Encephalopathy °Hepatic encephalopathy is a syndrome. This is a set of symptoms that occur together. It is seen mostly in patients with damage to the liver known as cirrhosis. This is where normal liver tissue has been replaced by scar tissue.  °Symptoms of the syndrome include: °· Changes in personality. °· Mental impairment. °· A depressed level of consciousness. °These changes occur because toxins build up in the bloodstream. The build up occurs because the scarred liver cannot rid toxins from the body. The most important of these toxins is ammonia. Toxins can cause abnormal behavior and confusion. Toxins in the blood stream can impair your ability to take care of yourself or others. Some people become very sleepy and cannot be woken easily. In severe cases, the patient lapses into a coma.  °CAUSES  °There are many things that can cause liver damage that can lead to buildup of toxins. These include: °· Diseases that cause cirrhosis of the liver. °· Long-term alcohol use with progressive liver damage. °· Hepatitis B or C with ongoing infection and liver damage. °· Patients without cirrhosis who have undergone shunt surgery. °· Kidney failure. °· Bleeding in the stomach or intestines. °· Infection. °· Constipation. °· Medications that act upon the central nervous system. °· Diuretic therapy. °· Excessive dietary protein. °SYMPTOMS  °Symptoms of this syndrome are categorized or "staged" based on severity.  °· Stage 0. Minimal hepatic encephalopathy. No detectable changes in personality or behavior. Minimal changes in memory, concentration, mental function, and physical ability. °· Stage 1. Some lack of awareness. Shortened attention span. Problems with addition or subtraction. Possible problems with sleeping or a reversal of the normal sleep pattern. Euphoria, depression, or irritability may be present. Mild confusion. Slowing of mental ability. Tremors may be detected. °· Stage 2. Lethargy or apathy.  Disoriented. Strange behavior. Slurred speech. Obvious tremors. Drowsiness, unable to perform mental tasks. Personality changes, and confusion about time. °· Stage 3. Very sleepy but can be aroused. Unable to perform mental tasks, cannot keep track of time and place, marked confusion, amnesia, occasional fits of rage, speech cannot be understood. °· Stage 4. Coma with or without response to painful stimuli. °DIAGNOSIS  °In mild cases, a careful history and physical exam may lead your caregiver to consider possible mild hepatic encephalopathy as the cause of symptoms. The diagnosis is clearer in more severe cases. An elevated blood ammonia level is the classic blood test abnormality in patients with this syndrome. Other tests can be helpful to rule out other diseases.  °TREATMENT  °· Medications are often used to lower the ammonia level in the blood. This usually leads to improvement. °· Diets containing vegetable proteins are better than diets rich in animal protein, especially proteins derived from red meats. Eating well-cooked chicken and fish in addition to vegetable protein should be discussed with your caregiver. Malnourished patients are encouraged to add liquid nutritional supplements to their diet. °· Antibiotics are sometimes used to try to lessen the volume of bacteria in the intestines that produce ammonia. °· Moderate to severe cases of this syndrome usually require a hospital stay and medicine that is given directly into a vein (intravenously). °HOME CARE INSTRUCTIONS  °The goal at home is to avoid things that can make the condition worse and lead to a buildup of ammonia in the blood. °· Eat a well balanced diet. Your caregiver can help you with suggestions on this. °· Talk to your caregiver before taking vitamin supplements. Large doses of vitamins and minerals,   especially vitamin A, iron, or copper, can worsen liver damage. °· A low salt diet, water restriction, or diuretic medicine may be needed to  reduce fluid retention. °· Avoid alcohol and acetaminophen as well as any over-the-counter medications that contain acetaminophen (check labels). Only take over-the-counter or prescription medicines for pain, discomfort, or fever as directed by your caregiver. °· Avoid drugs that are toxic to the liver. Review your medications (both prescription and non-prescription) with your caregiver to make sure those you are taking will not be harmful. °· Blood tests may be needed. Follow your caregiver's advice regarding the timing of these. °· With this condition you play a critical role in maintaining your own good health. The failure to follow your caregiver's advice and these instructions may result in permanent disability or death. °SEEK MEDICAL CARE IF:  °· You have increasing fatigue or weakness. °· You develop increasing swelling of the abdomen, hands, feet, legs or face. °· You develop loss of appetite. °· You are feeling sick to your stomach (nausea) and vomiting. °· You develop jaundice. This is a yellow discoloration of the skin. °· You develop worsening problems with concentration, confusion, and/or problems with sleep. °SEEK IMMEDIATE MEDICAL CARE IF:  °· You vomit bright red blood or a coffee ground-looking material. °· You have blood in your stools. Or the stools turn black and tarry. °· You have a fever. °· You develop easy bruising or bleeding. °· You have a return of slurred speech, change in behavior, or confusion. °MAKE SURE YOU:  °· Understand these instructions. °· Will watch your condition. °· Will get help right away if you are not doing well or get worse. °Document Released: 10/22/2006 Document Revised: 11/04/2011 Document Reviewed: 07/29/2007 °ExitCare® Patient Information ©2015 [employer]. This information is not intended to replace advice given to you by your health care provider. Make sure you discuss any questions you have with your health care provider. ° °

## 2014-10-20 NOTE — Progress Notes (Signed)
Physical Therapy Treatment Patient Details Name: DVONTE GATLIFF MRN: [id number] DOB: 06/17/1946 Today's Date: 10/20/2014    History of Present Illness CANON GOLA is a 69 y.o. 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  CT abdomen and pelvis which shows progression of the tumor    PT Comments     Patient demonstrates improved concentration and functional mobility. Ambulated  X 74' with RW. Wife and daughter present and pleased with patient's progress. Pt consents to hospital bed and HHPT today with family present.  Follow Up Recommendations  Home health PT;Supervision/Assistance - 24 hour     Equipment Recommendations  Hospital bed;3in1 (PT) (family/patient to decide on 3 in 1.)    Recommendations for Other Services       Precautions / Restrictions Precautions Precautions: Fall    Mobility  Bed Mobility   Bed Mobility: Supine to Sit     Supine to sit: Supervision Sit to supine: Min assist   General bed mobility comments: assist legs onto bed.  Transfers Overall transfer level: Needs assistance Equipment used: Rolling walker (2 wheeled) Transfers: Sit to/from Stand Sit to Stand: Min guard;From elevated surface         General transfer comment: much improved standing from higher surface  Ambulation/Gait Ambulation/Gait assistance: Min guard;Min assist Ambulation Distance (Feet): 420 Feet Assistive device: Rolling walker (2 wheeled) Gait Pattern/deviations: Step-through pattern Gait velocity: slow   General Gait Details: much improved gait, no loss of balance during ambulation with RW, stands without UE w/ supervision, family present for  therapy, very pleased with patient's improvement.   Stairs            Wheelchair Mobility    Modified Rankin (Stroke Patients Only)       Balance            Standing balance support: During functional activity;Bilateral upper extremity supported Standing balance-Leahy Scale: Fair                      Cognition Arousal/Alertness: Awake/alert Behavior During Therapy: WFL for tasks assessed/performed Overall Cognitive Status: Impaired/Different from baseline     Current Attention Level: Sustained Memory: Decreased recall of precautions Following Commands: Follows one step commands consistently       General Comments: much improved with  following directions and participation with improved concentration    Exercises      General Comments        Pertinent Vitals/Pain Pain Assessment: Faces Faces Pain Scale: Hurts little more Pain Location: knees Pain Descriptors / Indicators: Aching Pain Intervention(s): Monitored during session    Home Living                      Prior Function            PT Goals (current goals can now be found in the care plan section) Progress towards PT goals: Progressing toward goals    Frequency  Min 3X/week    PT Plan Discharge plan needs to be updated    Co-evaluation             End of Session Equipment Utilized During Treatment: Gait belt Activity Tolerance: Patient tolerated treatment well Patient left: in bed;with call bell/phone within reach;with bed alarm set;with family/visitor present     Time: 1324-4010 PT Time Calculation (min) (ACUTE ONLY): 33  min  Charges:  $Gait Training: 23-37 mins                    G Codes:      Claretha Cooper 10/20/2014, 10:09 AM Tresa Endo PT [phone]

## 2014-10-23 ENCOUNTER — Other Ambulatory Visit: Payer: Self-pay | Admitting: Oncology

## 2014-10-25 ENCOUNTER — Telehealth: Payer: Self-pay | Admitting: *Deleted

## 2014-10-25 ENCOUNTER — Other Ambulatory Visit (HOSPITAL_BASED_OUTPATIENT_CLINIC_OR_DEPARTMENT_OTHER): Payer: Medicare Other

## 2014-10-25 ENCOUNTER — Telehealth: Payer: Self-pay

## 2014-10-25 ENCOUNTER — Ambulatory Visit (HOSPITAL_BASED_OUTPATIENT_CLINIC_OR_DEPARTMENT_OTHER): Payer: Medicare Other

## 2014-10-25 ENCOUNTER — Telehealth: Payer: Self-pay | Admitting: Nurse Practitioner

## 2014-10-25 ENCOUNTER — Other Ambulatory Visit (HOSPITAL_COMMUNITY)
Admission: RE | Admit: 2014-10-25 | Discharge: 2014-10-25 | Disposition: A | Discharge status: Home / Self Care | Payer: Medicare Other | Source: Ambulatory Visit | Attending: Oncology | Admitting: Oncology

## 2014-10-25 ENCOUNTER — Ambulatory Visit: Payer: Medicare Other

## 2014-10-25 ENCOUNTER — Ambulatory Visit (HOSPITAL_BASED_OUTPATIENT_CLINIC_OR_DEPARTMENT_OTHER): Payer: Medicare Other | Admitting: Nurse Practitioner

## 2014-10-25 VITALS — BP 122/68 | HR 92 | Temp 98.3°F | Resp 18 | Ht 71.0 in | Wt 147.6 lb

## 2014-10-25 DIAGNOSIS — C179 Malignant neoplasm of small intestine, unspecified: Secondary | ICD-10-CM | POA: Insufficient documentation

## 2014-10-25 DIAGNOSIS — R188 Other ascites: Secondary | ICD-10-CM

## 2014-10-25 DIAGNOSIS — Z95828 Presence of other vascular implants and grafts: Secondary | ICD-10-CM

## 2014-10-25 DIAGNOSIS — Z800 Family history of malignant neoplasm of digestive organs: Secondary | ICD-10-CM

## 2014-10-25 DIAGNOSIS — I10 Essential (primary) hypertension: Secondary | ICD-10-CM

## 2014-10-25 DIAGNOSIS — C786 Secondary malignant neoplasm of retroperitoneum and peritoneum: Secondary | ICD-10-CM

## 2014-10-25 DIAGNOSIS — D638 Anemia in other chronic diseases classified elsewhere: Secondary | ICD-10-CM

## 2014-10-25 DIAGNOSIS — C787 Secondary malignant neoplasm of liver and intrahepatic bile duct: Secondary | ICD-10-CM

## 2014-10-25 DIAGNOSIS — N289 Disorder of kidney and ureter, unspecified: Secondary | ICD-10-CM

## 2014-10-25 DIAGNOSIS — D6481 Anemia due to antineoplastic chemotherapy: Secondary | ICD-10-CM

## 2014-10-25 DIAGNOSIS — Z5112 Encounter for antineoplastic immunotherapy: Secondary | ICD-10-CM

## 2014-10-25 DIAGNOSIS — D509 Iron deficiency anemia, unspecified: Secondary | ICD-10-CM

## 2014-10-25 DIAGNOSIS — Z1509 Genetic susceptibility to other malignant neoplasm: Secondary | ICD-10-CM

## 2014-10-25 DIAGNOSIS — C499 Malignant neoplasm of connective and soft tissue, unspecified: Secondary | ICD-10-CM

## 2014-10-25 DIAGNOSIS — M542 Cervicalgia: Secondary | ICD-10-CM

## 2014-10-25 DIAGNOSIS — E039 Hypothyroidism, unspecified: Secondary | ICD-10-CM

## 2014-10-25 LAB — CBC WITH DIFFERENTIAL/PLATELET
BASO%: 0 % (ref 0.0–2.0)
Basophils Absolute: 0 10*3/uL (ref 0.0–0.1)
EOS%: 0.3 % (ref 0.0–7.0)
Eosinophils Absolute: 0.1 10*3/uL (ref 0.0–0.5)
HCT: 28.4 % — ABNORMAL LOW (ref 38.4–49.9)
HGB: 9.4 g/dL — ABNORMAL LOW (ref 13.0–17.1)
LYMPH#: 0.5 10*3/uL — AB (ref 0.9–3.3)
LYMPH%: 2.4 % — ABNORMAL LOW (ref 14.0–49.0)
MCH: 30.5 pg (ref 27.2–33.4)
MCHC: 32.9 g/dL (ref 32.0–36.0)
MCV: 92.6 fL (ref 79.3–98.0)
MONO#: 0.9 10*3/uL (ref 0.1–0.9)
MONO%: 4.7 % (ref 0.0–14.0)
NEUT#: 18.4 10*3/uL — ABNORMAL HIGH (ref 1.5–6.5)
NEUT%: 92.6 % — ABNORMAL HIGH (ref 39.0–75.0)
Platelets: 350 10*3/uL (ref 140–400)
RBC: 3.07 10*6/uL — AB (ref 4.20–5.82)
RDW: 22.7 % — AB (ref 11.0–14.6)
WBC: 19.9 10*3/uL — ABNORMAL HIGH (ref 4.0–10.3)

## 2014-10-25 LAB — COMPREHENSIVE METABOLIC PANEL (CC13)
ALK PHOS: 462 U/L — AB (ref 40–150)
ALT: 33 U/L (ref 0–55)
ANION GAP: 10 meq/L (ref 3–11)
AST: 22 U/L (ref 5–34)
Albumin: 1.5 g/dL — ABNORMAL LOW (ref 3.5–5.0)
BILIRUBIN TOTAL: 0.74 mg/dL (ref 0.20–1.20)
BUN: 12.7 mg/dL (ref 7.0–26.0)
CALCIUM: 8 mg/dL — AB (ref 8.4–10.4)
CO2: 21 mEq/L — ABNORMAL LOW (ref 22–29)
Chloride: 105 mEq/L (ref 98–109)
Creatinine: 0.6 mg/dL — ABNORMAL LOW (ref 0.7–1.3)
EGFR: >90 mL/min/{1.73_m2} (ref >90)
Glucose: 93 mg/dl (ref 70–140)
Potassium: 3.7 mEq/L (ref 3.5–5.1)
SODIUM: 135 meq/L — AB (ref 136–145)
Total Protein: 5.7 g/dL — ABNORMAL LOW (ref 6.4–8.3)

## 2014-10-25 LAB — AMMONIA: AMMONIA: 58 umol/L — AB (ref 11–32)

## 2014-10-25 MED ORDER — OXYCODONE-ACETAMINOPHEN 5-325 MG PO TABS
ORAL_TABLET | ORAL | Status: AC
  Filled 2014-10-25: qty 2

## 2014-10-25 MED ORDER — SODIUM CHLORIDE 0.9 % IJ SOLN
10.0000 mL | INTRAMUSCULAR | Status: DC | PRN
  Administered 2014-10-25: 10 mL
  Filled 2014-10-25: qty 10

## 2014-10-25 MED ORDER — HEPARIN SOD (PORK) LOCK FLUSH 100 UNIT/ML IV SOLN
500.0000 [IU] | Freq: Once | INTRAVENOUS | Status: AC | PRN
  Administered 2014-10-25: 500 [IU]
  Filled 2014-10-25: qty 5

## 2014-10-25 MED ORDER — SODIUM CHLORIDE 0.9 % IJ SOLN
10.0000 mL | INTRAMUSCULAR | Status: DC | PRN
  Administered 2014-10-25: 10 mL via INTRAVENOUS
  Filled 2014-10-25: qty 10

## 2014-10-25 MED ORDER — SODIUM CHLORIDE 0.9 % IV SOLN
Freq: Once | INTRAVENOUS | Status: AC
  Administered 2014-10-25: 10:00:00 via INTRAVENOUS

## 2014-10-25 MED ORDER — OXYCODONE-ACETAMINOPHEN 5-325 MG PO TABS
2.0000 | ORAL_TABLET | Freq: Once | ORAL | Status: AC
  Administered 2014-10-25: 2 via ORAL

## 2014-10-25 MED ORDER — PROCHLORPERAZINE MALEATE 10 MG PO TABS
ORAL_TABLET | ORAL | Status: AC
  Filled 2014-10-25: qty 1

## 2014-10-25 MED ORDER — PROCHLORPERAZINE MALEATE 10 MG PO TABS
10.0000 mg | ORAL_TABLET | Freq: Once | ORAL | Status: AC
  Administered 2014-10-25: 10 mg via ORAL

## 2014-10-25 MED ORDER — SODIUM CHLORIDE 0.9 % IV SOLN
200.0000 mg | Freq: Once | INTRAVENOUS | Status: AC
  Administered 2014-10-25: 200 mg via INTRAVENOUS
  Filled 2014-10-25: qty 8

## 2014-10-25 NOTE — Patient Instructions (Signed)
[facility] Discharge Instructions for Patients Receiving Chemotherapy  Today you received the following chemotherapy agents Keytruda  To help prevent nausea and vomiting after your treatment, we encourage you to take your nausea medication as directed/prescribed   If you develop nausea and vomiting that is not controlled by your nausea medication, call the clinic.   BELOW ARE SYMPTOMS THAT SHOULD BE REPORTED IMMEDIATELY:  *FEVER GREATER THAN 100.5 F  *CHILLS WITH OR WITHOUT FEVER  NAUSEA AND VOMITING THAT IS NOT CONTROLLED WITH YOUR NAUSEA MEDICATION  *UNUSUAL SHORTNESS OF BREATH  *UNUSUAL BRUISING OR BLEEDING  TENDERNESS IN MOUTH AND THROAT WITH OR WITHOUT PRESENCE OF ULCERS  *URINARY PROBLEMS  *BOWEL PROBLEMS  UNUSUAL RASH Items with * indicate a potential emergency and should be followed up as soon as possible.  Feel free to call the clinic you have any questions or concerns. The clinic phone number is [phone].    

## 2014-10-25 NOTE — Telephone Encounter (Addendum)
Patient for discharge today.  [facility] would like to know if Dr. Benay Spice will be the attending.  Santiago Glad call back number [phone] ext [phone].

## 2014-10-25 NOTE — Telephone Encounter (Signed)
Pt confirmed labs/ov per 03/01 POF, gave pt AVS... KJ, sent msg to move chemo down

## 2014-10-25 NOTE — Patient Instructions (Signed)

## 2014-10-25 NOTE — Telephone Encounter (Signed)
Per Dr. Benay Spice; notified Santiago Glad at [facility] that Dr. Benay Spice will be the attending physician for pt.  Santiago Glad verbalized understanding.

## 2014-10-25 NOTE — Telephone Encounter (Signed)
Faxed information about Marinol prescription to [employer] per Colletta Maryland at [facility] request.

## 2014-10-25 NOTE — Progress Notes (Addendum)
[facility] OFFICE PROGRESS NOTE   Diagnosis:  Small bowel carcinoma  INTERVAL HISTORY:   Dean Owens returns as scheduled. He was discharged from the hospital on 10/20/2014. Abdominal pain continues to be improved. He is currently taking MS Contin twice daily with Percocet 3 times a day. He denies nausea/vomiting. Appetite continues to be poor. He felt that Marinol helped in the hospital. He was unable to obtain Marinol as an outpatient due to insurance reasons. He is having 4-5 loose stools a day. He is currently on lactulose 15 mL daily for an elevated ammonia level. He remains in bed most of the day. Physical therapy is scheduled to begin this week.  Objective:  Vital signs in last 24 hours:  Blood pressure 122/68, pulse 92, temperature 98.3 F (36.8 C), temperature source Oral, resp. rate 18, height 5' 11" (1.803 m), weight 147 lb 9.6 oz (66.951 kg), SpO2 100 %.   He was examined in the wheelchair as he did not feel he could maneuver onto the exam table. HEENT: No thrush or ulcers. Mucous membranes are moist. Resp: Lungs clear bilaterally. Cardio: Regular rate and rhythm. GI: Mild generalized tenderness. Fullness right mid abdomen. No hepatomegaly. Vascular: Trace lower leg edema bilaterally.  Port-A-Cath without erythema.    Lab Results:  Lab Results  Component Value Date   WBC 19.9* 10/25/2014   HGB 9.4* 10/25/2014   HCT 28.4* 10/25/2014   MCV 92.6 10/25/2014   PLT 350 10/25/2014   NEUTROABS 18.4* 10/25/2014    Imaging:  No results found.  Medications: I have reviewed the patient's current medications.  Assessment/Plan: 1.Adenocarcinoma of the small bowel, stage III (T3 N1), status post a partial small bowel resection 10/14/2013   The tumor returned microsatellite instability high with loss of MSH2 expression   MRI of the liver 05/28/2014 with multiple hepatic metastases and portal/retroperitoneal lymphadenopathy, ultrasound-guided biopsy of  the liver 05/31/2014 confirmed metastatic small bowel carcinoma   Cycle 1 FOLFOX 06/08/2014  Cycle 2 FOLFOX with Neulasta support 06/29/2014  Cycle 3 FOLFOX 07/13/2014  Cycle 4 FOLFOX 08/03/2014  Cycle 5 FOLFOX 08/22/2014  Restaging CT 09/02/2014 revealed progression of abdominal lymphadenopathy and peritoneal carcinomatosis  Cycle 1 Pembrolizumab 09/13/2014  Cycle 2 Pembrolizumab 10/04/2014  CT abdomen/pelvis 10/13/2014 showed no significant change in liver metastases, increased lymphadenopathy in the abdomen and pelvis and increase in abdominal and pelvic ascites. 2. Colon cancer X3, 1997 and 1999, status post an intra-abdominal colectomy with ileorectal anastomosis in May 1999  3. History of Iron deficiency anemia  4. Diabetes  5. 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 Hypertension  7. Family history of colon cancer-he has hereditary non-polyposis colon cancer syndrome  8. 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 Ascites-likely secondary to portal hypertension versus carcinomatosis, repeat peritoneal fluid cytologies negative for tumor cells  10. 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 Right neck pain-most likely secondary to a benign musculoskeletal condition, CT of the neck 06/06/2014 with degenerative changes, no evidence of metastatic disease  12. Renal failure-likely related to carcinomatosis and hepatic metastases, he saw nephrology 06/07/2014, improved  13. Nausea and vomiting following cycle 1 FOLFOX 14. Neutropenia following cycle one FOLFOX, Neulasta added with cycle 2. He declined further Neulasta.  15. Anemia  secondary to chronic disease and chemotherapy 16. History of Hypokalemia-likely secondary to diarrhea 17. Pain-likely secondary to metastatic lymphadenopathy and carcinomatosis, or tumor flare following pembrolizumab-improved 18. Deconditioning 19. Hospitalization 10/13/2014 through 10/20/2014 with poorly controlled pain. The pain was felt to likely be secondary to metastatic lymphadenopathy and carcinomatosis or alternately tumor flare following pembrolizumab. The pain was improved at time of discharge. 20. Elevated ammonia level during the recent hospitalization. Currently on lactulose.   Disposition: Dean Owens appears unchanged. Pain continues to be improved as compared to when he was attended to the hospital. The CT scan done in the hospital did not show significant progression. Dr. Benay Spice recommends continuing pembrolizumab with cycle 3 today.  He is deconditioned. He is scheduled to begin working with physical therapy this week.  His appetite improved in the hospital. He feels this was due to Marinol. We will submit paperwork to his insurance company to try to obtain Marinol for him.  He is having diarrhea likely due to lactulose. We will place the lactulose on hold and obtain a repeat ammonia level today.  He will return for a follow-up visit in 7-10 days. He will contact the office in the interim with any problems.  Patient seen with Dr. Benay Spice. 25 minutes were spent face-to-face at today's visit with the majority of that time involved in counseling/coordination of care.    [name] ANP/GNP-BC   10/25/2014  9:31 AM  This was a shared visit with [name]. His performance status has improved since admission to the hospital last week. He developed diarrhea when lactulose was started. The diarrhea is most likely secondary to lactulose, but it is possible he is developing colitis. He will discontinue lactulose. We will try steroids if he has persistent diarrhea.  The  cortisol level returned normal. I doubt he has pituitary toxicity from the PD1 inhibitor.  He will begin home physical therapy and will try Marinol as an appetite stimulant. This helped when he was in the hospital.  Dean Owens will return for an office visit next week.  Julieanne Manson, M.D.

## 2014-10-25 NOTE — Telephone Encounter (Signed)
Per staff message and POF I have adjusted and scheduled appts. Advised scheduler of appts. JMW  

## 2014-10-26 ENCOUNTER — Telehealth: Payer: Self-pay | Admitting: *Deleted

## 2014-10-26 ENCOUNTER — Telehealth: Payer: Self-pay | Admitting: Medical Oncology

## 2014-10-26 ENCOUNTER — Telehealth: Payer: Self-pay

## 2014-10-26 NOTE — Telephone Encounter (Signed)
Voicemail message from [place] with [facility].  "Dean Owens approved for one year effective 10-25-2014.  Will send approval letter and have notified patient.  Call provider line [phone] if any questions."

## 2014-10-26 NOTE — Telephone Encounter (Signed)
-----   Message from Ladell Pier, MD sent at 10/25/2014  5:19 PM EST ----- Please call wife, ammonia is still mildlly elevated, hold lactulose, resume if he becomes more confused or somnolent

## 2014-10-26 NOTE — Telephone Encounter (Signed)
Please call Colletta Maryland at [phone] regarding medication auth -need more information . Forwarded call to [employer]

## 2014-10-26 NOTE — Telephone Encounter (Signed)
Call received from [facility] regarding [place] prescription. Was informed they needed provider information. For this prescription. Was faxed the enrollment application. Spoke with Charlena Cross, clinical information was filled out and Dr. Benay Spice signed paperwork. Brought the paperwork to [facility] for her to finish provider information.

## 2014-10-26 NOTE — Telephone Encounter (Signed)
Per Dr. Benay Spice; notified pt's wife that pt ammonia is still mildly elevated, hold lactulose, resume if he becomes more confused or somnolent.  Pt's wife verbalized understanding and expressed appreciation for call.

## 2014-10-26 NOTE — Telephone Encounter (Signed)
Called [employer] regarding pt Marinol rx. Medication was approved. Auth number is [id number]. Information provided to [place].

## 2014-10-27 LAB — CEA: CEA: 3 ng/mL (ref 0.0–5.0)

## 2014-11-02 ENCOUNTER — Telehealth: Payer: Self-pay | Admitting: *Deleted

## 2014-11-02 DIAGNOSIS — C179 Malignant neoplasm of small intestine, unspecified: Secondary | ICD-10-CM

## 2014-11-02 NOTE — Telephone Encounter (Signed)
Call from Curt Bears, [place] with [facility] requesting half rails for pt's hospital bed. He is having difficulty getting out of bed with full rails. Order faxed to [phone].

## 2014-11-04 ENCOUNTER — Ambulatory Visit (HOSPITAL_BASED_OUTPATIENT_CLINIC_OR_DEPARTMENT_OTHER): Payer: Medicare Other

## 2014-11-04 ENCOUNTER — Ambulatory Visit (HOSPITAL_BASED_OUTPATIENT_CLINIC_OR_DEPARTMENT_OTHER): Payer: Medicare Other | Admitting: Nurse Practitioner

## 2014-11-04 ENCOUNTER — Other Ambulatory Visit (HOSPITAL_BASED_OUTPATIENT_CLINIC_OR_DEPARTMENT_OTHER): Payer: Medicare Other

## 2014-11-04 VITALS — BP 123/69 | HR 99 | Temp 97.7°F | Resp 18 | Ht 71.0 in | Wt 144.7 lb

## 2014-11-04 DIAGNOSIS — D6481 Anemia due to antineoplastic chemotherapy: Secondary | ICD-10-CM

## 2014-11-04 DIAGNOSIS — E039 Hypothyroidism, unspecified: Secondary | ICD-10-CM

## 2014-11-04 DIAGNOSIS — D509 Iron deficiency anemia, unspecified: Secondary | ICD-10-CM

## 2014-11-04 DIAGNOSIS — C787 Secondary malignant neoplasm of liver and intrahepatic bile duct: Secondary | ICD-10-CM

## 2014-11-04 DIAGNOSIS — C179 Malignant neoplasm of small intestine, unspecified: Secondary | ICD-10-CM

## 2014-11-04 DIAGNOSIS — R109 Unspecified abdominal pain: Secondary | ICD-10-CM

## 2014-11-04 DIAGNOSIS — Z95828 Presence of other vascular implants and grafts: Secondary | ICD-10-CM

## 2014-11-04 DIAGNOSIS — Z452 Encounter for adjustment and management of vascular access device: Secondary | ICD-10-CM

## 2014-11-04 LAB — CBC WITH DIFFERENTIAL/PLATELET
BASO%: 0.3 % (ref 0.0–2.0)
Basophils Absolute: 0.1 10*3/uL (ref 0.0–0.1)
EOS ABS: 0.1 10*3/uL (ref 0.0–0.5)
EOS%: 0.4 % (ref 0.0–7.0)
HCT: 29.1 % — ABNORMAL LOW (ref 38.4–49.9)
HGB: 9.5 g/dL — ABNORMAL LOW (ref 13.0–17.1)
LYMPH%: 4.8 % — ABNORMAL LOW (ref 14.0–49.0)
MCH: 30.7 pg (ref 27.2–33.4)
MCHC: 32.6 g/dL (ref 32.0–36.0)
MCV: 94.2 fL (ref 79.3–98.0)
MONO#: 1.5 10*3/uL — AB (ref 0.1–0.9)
MONO%: 7.6 % (ref 0.0–14.0)
NEUT%: 86.9 % — ABNORMAL HIGH (ref 39.0–75.0)
NEUTROS ABS: 16.7 10*3/uL — AB (ref 1.5–6.5)
Platelets: 394 10*3/uL (ref 140–400)
RBC: 3.09 10*6/uL — ABNORMAL LOW (ref 4.20–5.82)
RDW: 19 % — AB (ref 11.0–14.6)
WBC: 19.2 10*3/uL — ABNORMAL HIGH (ref 4.0–10.3)
lymph#: 0.9 10*3/uL (ref 0.9–3.3)

## 2014-11-04 LAB — COMPREHENSIVE METABOLIC PANEL (CC13)
ALBUMIN: 1.4 g/dL — AB (ref 3.5–5.0)
ALT: 13 U/L (ref 0–55)
ANION GAP: 8 meq/L (ref 3–11)
AST: 19 U/L (ref 5–34)
Alkaline Phosphatase: 480 U/L — ABNORMAL HIGH (ref 40–150)
BUN: 13.4 mg/dL (ref 7.0–26.0)
CO2: 21 meq/L — AB (ref 22–29)
Calcium: 7.5 mg/dL — ABNORMAL LOW (ref 8.4–10.4)
Chloride: 106 mEq/L (ref 98–109)
Creatinine: 0.8 mg/dL (ref 0.7–1.3)
EGFR: >90 mL/min/{1.73_m2} (ref >90)
GLUCOSE: 76 mg/dL (ref 70–140)
POTASSIUM: 3.8 meq/L (ref 3.5–5.1)
SODIUM: 134 meq/L — AB (ref 136–145)
TOTAL PROTEIN: 5.5 g/dL — AB (ref 6.4–8.3)
Total Bilirubin: 0.76 mg/dL (ref 0.20–1.20)

## 2014-11-04 MED ORDER — HEPARIN SOD (PORK) LOCK FLUSH 100 UNIT/ML IV SOLN
500.0000 [IU] | Freq: Once | INTRAVENOUS | Status: AC
  Administered 2014-11-04: 500 [IU] via INTRAVENOUS
  Filled 2014-11-04: qty 5

## 2014-11-04 MED ORDER — SODIUM CHLORIDE 0.9 % IJ SOLN
10.0000 mL | INTRAMUSCULAR | Status: DC | PRN
  Administered 2014-11-04: 10 mL via INTRAVENOUS
  Filled 2014-11-04: qty 10

## 2014-11-04 MED ORDER — MORPHINE SULFATE ER 15 MG PO TBCR: 15.0000 mg | EXTENDED_RELEASE_TABLET | Freq: Every morning | ORAL | Status: DC

## 2014-11-04 MED ORDER — OXYCODONE-ACETAMINOPHEN 5-325 MG PO TABS: 1.0000 | ORAL_TABLET | Freq: Four times a day (QID) | ORAL | Status: DC | PRN

## 2014-11-04 NOTE — Progress Notes (Addendum)
[facility] OFFICE PROGRESS NOTE   Diagnosis:  Small bowel carcinoma  INTERVAL HISTORY:   [name] returns as scheduled. He is feeling a little stronger. He is working with physical therapy and occupational therapy twice a week. Appetite continues to be poor. He is taking Marinol but thus far has noted no significant improvement. He states that his pain is "not bad". He continues MS Contin with Percocet as needed. The diarrhea has essentially resolved. He is now having bowel movements every 3 days. No significant confusion since discontinuation of lactulose.  Objective:  Vital signs in last 24 hours:  Blood pressure 123/69, pulse 99, temperature 97.7 F (36.5 C), temperature source Oral, resp. rate 18, height 5' 11" (1.803 m), weight 144 lb 11.2 oz (65.635 kg), SpO2 99 %.   Chronically ill-appearing man. HEENT: No thrush or ulcers. Mucous membranes are moist. Resp: Lungs clear bilaterally. Cardio: Regular rate and rhythm. GI: Abdomen soft and nontender. Fullness right mid abdomen. No hepatomegaly. Vascular: Trace bilateral pretibial edema. Neuro: Alert and oriented.  Skin: No rash. Port-A-Cath without erythema.    Lab Results:  Lab Results  Component Value Date   WBC 19.2* 11/04/2014   HGB 9.5* 11/04/2014   HCT 29.1* 11/04/2014   MCV 94.2 11/04/2014   PLT 394 11/04/2014   NEUTROABS 16.7* 11/04/2014    Imaging:  No results found.  Medications: I have reviewed the patient's current medications.  Assessment/Plan: 1.Adenocarcinoma of the small bowel, stage III (T3 N1), status post a partial small bowel resection 10/14/2013   The tumor returned microsatellite instability high with loss of MSH2 expression   MRI of the liver 05/28/2014 with multiple hepatic metastases and portal/retroperitoneal lymphadenopathy, ultrasound-guided biopsy of the liver 05/31/2014 confirmed metastatic small bowel carcinoma   Cycle 1 FOLFOX 06/08/2014  Cycle 2 FOLFOX  with Neulasta support 06/29/2014  Cycle 3 FOLFOX 07/13/2014  Cycle 4 FOLFOX 08/03/2014  Cycle 5 FOLFOX 08/22/2014  Restaging CT 09/02/2014 revealed progression of abdominal lymphadenopathy and peritoneal carcinomatosis  Cycle 1 Pembrolizumab 09/13/2014  Cycle 2 Pembrolizumab 10/04/2014  CT abdomen/pelvis 10/13/2014 showed no significant change in liver metastases, increased lymphadenopathy in the abdomen and pelvis and increase in abdominal and pelvic ascites. 2. Colon cancer X3, 1997 and 1999, status post an intra-abdominal colectomy with ileorectal anastomosis in May 1999  3. History of Iron deficiency anemia  4. Diabetes  5. 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 Hypertension  7. Family history of colon cancer-he has hereditary non-polyposis colon cancer syndrome  8. 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 Ascites-likely secondary to portal hypertension versus carcinomatosis, repeat peritoneal fluid cytologies negative for tumor cells  10. 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 Right neck pain-most likely secondary to a benign musculoskeletal condition, CT of the neck 06/06/2014 with degenerative changes, no evidence of metastatic disease  12. Renal failure-likely related to carcinomatosis and hepatic metastases, he saw nephrology 06/07/2014, improved  13. Nausea and vomiting following cycle 1 FOLFOX 14. Neutropenia following cycle one FOLFOX, Neulasta added with cycle 2. He declined further Neulasta. 15. 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 History of Hypokalemia-likely secondary to diarrhea 17.  Pain-likely secondary to metastatic lymphadenopathy and carcinomatosis, or tumor flare following pembrolizumab-improved 18.   Deconditioning 19. Hospitalization 10/13/2014 through 10/20/2014 with poorly controlled pain. The pain was felt to likely be secondary to metastatic lymphadenopathy and carcinomatosis or alternately tumor flare following pembrolizumab. The pain was improved at time of discharge. 20. Elevated ammonia level during the recent hospitalization. Lactulose discontinued 10/25/2014 due to diarrhea.   Disposition: [name] continues to have a poor performance status but does appear a little stronger. He will continue working with physical therapy and occupational therapy. He will begin Megace for his appetite. Prescriptions were refilled for MS Contin and Percocet at today's visit. He will return for a follow-up visit and cycle 3 pembrolizumab on 11/15/2014. He will contact the office in the interim with any problems.  Patient seen with Dr. Sherrill.    ,  ANP/GNP-BC   11/04/2014  11:00 AM  This was a shared visit with  . His performance status appears improved though he continues to have significant anorexia. He will begin Megace. The plan is to continue pembrolizumab.  Brad Sherrill, M.D.       

## 2014-11-04 NOTE — Patient Instructions (Signed)

## 2014-11-07 ENCOUNTER — Telehealth: Payer: Self-pay | Admitting: *Deleted

## 2014-11-07 NOTE — Telephone Encounter (Signed)
Call received from spouse requesting to talk with Symptom, management, collaborative nurse.  "I really would like to talk with Dr. Benay Spice about 'next steps, Megace, Hospice referral' and he can call after hours if needed."  Return call is [phone] for Amal Saiki.  Will print for Dr. Benay Spice review.

## 2014-11-08 ENCOUNTER — Encounter: Payer: Self-pay | Admitting: Oncology

## 2014-11-08 ENCOUNTER — Telehealth: Payer: Self-pay | Admitting: *Deleted

## 2014-11-08 NOTE — Telephone Encounter (Signed)
Dean Owens with [employer] "returning call from [facility] about how to".  Reports she has "faxed Scientist, clinical (histocompatibility and immunogenetics).  Need to complete the provider page of the application, send a script and treatment dates for patient."  Return call number [phone] opt. 2.  Called financial advocates and collaborative with this information as Dean Owens did not indicate who's call she is returning.

## 2014-11-08 NOTE — Progress Notes (Signed)
I placed provider form on desk of Dr. Gearldine Shown nurse for [employer] asst for [employer].

## 2014-11-08 NOTE — Progress Notes (Signed)
Called [employer] and spoke with Vicky 800 [phone] and she said needed new form for Keytruda. She said he is approved but thought new dr???

## 2014-11-09 ENCOUNTER — Other Ambulatory Visit: Payer: Self-pay

## 2014-11-09 ENCOUNTER — Encounter: Payer: Self-pay | Admitting: Oncology

## 2014-11-09 ENCOUNTER — Other Ambulatory Visit: Payer: Medicare Other

## 2014-11-09 ENCOUNTER — Encounter: Payer: Medicare Other | Admitting: Nurse Practitioner

## 2014-11-09 ENCOUNTER — Telehealth: Payer: Self-pay | Admitting: *Deleted

## 2014-11-09 ENCOUNTER — Telehealth: Payer: Self-pay

## 2014-11-09 ENCOUNTER — Other Ambulatory Visit: Payer: Self-pay | Admitting: *Deleted

## 2014-11-09 DIAGNOSIS — C179 Malignant neoplasm of small intestine, unspecified: Secondary | ICD-10-CM

## 2014-11-09 DIAGNOSIS — C189 Malignant neoplasm of colon, unspecified: Secondary | ICD-10-CM

## 2014-11-09 DIAGNOSIS — C61 Malignant neoplasm of prostate: Secondary | ICD-10-CM

## 2014-11-09 NOTE — Telephone Encounter (Signed)
Wife is calling to let Dr. Benay Spice know that her husband will be in at 3pm today for lab work (Call back is [phone] if needed).  Wife states that Dr. Benay Spice called them yesterday and requested that he come in today, however they were not sure at that time if her could. . Let Amy Horton RN for Dr.Sherrill know that patient is coming at 3pm today for lab.

## 2014-11-09 NOTE — Telephone Encounter (Signed)
He needs labs as ordered 3/16 He can be switched to oxycodone elixir.  Need to be seen at cancer center.  Can he come in to see [facility] 3/17

## 2014-11-09 NOTE — Telephone Encounter (Signed)
Received call from [place], [place] @ [facility] wanting to inform Dr. Benay Spice re:  Home health nurse visited pt today -  Vital signs ok; however, nurse noted  pt had symptoms of dehydration; unable to swallow oxycodone tablets; pt has not had BMs since Sunday. Caitlyn wanted to know if Dr. Benay Spice would consider switching Oxycodone to liquid forms, and order IVF at home - [facility] nurse can administer IVF for pt. Message routed to Dr. Benay Spice and Amy, desk nurse today. Caitlyn's  Phone    [phone]

## 2014-11-09 NOTE — Telephone Encounter (Signed)
S/w cynthia that we will set up an appt with [facility] when he gets here for labs. She is not sure he can walk down back steps. She will call us if he cannot. Discussed calling 911 if unable to get here. Discussed that home health could draw labs but we would not get results until tomorrow and if we need results more urgently he would need to be here.

## 2014-11-09 NOTE — Progress Notes (Signed)
Faxing forms back to [place]. [phone]

## 2014-11-09 NOTE — Telephone Encounter (Signed)
Patient's wife called to say he cannot come in for labs today.  They would like for Dr. Benay Spice to give an order to have [place] draw the labs from his Port-a-Cath.  Spoke with Dr. Benay Spice and he is in agreement.  Called Caitlin at [facility] and gave her verbal orders to access his Port, draw the CBC with diff, CMET, cortisol, ammonia and TSH.  They will draw those labs this evening and fax to Dr. Benay Spice.

## 2014-11-10 ENCOUNTER — Telehealth: Payer: Self-pay | Admitting: *Deleted

## 2014-11-10 ENCOUNTER — Other Ambulatory Visit: Payer: Self-pay | Admitting: *Deleted

## 2014-11-10 MED ORDER — OXYCODONE HCL 20 MG/ML PO CONC: 5.0000 mg | ORAL | Status: AC | PRN

## 2014-11-10 NOTE — Telephone Encounter (Signed)
Dean Owens called and has admitted patient.  Discussed DNR status and wold like DNR order signed.  Also would like to know status of oxyfast and can MS Contin 10 mg be increased to q 12 hour.    Verbal order received and read back from Dr. Benay Spice for DNR signed by Hospice provider, oxyfast signed earlier today this nurse will fax so wife doesn't have to drive  And the MS Contin can be discontinued due to trouble swallowing.  Dean Owens given these orders at this time.   Script will be faxed to [facility] at [facility].

## 2014-11-10 NOTE — Telephone Encounter (Signed)
HOSPICE HAS PICKED UP PT.'S CARE THIS AFTERNOON. PT. REFUSED THE LAB DRAW BY [facility]. THIS NOTE WAS ROUTED TO DR.Pondera.

## 2014-11-10 NOTE — Telephone Encounter (Signed)
Spoke with Dean Owens to see if patient could come in sometime today and be evaluated as Dr.Sherrill is concerned about him.  Dean Owens says he is too weak to come in today.  She thinks it would be good to change his medicine to liquid oxycodone and she can come and pick it up. [facility] did not come last night to draw labs Discussed with Dr. Benay Spice.  He thinks patient needs to be assessed today.  If patient is too weak to come to office, then he needs to go to [facility] ED.  Otherwise, the third option would be to do a Hospice Referral and possibly have them come out today.  Called Dean Owens back and discussed each option and she said she actually was planning on calling Hospice today, so she would prefer the third option and place him with Hospice. [facility] @ [place] and spoke with Jocelyn Lamer.  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  Dr. Benay Spice would like Hospice to address Code Status with him as well as evaluate him for [facility] placement.  Jocelyn Lamer said they could arrange for someone to go out to the home today. Discussed labs with Dr. Benay Spice and he would still like [facility] to do the same labs this morning - CBC/diff. CMet,cortisol, ammonia and TSH.  Dean Owens states she has already called [facility] this morning and they will be out to draw the labs.  Also let her know that Dr. Benay Spice will do the prescription for the liquid oxycodone and that it should be available in about an hour or so and she states that is no problem and she will come and pick up this script.

## 2014-11-11 ENCOUNTER — Telehealth: Payer: Self-pay | Admitting: *Deleted

## 2014-11-11 NOTE — Telephone Encounter (Signed)
Message from pt's wife requesting to cancel 3/22 appointment. She stated they have decided against labs at this time. Will make Dr. Benay Spice aware.

## 2014-11-13 ENCOUNTER — Other Ambulatory Visit: Payer: Self-pay | Admitting: Oncology

## 2014-11-15 ENCOUNTER — Other Ambulatory Visit: Payer: Medicare Other

## 2014-11-15 ENCOUNTER — Encounter: Payer: Self-pay | Admitting: Oncology

## 2014-11-15 ENCOUNTER — Telehealth: Payer: Self-pay | Admitting: *Deleted

## 2014-11-15 ENCOUNTER — Ambulatory Visit: Payer: Medicare Other | Admitting: Nurse Practitioner

## 2014-11-15 ENCOUNTER — Ambulatory Visit: Payer: Medicare Other

## 2014-11-15 NOTE — Progress Notes (Signed)
I called [employer] and spoke with Charlena Cross to let Olegario Shearer know to close the case for asst for this patient. Vicky had left me a message to send a scrip. They will withdraw/close.

## 2014-11-15 NOTE — Telephone Encounter (Signed)
Message from [place], [facility] RN with update on pt: Wife reports he is sleeping about 20 hrs/ day. Has been asleep for both hospice RN visits. Having difficulty swallowing pills. Wife requested to discontinue Marinol,Prevacid and iron. Will update med list and make Dr. Benay Spice aware.

## 2014-11-16 ENCOUNTER — Encounter: Payer: Self-pay | Admitting: Oncology

## 2014-11-16 NOTE — Progress Notes (Signed)
I called and spoke with Jenny Reichmann to let her know I faxed medical notes from 06/26/14-present to [facility]  [phone]

## 2014-11-16 NOTE — Progress Notes (Signed)
Patient wife Jenny Reichmann had left a message. I called her back and left a message to call me back and have [employer] forms sent to me or get me a fax# to send medical records.

## 2014-11-29 NOTE — Progress Notes (Signed)
This encounter was created in error - please disregard.

## 2014-12-06 ENCOUNTER — Ambulatory Visit: Payer: Medicare Other

## 2014-12-08 ENCOUNTER — Telehealth: Payer: Self-pay | Admitting: *Deleted

## 2014-12-08 DIAGNOSIS — C179 Malignant neoplasm of small intestine, unspecified: Secondary | ICD-10-CM

## 2014-12-08 MED ORDER — HALOPERIDOL LACTATE 2 MG/ML PO CONC: ORAL | Status: AC

## 2014-12-08 NOTE — Telephone Encounter (Signed)
Clifton James RN with Hospice called requesting refill for Haloperidol started 12-02-2014 by Hospice provider.  Haldol suspension 2 mg/ml, take 4 to 6 mg every 4 hours as needed.  Send to [facility] because their pharmacy does not stock this."

## 2014-12-21 ENCOUNTER — Telehealth: Payer: Self-pay

## 2014-12-21 NOTE — Telephone Encounter (Signed)
Ad[place] called to inform Dr Benay Spice that pt expired 01-10-2015 at 655am.

## 2014-12-25 DEATH — deceased

## 2015-04-13 IMAGING — CT CT ABD-PELV W/ CM
1 of 3 series · 14 of 32 positions shown, 19 images · IV contrast (OMNIPAQUE 300)
Comparison: CT abdomen and pelvis 09/02/2014 and 05/26/2014.

CLINICAL DATA: Mid abdominal pain and diarrhea beginning
10/12/2014. History of metastatic colon cancer

EXAM:
CT ABDOMEN AND PELVIS WITH CONTRAST
TECHNIQUE: Multidetector CT imaging of the abdomen and pelvis was performed
using the standard protocol following bolus administration of
intravenous contrast.
CONTRAST:  25 mL OMNIPAQUE IOHEXOL 300 MG/ML SOLN, 100 mL OMNIPAQUE
IOHEXOL 300 MG/ML SOLN

[Series 2: abd/pel with · axial · 0.79mm/px · z∈[+1038,+1488]mm · 14 of 102 slices shown, 19 images]
[im 6/102  soft-tissue]
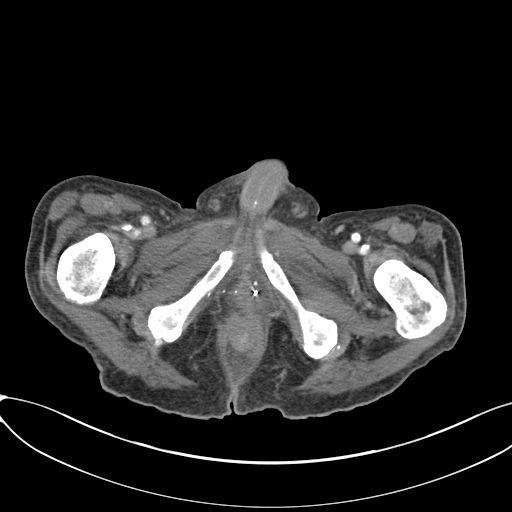
[im 6/102  bone]
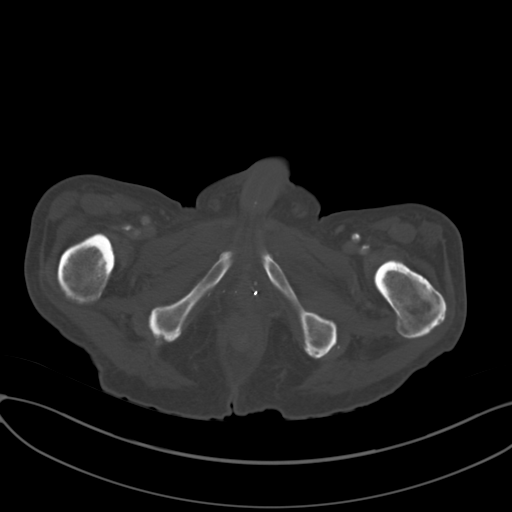
[im 16/102  soft-tissue]
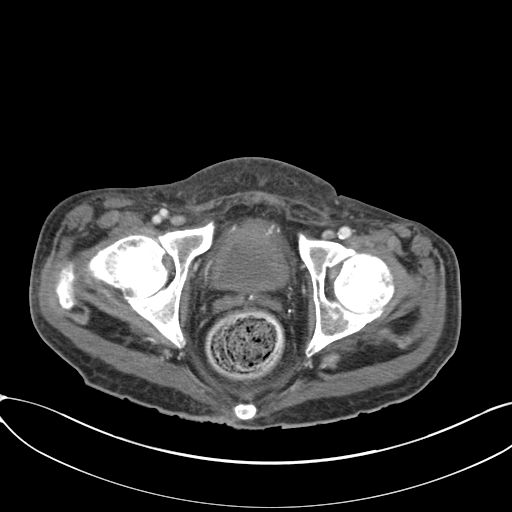
[im 21/102  soft-tissue]
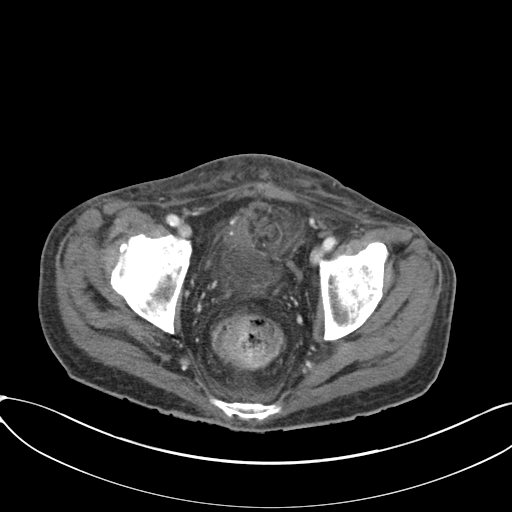
[im 31/102  soft-tissue]
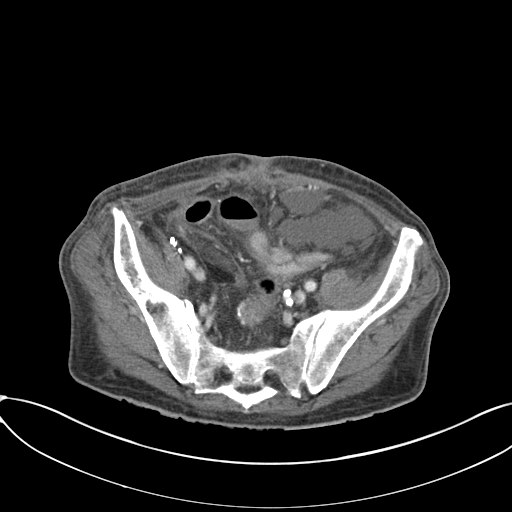
[im 36/102  soft-tissue]
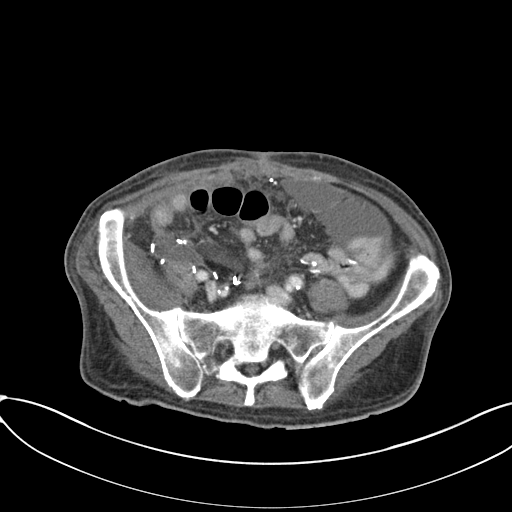
[im 46/102  soft-tissue]
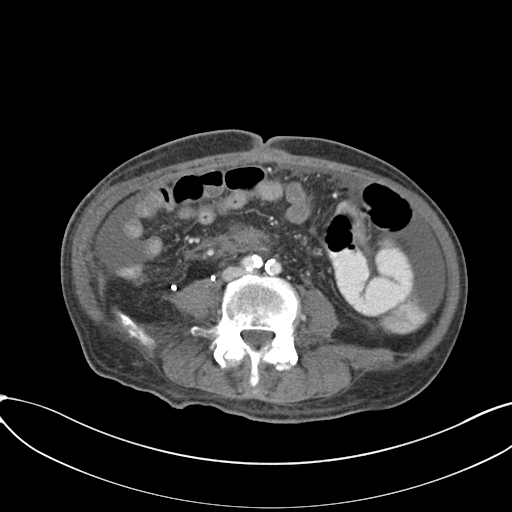
[im 51/102  soft-tissue]
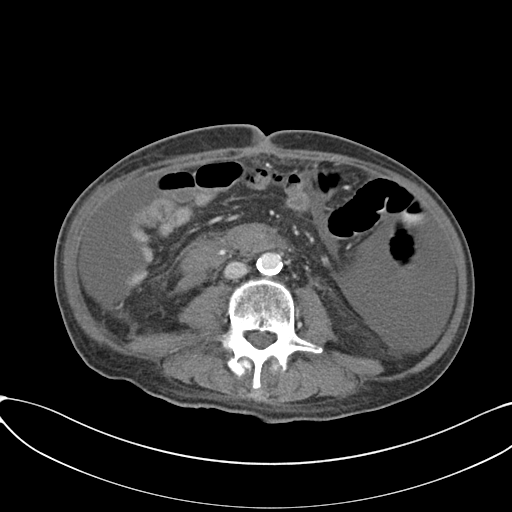
[im 56/102  soft-tissue]
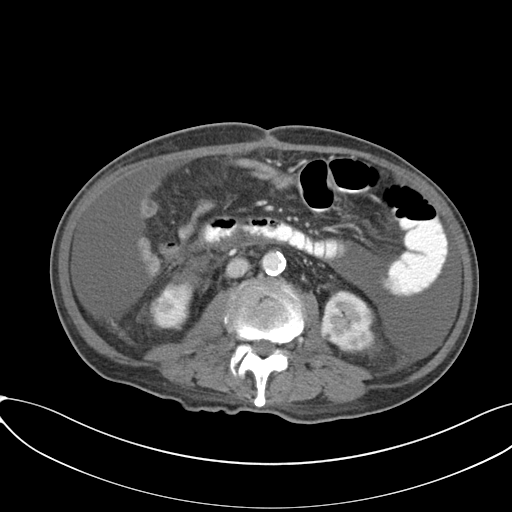
[im 66/102  soft-tissue]
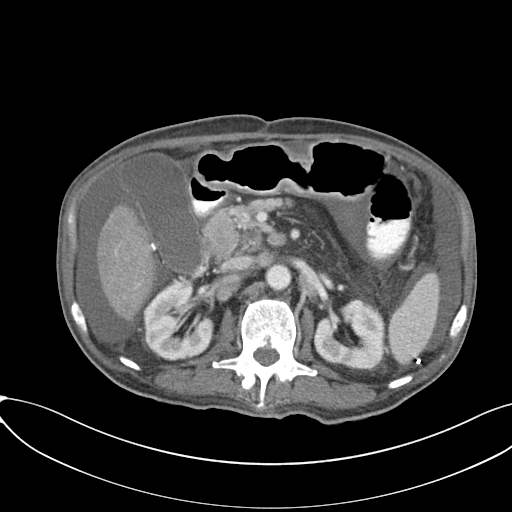
[im 66/102  bone]
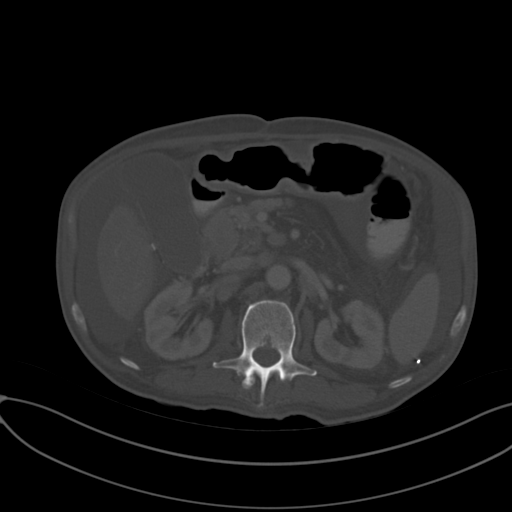
[im 71/102  soft-tissue]
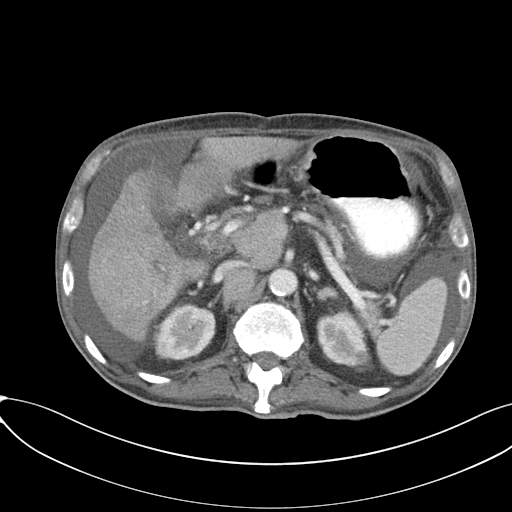
[im 81/102  soft-tissue]
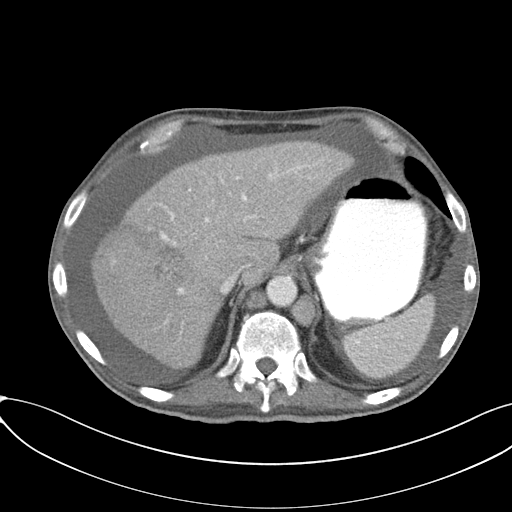
[im 81/102  lung]
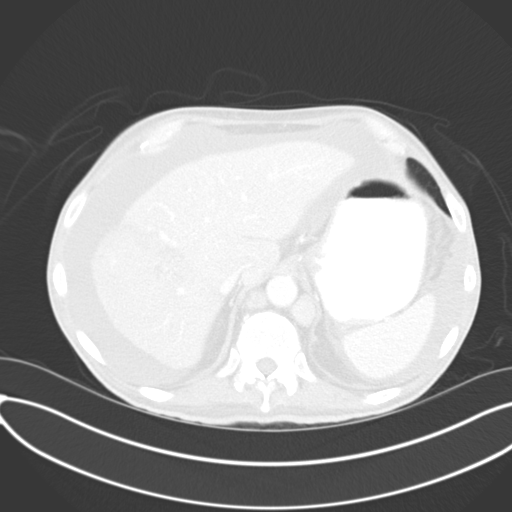
[im 86/102  soft-tissue]
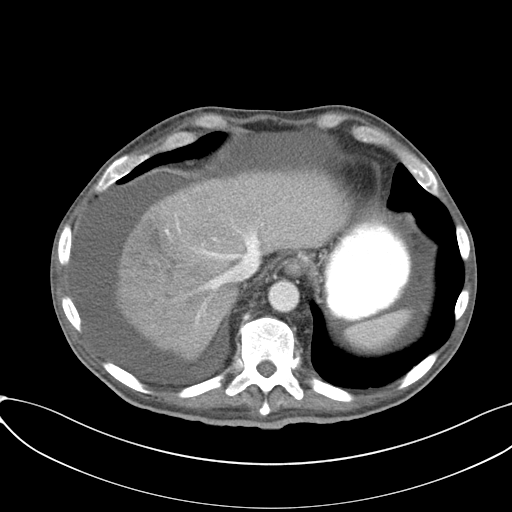
[im 86/102  lung]
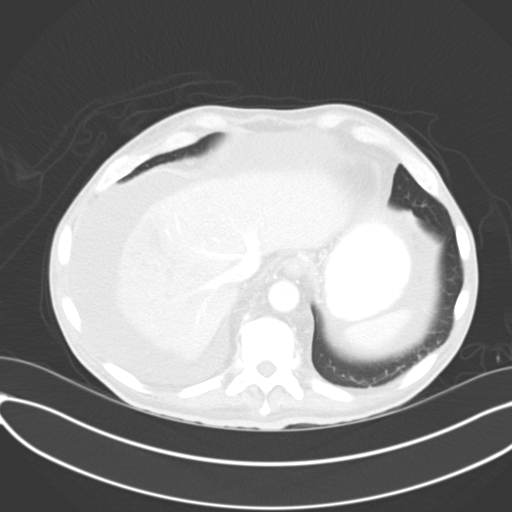
[im 91/102  lung]
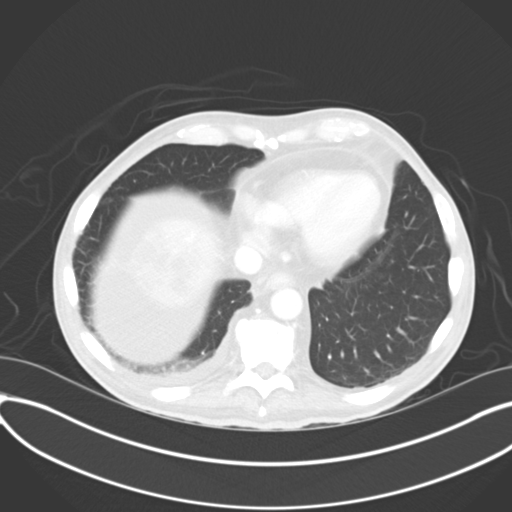
[im 96/102  soft-tissue]
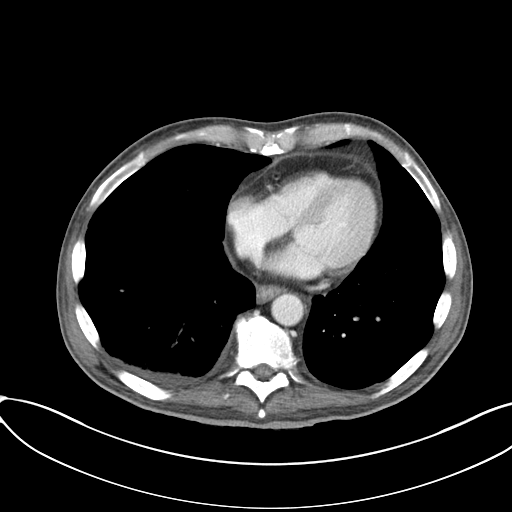
[im 96/102  lung]
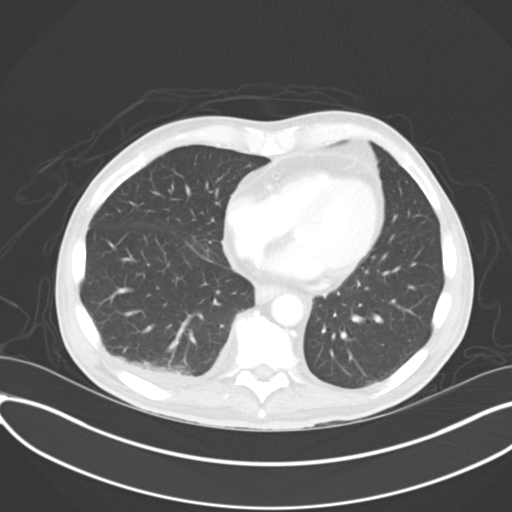

[14 of 32 positions shown; findings below may reference images not displayed]

FINDINGS: The patient has a new very small right pleural effusion. No left
pleural effusion is present and no pericardial effusion is
identified.

As on the prior examination, multiple mass lesions are seen in the
liver consistent with metastatic disease. Index lesion in the right
hepatic lobe which had measured 12.2 x 8.7 cm today measures 12.7 x
7.6 cm on image 24. Difference in measurement is likely due to lack
of contrast on the prior examination. The spleen, adrenal glands,
pancreas and kidneys are unremarkable.

There is a moderate volume of abdominal and pelvic ascites which is
increased somewhat since the prior study. Extensive abdominal
lymphadenopathy is again seen. Omental and mesenteric nodularity is
again identified.

Index porta hepatis noted which had measured 3.2 x 4.9 cm today
measures 4.0 x 4.8 cm on image 31.

Left periaortic node which had measured 2.9 x 2.5 cm today measures
2.9 x 2.8 cm on image 40.

Mass in the central mesentery anterior to the aorta which had
measured 3.4 x 1.9 cm today measures 4.2 x 2.8 cm on image 52.

The patient is status post colectomy and Hartmann's pouch formation.
There is no evidence of bowel obstruction.

No lytic or sclerotic bony lesion is identified.
IMPRESSION: Progression of metastatic disease in the abdomen and pelvis with
increased lymphadenopathy identified. There has also been increase
in abdominal and pelvic ascites which now appears moderate. Hepatic
metastases are not notably change.

New small right pleural effusion.

Status post colectomy.

## 2015-04-17 IMAGING — US US ABDOMEN LIMITED
1 series · 14 of 25 positions shown · non-contrast
Comparison: CT, 10/13/2014.

CLINICAL DATA: Abdominal pain and diarrhea starting 5 days ago.
Right upper quadrant pain for 7 months. History of squamous cell
carcinoma and colon carcinoma as well as hypertension and diabetes.
Known metastatic disease based on the prior CT scan report.

EXAM:
US ABDOMEN LIMITED - RIGHT UPPER QUADRANT

[Series 1: us abdomen limited · 0.24mm/px · 14 of 48 slices shown]
[im 1/48]
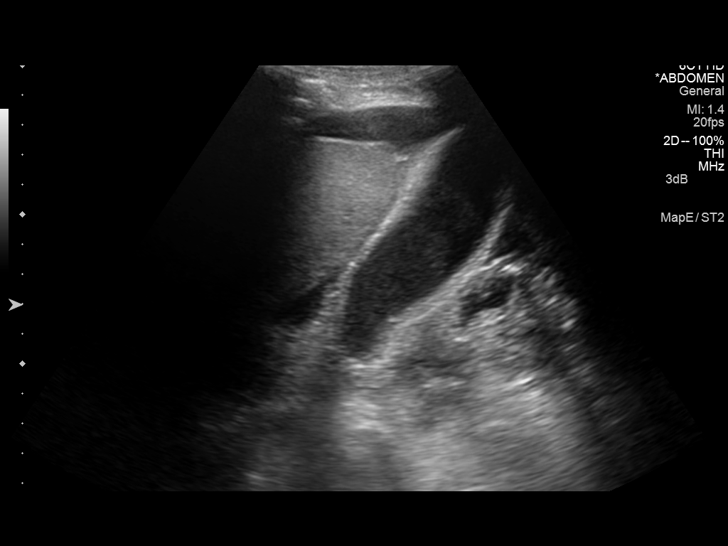
[im 4/48]
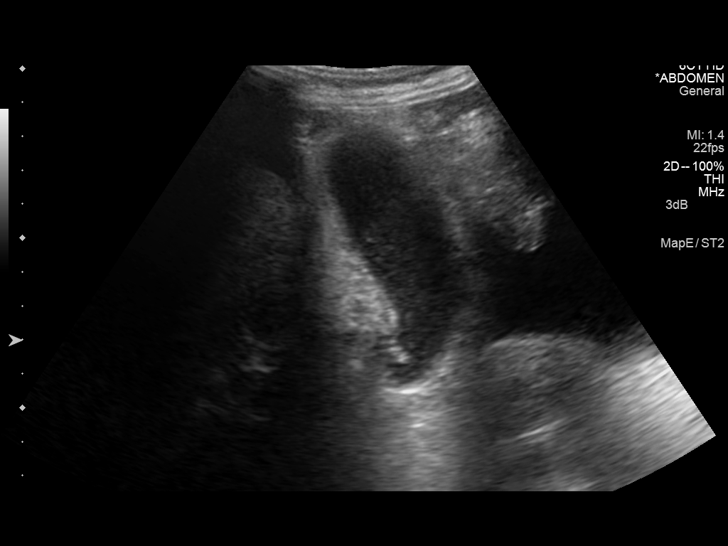
[im 8/48]
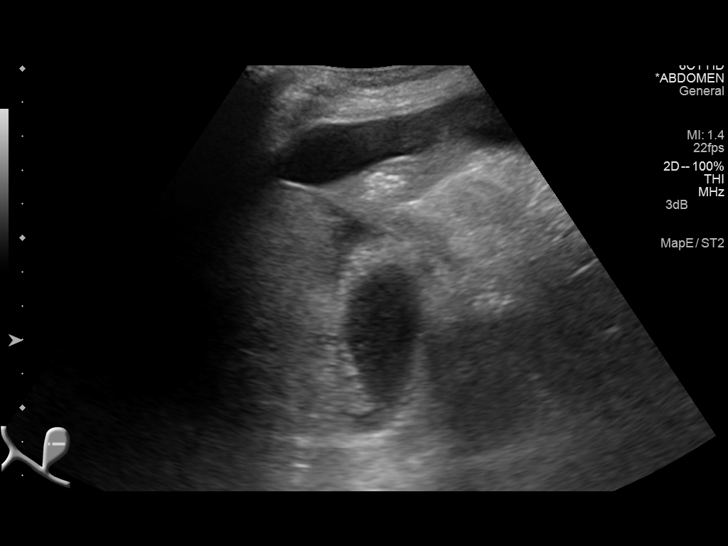
[im 12/48]
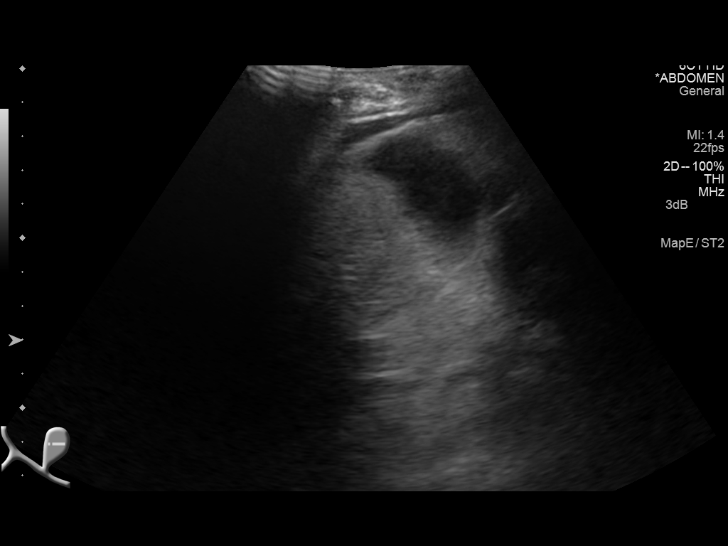
[im 16/48]
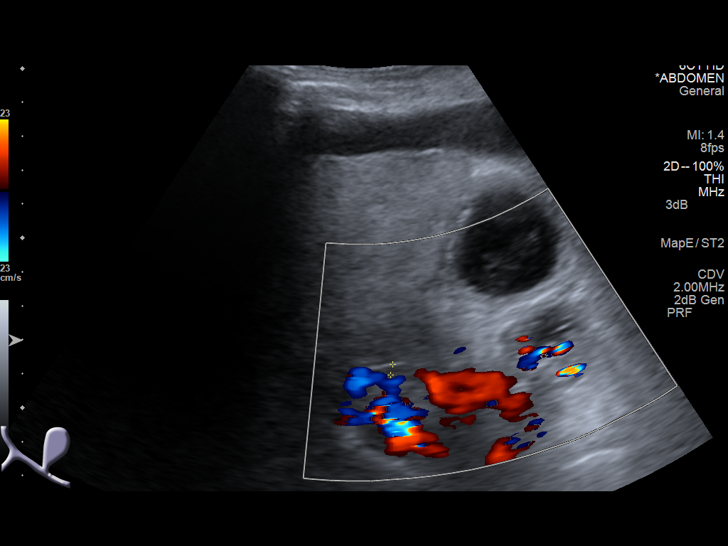
[im 18/48]
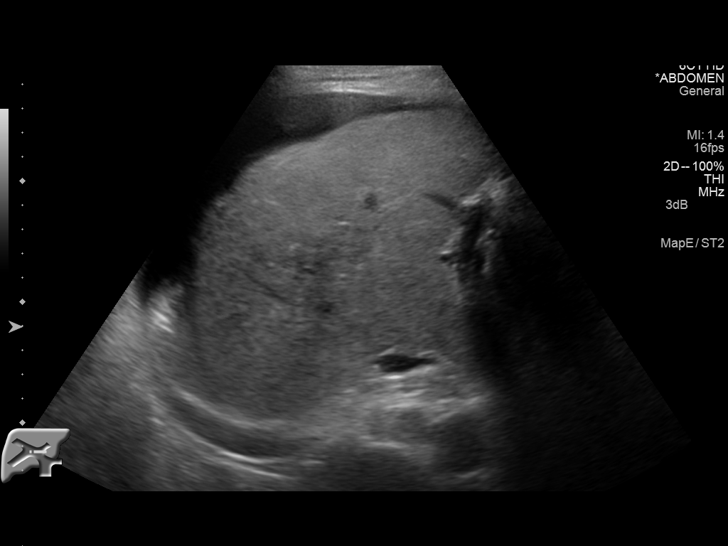
[im 22/48]
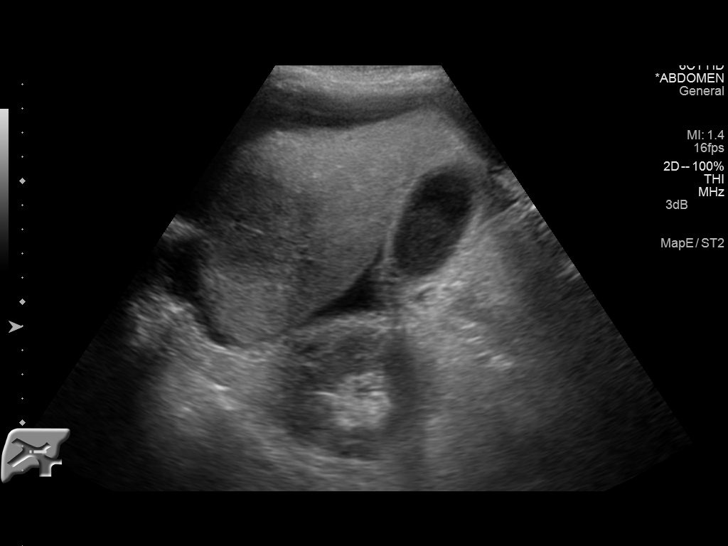
[im 26/48]
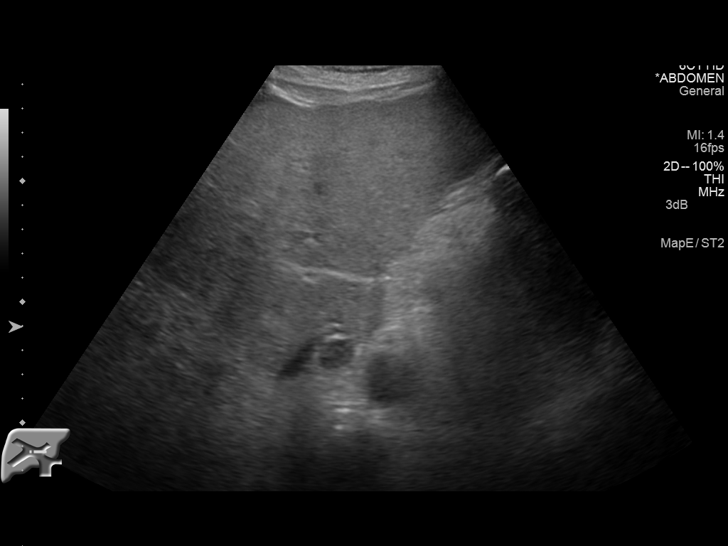
[im 30/48]
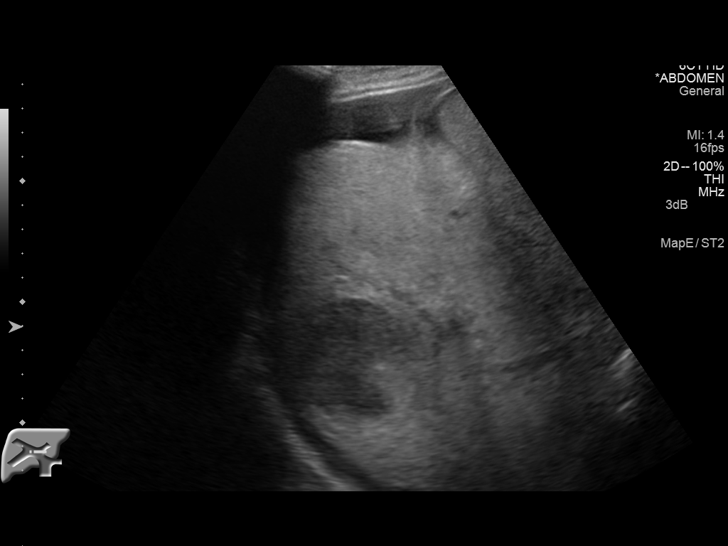
[im 32/48]
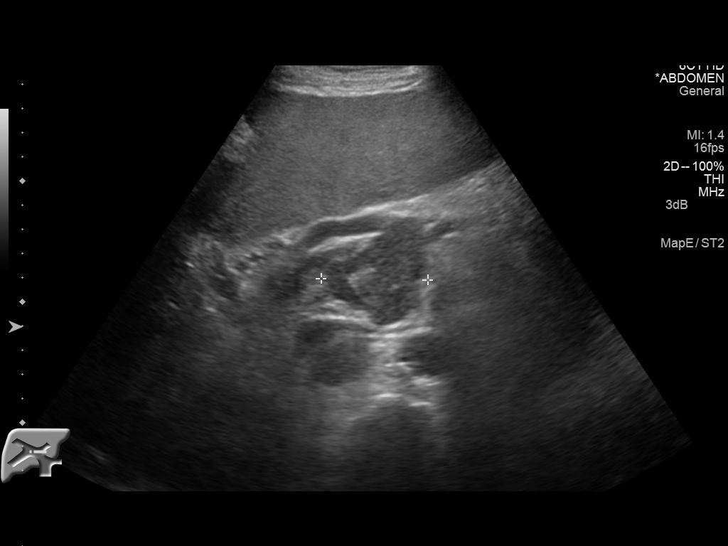
[im 36/48]
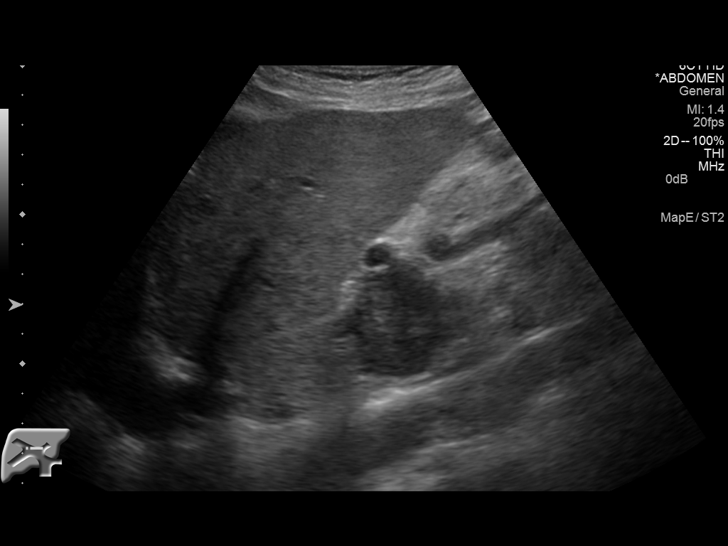
[im 40/48]
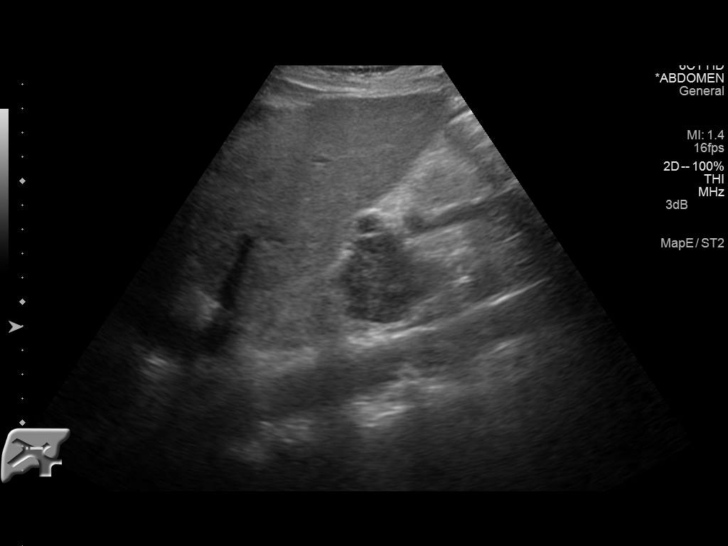
[im 44/48]
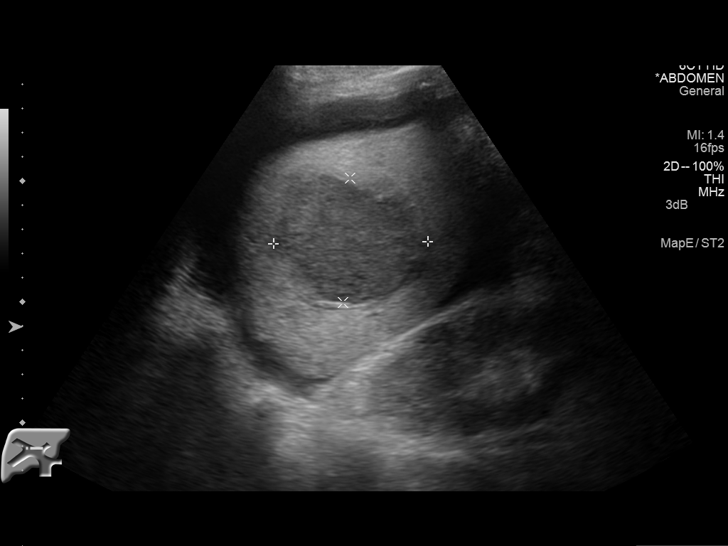
[im 48/48]
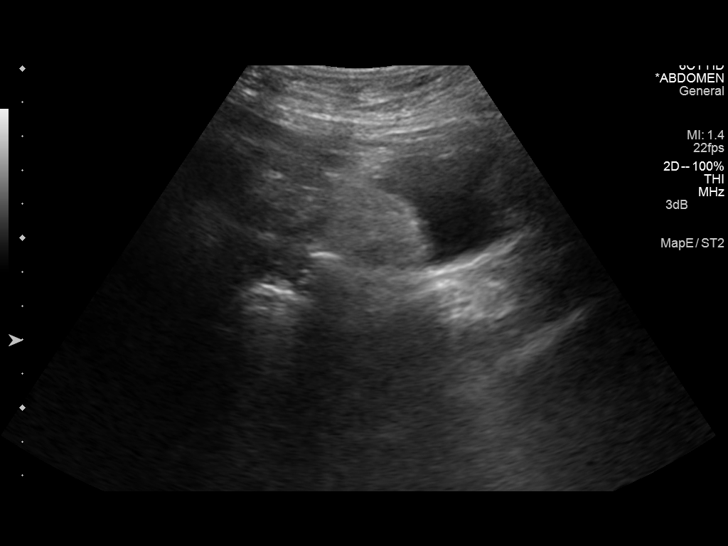

[14 of 25 positions shown; findings below may reference images not displayed]

FINDINGS: Gallbladder:

Sludge. No shadowing stones. No wall thickening. No evidence of
acute cholecystitis.

Common bile duct:

Diameter: 3.2 mm.

Liver:

Right liver mass measuring 6.4 cm x 5.2 cm x 5.6 cm. No other liver
masses. Occluded portal vein based on color Doppler analysis.

Ascites. There is a midline mass measuring 4.6 cm reflecting the
confluent adenopathy noted on the recent CT.
IMPRESSION: 1. No acute findings. No evidence of acute cholecystitis. There is
gallbladder sludge but no shadowing stones.
2. Metastatic disease to the liver with a 6.4 cm right liver mass.
Metastatic adenopathy. A midline mobile mass measuring 4.6 cm is
noted. Ascites. These findings are stable from the recent CT.
